# Patient Record
Sex: Female | Born: 1990 | Race: White | Hispanic: No | Marital: Single | Smoking: Never smoker
Health system: Southern US, Community
[De-identification: ages and names within clinical notes are randomized; demographics above are authoritative.]

## PROBLEM LIST (undated history)

## (undated) DIAGNOSIS — B1920 Unspecified viral hepatitis C without hepatic coma: Secondary | ICD-10-CM

## (undated) DIAGNOSIS — B0229 Other postherpetic nervous system involvement: Secondary | ICD-10-CM

## (undated) DIAGNOSIS — J189 Pneumonia, unspecified organism: Secondary | ICD-10-CM

## (undated) DIAGNOSIS — A0472 Enterocolitis due to Clostridium difficile, not specified as recurrent: Secondary | ICD-10-CM

## (undated) DIAGNOSIS — Z21 Asymptomatic human immunodeficiency virus [HIV] infection status: Secondary | ICD-10-CM

## (undated) DIAGNOSIS — F4310 Post-traumatic stress disorder, unspecified: Secondary | ICD-10-CM

## (undated) DIAGNOSIS — A5901 Trichomonal vulvovaginitis: Secondary | ICD-10-CM

## (undated) DIAGNOSIS — F1990 Other psychoactive substance use, unspecified, uncomplicated: Secondary | ICD-10-CM

## (undated) DIAGNOSIS — J101 Influenza due to other identified influenza virus with other respiratory manifestations: Secondary | ICD-10-CM

## (undated) DIAGNOSIS — Z9289 Personal history of other medical treatment: Secondary | ICD-10-CM

## (undated) DIAGNOSIS — B20 Human immunodeficiency virus [HIV] disease: Secondary | ICD-10-CM

## (undated) DIAGNOSIS — B182 Chronic viral hepatitis C: Secondary | ICD-10-CM

## (undated) HISTORY — DX: Influenza due to other identified influenza virus with other respiratory manifestations: J10.1

## (undated) HISTORY — DX: Other psychoactive substance use, unspecified, uncomplicated: F19.90

## (undated) HISTORY — PX: PERIPHERALLY INSERTED CENTRAL CATHETER INSERTION: SHX2221

## (undated) HISTORY — DX: Chronic viral hepatitis C: B18.2

## (undated) HISTORY — DX: Trichomonal vulvovaginitis: A59.01

## (undated) HISTORY — DX: Enterocolitis due to Clostridium difficile, not specified as recurrent: A04.72

## (undated) HISTORY — PX: TONSILLECTOMY AND ADENOIDECTOMY: SUR1326

## (undated) HISTORY — PX: APPENDECTOMY: SHX54

---
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
## 2009-04-09 ENCOUNTER — Encounter: Payer: Self-pay | Admitting: Obstetrics & Gynecology

## 2009-04-09 LAB — CONVERTED CEMR LAB
Basophils Relative: 0 % (ref 0–1)
Eosinophils Absolute: 0.1 10*3/uL (ref 0.0–0.7)
Eosinophils Relative: 2 % (ref 0–5)
HCT: 33.2 % — ABNORMAL LOW (ref 36.0–46.0)
Hepatitis B Surface Ag: NEGATIVE
Lymphs Abs: 0.6 10*3/uL — ABNORMAL LOW (ref 0.7–4.0)
MCHC: 32.8 g/dL (ref 30.0–36.0)
Monocytes Relative: 9 % (ref 3–12)
Neutro Abs: 3 10*3/uL (ref 1.7–7.7)
Neutrophils Relative %: 75 % (ref 43–77)

## 2009-04-10 ENCOUNTER — Ambulatory Visit: Payer: Self-pay | Admitting: Obstetrics & Gynecology

## 2009-05-06 ENCOUNTER — Ambulatory Visit (HOSPITAL_COMMUNITY): Admission: RE | Admit: 2009-05-06 | Discharge: 2009-05-06 | Payer: Self-pay | Admitting: Family Medicine

## 2009-06-06 ENCOUNTER — Ambulatory Visit: Payer: Self-pay | Admitting: Obstetrics and Gynecology

## 2009-06-27 ENCOUNTER — Encounter (INDEPENDENT_AMBULATORY_CARE_PROVIDER_SITE_OTHER): Payer: Self-pay | Admitting: *Deleted

## 2009-06-27 ENCOUNTER — Ambulatory Visit: Payer: Self-pay | Admitting: Family Medicine

## 2009-07-03 ENCOUNTER — Ambulatory Visit (HOSPITAL_COMMUNITY): Admission: RE | Admit: 2009-07-03 | Discharge: 2009-07-03 | Payer: Self-pay | Admitting: Obstetrics & Gynecology

## 2009-07-18 ENCOUNTER — Encounter: Payer: Self-pay | Admitting: Obstetrics & Gynecology

## 2009-07-18 ENCOUNTER — Ambulatory Visit: Payer: Self-pay | Admitting: Family Medicine

## 2009-09-12 ENCOUNTER — Encounter (INDEPENDENT_AMBULATORY_CARE_PROVIDER_SITE_OTHER): Payer: Self-pay | Admitting: *Deleted

## 2009-09-12 ENCOUNTER — Encounter: Payer: Self-pay | Admitting: Infectious Disease

## 2009-09-12 ENCOUNTER — Ambulatory Visit (HOSPITAL_COMMUNITY): Admission: RE | Admit: 2009-09-12 | Discharge: 2009-09-12 | Payer: Self-pay | Admitting: Obstetrics & Gynecology

## 2009-09-12 ENCOUNTER — Ambulatory Visit: Payer: Self-pay | Admitting: Obstetrics & Gynecology

## 2009-09-12 ENCOUNTER — Encounter: Payer: Self-pay | Admitting: Physician Assistant

## 2009-09-13 ENCOUNTER — Encounter: Payer: Self-pay | Admitting: Physician Assistant

## 2009-09-13 LAB — CONVERTED CEMR LAB
HIV 1 RNA Quant: 14100 copies/mL — ABNORMAL HIGH (ref <48)
HIV-1 RNA Quant, Log: 4.15 — ABNORMAL HIGH (ref <1.68)
Platelets: 252 10*3/uL (ref 150–400)
RDW: 18.2 % — ABNORMAL HIGH (ref 11.5–15.5)
WBC: 9.1 10*3/uL (ref 4.0–10.5)

## 2009-09-16 ENCOUNTER — Telehealth: Payer: Self-pay | Admitting: Infectious Disease

## 2009-09-17 ENCOUNTER — Ambulatory Visit: Payer: Self-pay | Admitting: Advanced Practice Midwife

## 2009-09-17 ENCOUNTER — Inpatient Hospital Stay (HOSPITAL_COMMUNITY): Admission: AD | Admit: 2009-09-17 | Discharge: 2009-09-18 | Payer: Self-pay | Admitting: Obstetrics & Gynecology

## 2009-09-19 ENCOUNTER — Ambulatory Visit: Payer: Self-pay | Admitting: Obstetrics & Gynecology

## 2009-09-19 LAB — CONVERTED CEMR LAB: Chlamydia, Swab/Urine, PCR: NEGATIVE

## 2009-09-26 ENCOUNTER — Telehealth: Payer: Self-pay | Admitting: Infectious Disease

## 2009-09-26 ENCOUNTER — Encounter: Payer: Self-pay | Admitting: Infectious Disease

## 2009-09-26 DIAGNOSIS — B20 Human immunodeficiency virus [HIV] disease: Secondary | ICD-10-CM

## 2009-10-03 ENCOUNTER — Inpatient Hospital Stay (HOSPITAL_COMMUNITY): Admission: RE | Admit: 2009-10-03 | Discharge: 2009-10-06 | Payer: Self-pay | Admitting: Obstetrics & Gynecology

## 2009-10-03 ENCOUNTER — Ambulatory Visit: Payer: Self-pay | Admitting: Obstetrics & Gynecology

## 2009-10-31 ENCOUNTER — Encounter (INDEPENDENT_AMBULATORY_CARE_PROVIDER_SITE_OTHER): Payer: Self-pay | Admitting: *Deleted

## 2010-02-23 ENCOUNTER — Encounter: Payer: Self-pay | Admitting: Obstetrics and Gynecology

## 2010-03-04 NOTE — Progress Notes (Signed)
Summary: pregnant  Phone Note Other Incoming   Summary of Call: Pt with HIV that was managed by PCP in [place] with atripla, then put on trizivir and then truvada ALONE since December of 2010. She is no pregnant and in the final trimester with due date in September. Her viral load was 14k when checkec by ob gyn. I aedkasked gyn to start combivir and [place] and have arranged for her to be seen in [place] next THursday. We will likely need to add raltegravir but I will let her adjust to combivir kaletra first (and this oculd be sufficient to control virus depending on exten of R present_ Initial call taken by: [name] MD,  September 17, 2009 5:38 PM

## 2010-03-04 NOTE — Progress Notes (Signed)
Summary: PT NEEDS GENOTYPE ADDED ON  Phone Note Outgoing Call   Call placed by: [name] MD,  September 26, 2009 12:39 PM Details for Reason: PT NEEDS GENOTYPE Summary of Call: PT WAS SUPPOSED TO HAVE GENOTYPE RUN ON BLOOD DRAWN IN OB IN [place] BUT WAS NOT DONE. I AM SEEING HER IN [place]. SHE IS PRGENANT AND IS ON INTIIAL COMBIVIR KALETRA HAVING BEEN INAPPROPRIATELY LEFT ON TRUVADA ALONE FOR HALF A YEAR. CAN WE ADD GENOTYPE TO BLOOD DRAWN IN PAST TWO WEEKS (SHE HAS A FROZEN SAMPLE AFTER HIV QUANT WAS RUN) AND I AM HAVING TROUBLE ORDERING IT OVER THE PHION Initial call taken by: [name] MD,  September 26, 2009 12:40 PM  New Problems: PREGNANCY, HIGH RISK (ICD-V23.9) HIV INFECTION (ICD-042)   New Problems: PREGNANCY, HIGH RISK (ICD-V23.9) HIV INFECTION (ICD-042)

## 2010-03-04 NOTE — Miscellaneous (Signed)
Summary: Flowsheet updated  Clinical Lists Changes  Observations: Added new observation of PLATELETK/UL: 252 K/uL (09/12/2009 11:07) Added new observation of WBC COUNT: 9.1 10*3/microliter (09/12/2009 11:07) Added new observation of HGB: 9.4 g/dL (91/47/8295 62:13)

## 2010-04-17 LAB — CROSSMATCH: Antibody Screen: NEGATIVE

## 2010-04-17 LAB — CBC
MCV: 72 fL — ABNORMAL LOW (ref 78.0–100.0)
Platelets: 223 10*3/uL (ref 150–400)
RDW: 21.3 % — ABNORMAL HIGH (ref 11.5–15.5)
WBC: 9.6 10*3/uL (ref 4.0–10.5)

## 2010-04-17 LAB — ABO/RH: ABO/RH(D): O POS

## 2010-04-17 LAB — COMPREHENSIVE METABOLIC PANEL
Albumin: 2 g/dL — ABNORMAL LOW (ref 3.5–5.2)
Alkaline Phosphatase: 99 U/L (ref 39–117)
BUN: 11 mg/dL (ref 6–23)
Potassium: 3.9 mEq/L (ref 3.5–5.1)
Total Protein: 5.1 g/dL — ABNORMAL LOW (ref 6.0–8.3)

## 2010-04-17 LAB — T-HELPER CELLS (CD4) COUNT (NOT AT ARMC)
CD4 % Helper T Cell: 46 % (ref 33–55)
CD4 T Cell Abs: 260 uL — ABNORMAL LOW (ref 400–2700)

## 2010-04-18 LAB — POCT URINALYSIS DIPSTICK
Bilirubin Urine: NEGATIVE
Bilirubin Urine: NEGATIVE
Glucose, UA: NEGATIVE mg/dL
Hgb urine dipstick: NEGATIVE
Ketones, ur: NEGATIVE mg/dL
Nitrite: NEGATIVE
Nitrite: NEGATIVE
pH: 7.5 (ref 5.0–8.0)

## 2010-04-18 LAB — MRSA CULTURE

## 2010-04-18 LAB — CBC
HCT: 27 % — ABNORMAL LOW (ref 36.0–46.0)
Hemoglobin: 9.3 g/dL — ABNORMAL LOW (ref 12.0–15.0)
MCV: 72.3 fL — ABNORMAL LOW (ref 78.0–100.0)
RBC: 3.73 MIL/uL — ABNORMAL LOW (ref 3.87–5.11)
WBC: 7.4 10*3/uL (ref 4.0–10.5)

## 2010-04-20 LAB — POCT URINALYSIS DIP (DEVICE)
Ketones, ur: NEGATIVE mg/dL
Nitrite: NEGATIVE
Protein, ur: 100 mg/dL — AB

## 2010-04-21 LAB — POCT URINALYSIS DIP (DEVICE)
Glucose, UA: NEGATIVE mg/dL
Ketones, ur: NEGATIVE mg/dL
Specific Gravity, Urine: 1.025 (ref 1.005–1.030)
Urobilinogen, UA: 1 mg/dL (ref 0.0–1.0)

## 2010-04-22 LAB — POCT URINALYSIS DIP (DEVICE)
Bilirubin Urine: NEGATIVE
Glucose, UA: NEGATIVE mg/dL
Hgb urine dipstick: NEGATIVE
Nitrite: NEGATIVE

## 2010-04-28 LAB — POCT URINALYSIS DIP (DEVICE)
Bilirubin Urine: NEGATIVE
Glucose, UA: NEGATIVE mg/dL
Ketones, ur: NEGATIVE mg/dL
Nitrite: NEGATIVE
pH: 6 (ref 5.0–8.0)

## 2010-09-22 IMAGING — US US OB COMP +14 WK
1 series · 14 of 28 positions shown · non-contrast
Comparison: none

OBSTETRICAL ULTRASOUND:
 This ultrasound exam was performed in the [HOSPITAL] Ultrasound Department.  The OB US report was generated in the AS system, and faxed to the ordering physician.  This report is also available in [HOSPITAL]?s AccessANYware and in [REDACTED] PACS.

[Series 1: us ob comp +14 wk · 0.21mm/px · 14 of 53 slices shown]
[im 2/53]
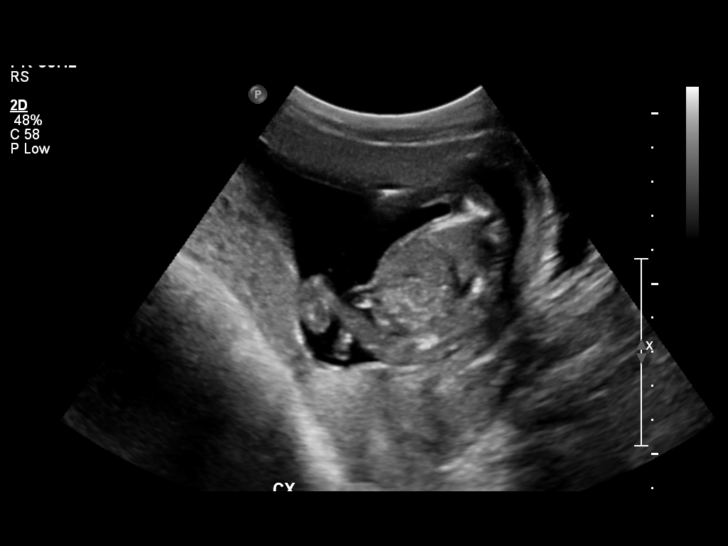
[im 6/53]
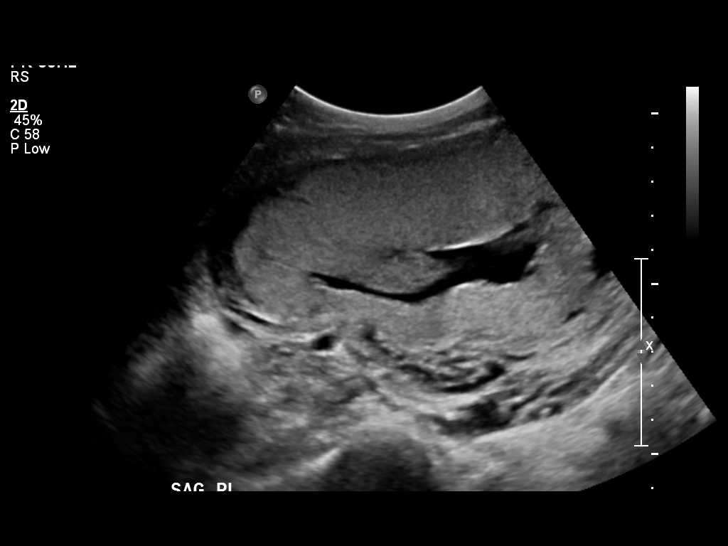
[im 10/53]
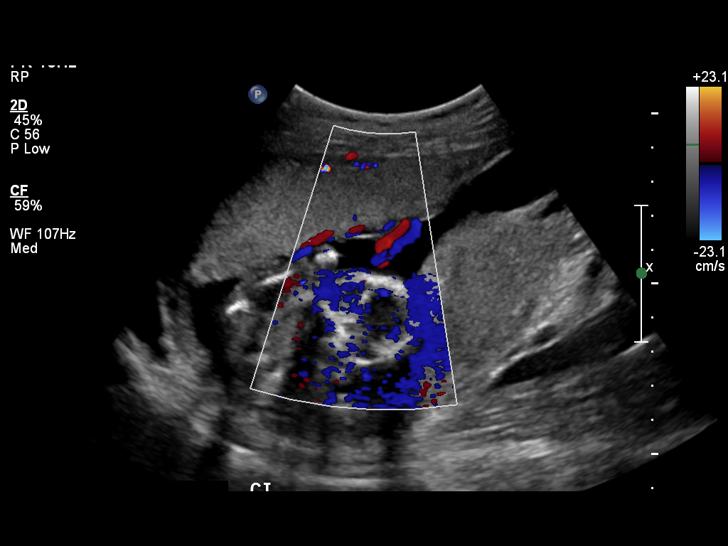
[im 14/53]
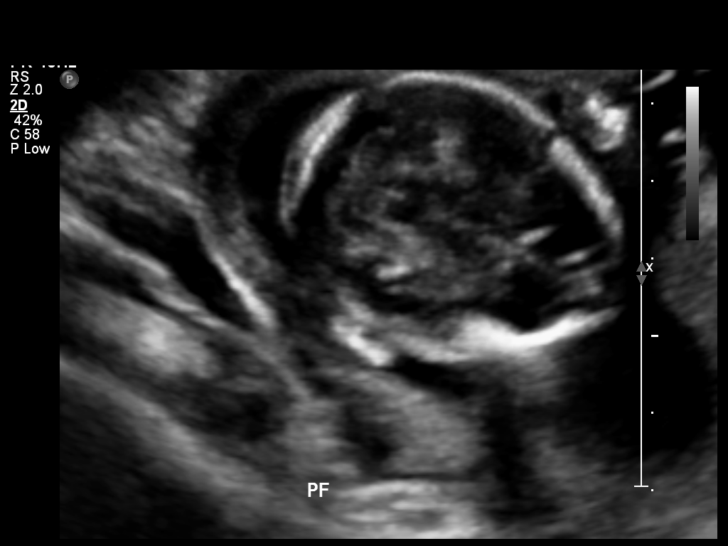
[im 18/53]
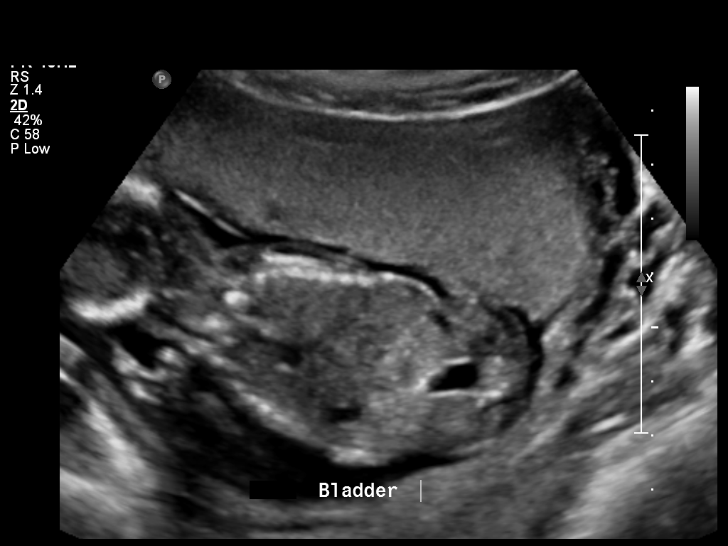
[im 22/53]
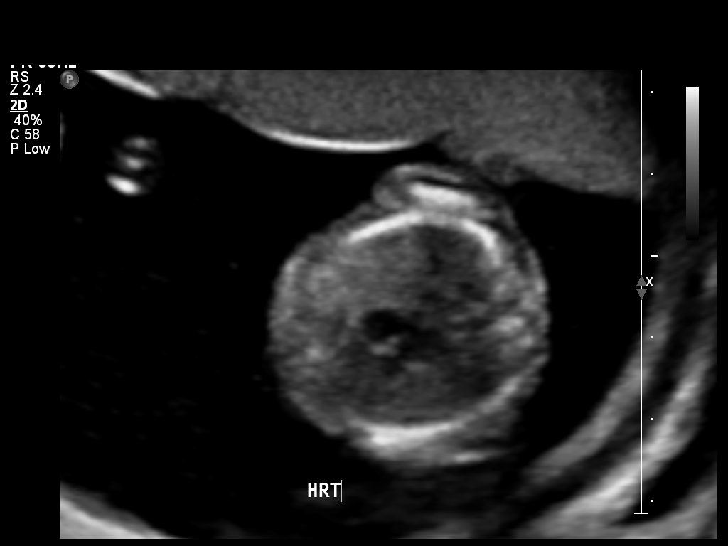
[im 26/53]
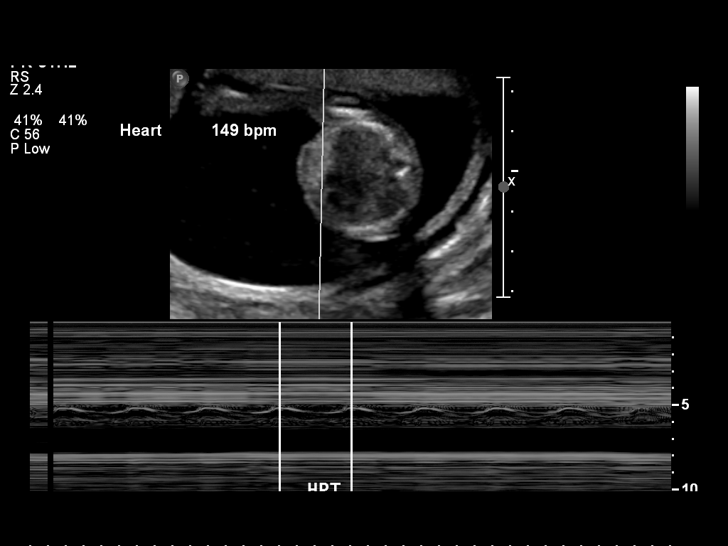
[im 29/53]
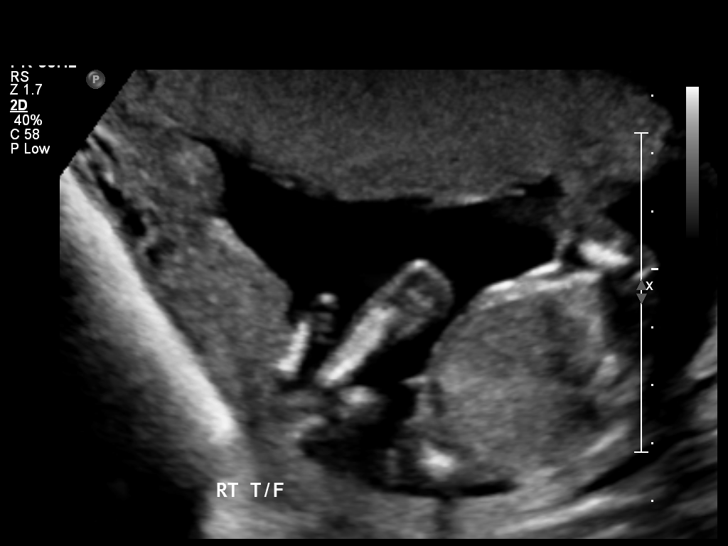
[im 33/53]
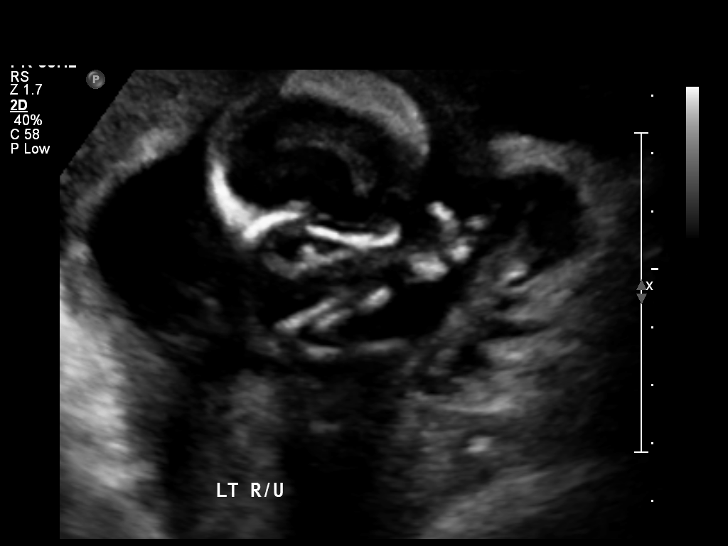
[im 37/53]
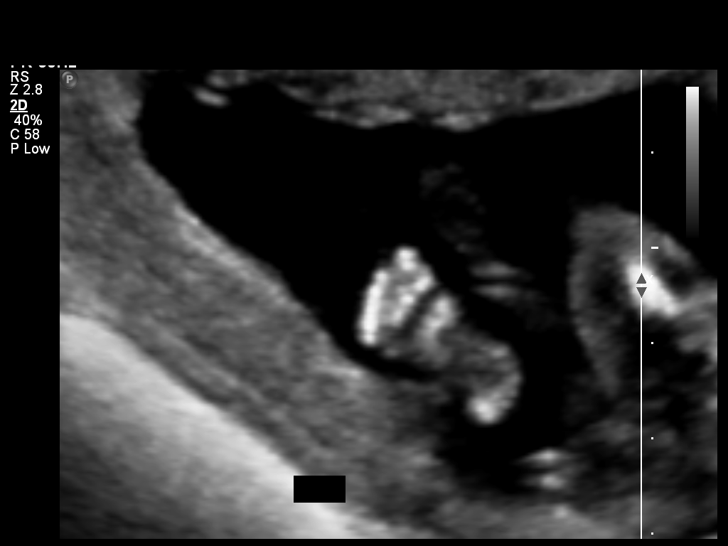
[im 41/53]
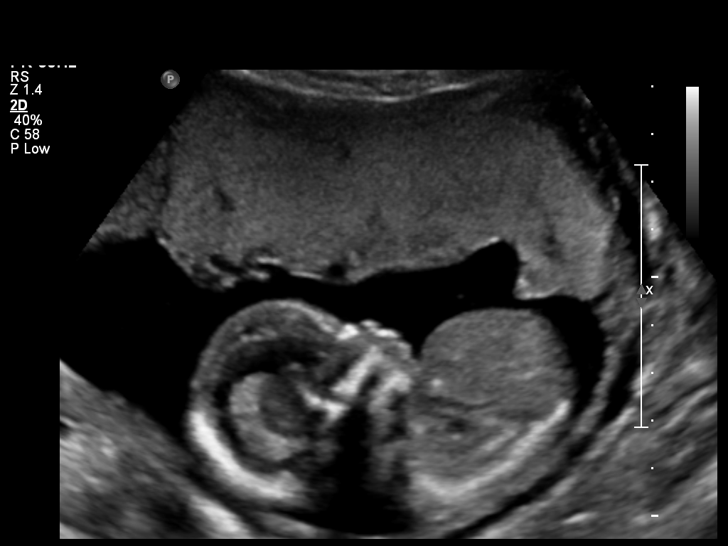
[im 45/53]
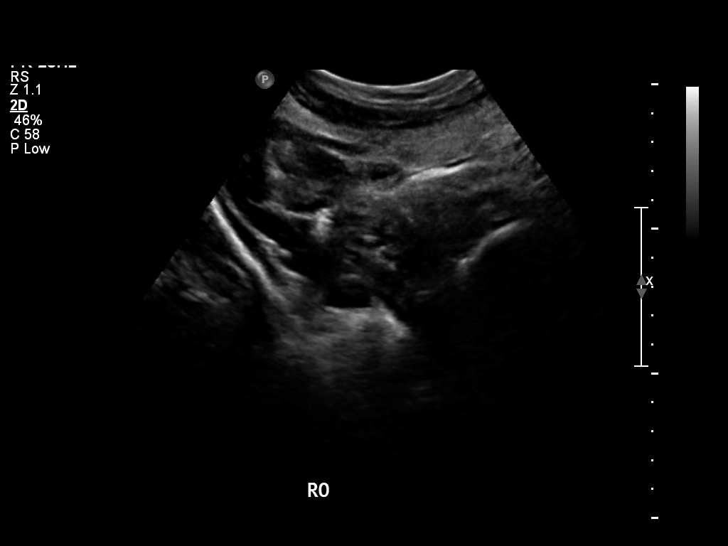
[im 49/53]
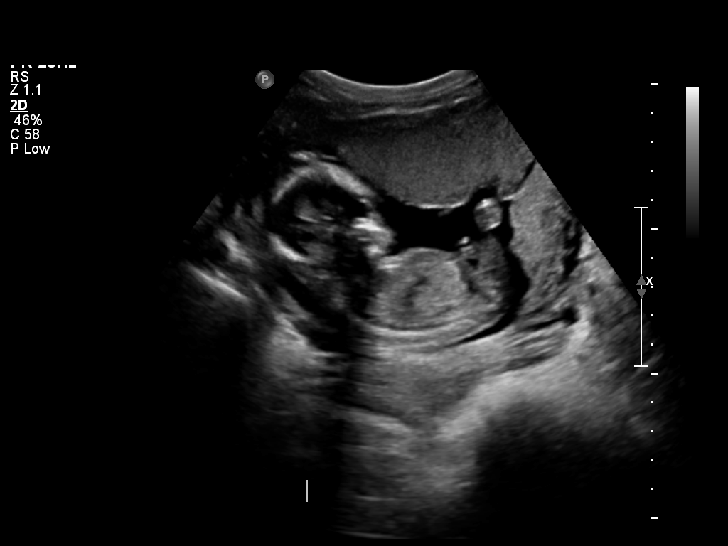
[im 53/53]
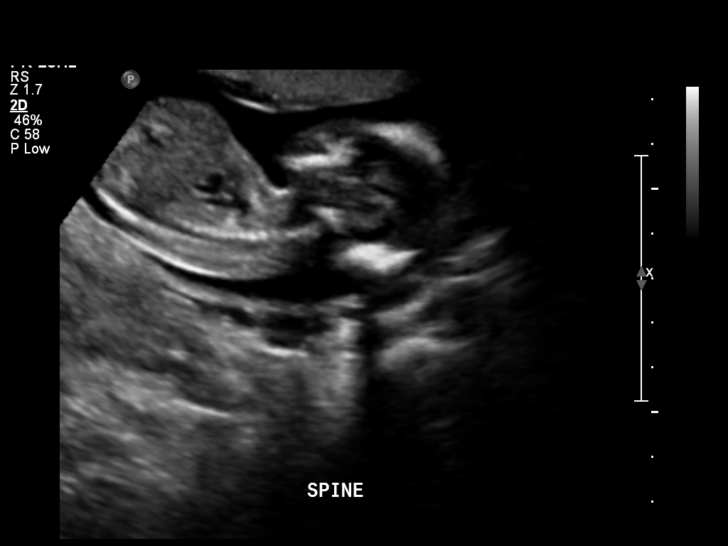

[14 of 28 positions shown; findings below may reference images not displayed]

IMPRESSION: See AS Obstetric US report.

## 2011-03-03 DIAGNOSIS — O3421 Maternal care for scar from previous cesarean delivery: Secondary | ICD-10-CM | POA: Insufficient documentation

## 2011-03-03 DIAGNOSIS — Z98891 History of uterine scar from previous surgery: Secondary | ICD-10-CM | POA: Insufficient documentation

## 2011-03-03 DIAGNOSIS — O0930 Supervision of pregnancy with insufficient antenatal care, unspecified trimester: Secondary | ICD-10-CM | POA: Insufficient documentation

## 2011-03-03 DIAGNOSIS — F1920 Other psychoactive substance dependence, uncomplicated: Secondary | ICD-10-CM | POA: Insufficient documentation

## 2012-08-08 DIAGNOSIS — R05 Cough: Secondary | ICD-10-CM | POA: Insufficient documentation

## 2013-04-19 DIAGNOSIS — B20 Human immunodeficiency virus [HIV] disease: Secondary | ICD-10-CM

## 2013-06-22 ENCOUNTER — Telehealth: Payer: Self-pay | Admitting: Infectious Disease

## 2013-06-22 NOTE — Telephone Encounter (Signed)
[facility] when she was pregnant. [facility].  Trying to link to those records

## 2014-07-06 DIAGNOSIS — B20 Human immunodeficiency virus [HIV] disease: Secondary | ICD-10-CM

## 2014-09-17 DIAGNOSIS — B20 Human immunodeficiency virus [HIV] disease: Secondary | ICD-10-CM

## 2014-11-19 DIAGNOSIS — B20 Human immunodeficiency virus [HIV] disease: Secondary | ICD-10-CM

## 2015-01-29 DIAGNOSIS — B20 Human immunodeficiency virus [HIV] disease: Secondary | ICD-10-CM

## 2015-03-04 DIAGNOSIS — B20 Human immunodeficiency virus [HIV] disease: Secondary | ICD-10-CM

## 2015-03-15 ENCOUNTER — Encounter (HOSPITAL_COMMUNITY): Payer: Self-pay

## 2015-03-15 ENCOUNTER — Emergency Department (HOSPITAL_COMMUNITY): Payer: Medicaid Other

## 2015-03-15 ENCOUNTER — Inpatient Hospital Stay (HOSPITAL_COMMUNITY)
Admission: EM | Admit: 2015-03-15 | Discharge: 2015-03-18 | DRG: 975 | Disposition: A | Discharge status: Home / Self Care | Payer: Medicaid Other | Attending: Internal Medicine | Admitting: Internal Medicine

## 2015-03-15 DIAGNOSIS — R740 Nonspecific elevation of levels of transaminase and lactic acid dehydrogenase [LDH]: Secondary | ICD-10-CM | POA: Diagnosis present

## 2015-03-15 DIAGNOSIS — J189 Pneumonia, unspecified organism: Secondary | ICD-10-CM | POA: Insufficient documentation

## 2015-03-15 DIAGNOSIS — B59 Pneumocystosis: Secondary | ICD-10-CM | POA: Diagnosis present

## 2015-03-15 DIAGNOSIS — R197 Diarrhea, unspecified: Secondary | ICD-10-CM | POA: Diagnosis not present

## 2015-03-15 DIAGNOSIS — B1920 Unspecified viral hepatitis C without hepatic coma: Secondary | ICD-10-CM | POA: Diagnosis present

## 2015-03-15 DIAGNOSIS — Z8249 Family history of ischemic heart disease and other diseases of the circulatory system: Secondary | ICD-10-CM | POA: Diagnosis not present

## 2015-03-15 DIAGNOSIS — N179 Acute kidney failure, unspecified: Secondary | ICD-10-CM | POA: Diagnosis present

## 2015-03-15 DIAGNOSIS — Z6820 Body mass index (BMI) 20.0-20.9, adult: Secondary | ICD-10-CM | POA: Diagnosis not present

## 2015-03-15 DIAGNOSIS — L989 Disorder of the skin and subcutaneous tissue, unspecified: Secondary | ICD-10-CM | POA: Diagnosis present

## 2015-03-15 DIAGNOSIS — B20 Human immunodeficiency virus [HIV] disease: Secondary | ICD-10-CM | POA: Diagnosis present

## 2015-03-15 DIAGNOSIS — E440 Moderate protein-calorie malnutrition: Secondary | ICD-10-CM | POA: Diagnosis present

## 2015-03-15 DIAGNOSIS — Z881 Allergy status to other antibiotic agents status: Secondary | ICD-10-CM

## 2015-03-15 DIAGNOSIS — D6959 Other secondary thrombocytopenia: Secondary | ICD-10-CM | POA: Diagnosis present

## 2015-03-15 DIAGNOSIS — R0902 Hypoxemia: Secondary | ICD-10-CM | POA: Diagnosis present

## 2015-03-15 DIAGNOSIS — Z21 Asymptomatic human immunodeficiency virus [HIV] infection status: Secondary | ICD-10-CM | POA: Diagnosis present

## 2015-03-15 DIAGNOSIS — B1710 Acute hepatitis C without hepatic coma: Secondary | ICD-10-CM | POA: Diagnosis not present

## 2015-03-15 DIAGNOSIS — Y95 Nosocomial condition: Secondary | ICD-10-CM | POA: Diagnosis present

## 2015-03-15 DIAGNOSIS — B0229 Other postherpetic nervous system involvement: Secondary | ICD-10-CM | POA: Diagnosis present

## 2015-03-15 DIAGNOSIS — Z7722 Contact with and (suspected) exposure to environmental tobacco smoke (acute) (chronic): Secondary | ICD-10-CM | POA: Diagnosis present

## 2015-03-15 DIAGNOSIS — Z79899 Other long term (current) drug therapy: Secondary | ICD-10-CM | POA: Diagnosis not present

## 2015-03-15 HISTORY — DX: Other postherpetic nervous system involvement: B02.29

## 2015-03-15 HISTORY — DX: Human immunodeficiency virus (HIV) disease: B20

## 2015-03-15 HISTORY — DX: Asymptomatic human immunodeficiency virus (hiv) infection status: Z21

## 2015-03-15 HISTORY — DX: Pneumonia, unspecified organism: J18.9

## 2015-03-15 HISTORY — DX: Post-traumatic stress disorder, unspecified: F43.10

## 2015-03-15 HISTORY — DX: Unspecified viral hepatitis C without hepatic coma: B19.20

## 2015-03-15 HISTORY — DX: Personal history of other medical treatment: Z92.89

## 2015-03-15 LAB — COMPREHENSIVE METABOLIC PANEL
ALK PHOS: 45 U/L (ref 38–126)
ALT: 17 U/L (ref 14–54)
ANION GAP: 15 (ref 5–15)
AST: 52 U/L — ABNORMAL HIGH (ref 15–41)
Albumin: 3 g/dL — ABNORMAL LOW (ref 3.5–5.0)
BILIRUBIN TOTAL: 1 mg/dL (ref 0.3–1.2)
BUN: 11 mg/dL (ref 6–20)
CALCIUM: 8.7 mg/dL — AB (ref 8.9–10.3)
CO2: 25 mmol/L (ref 22–32)
Chloride: 97 mmol/L — ABNORMAL LOW (ref 101–111)
Creatinine, Ser: 1.02 mg/dL — ABNORMAL HIGH (ref 0.44–1.00)
Glucose, Bld: 80 mg/dL (ref 65–99)
Potassium: 3.9 mmol/L (ref 3.5–5.1)
SODIUM: 137 mmol/L (ref 135–145)
TOTAL PROTEIN: 7.1 g/dL (ref 6.5–8.1)

## 2015-03-15 LAB — MRSA PCR SCREENING: MRSA BY PCR: NEGATIVE

## 2015-03-15 LAB — CBC WITH DIFFERENTIAL/PLATELET
BASOS PCT: 1 %
Basophils Absolute: 0 10*3/uL (ref 0.0–0.1)
EOS ABS: 0 10*3/uL (ref 0.0–0.7)
EOS PCT: 0 %
HCT: 31.4 % — ABNORMAL LOW (ref 36.0–46.0)
HEMOGLOBIN: 10.2 g/dL — AB (ref 12.0–15.0)
LYMPHS PCT: 32 %
Lymphs Abs: 0.7 10*3/uL (ref 0.7–4.0)
MCH: 25.3 pg — AB (ref 26.0–34.0)
MCHC: 32.5 g/dL (ref 30.0–36.0)
MCV: 77.9 fL — AB (ref 78.0–100.0)
Monocytes Absolute: 0.2 10*3/uL (ref 0.1–1.0)
Monocytes Relative: 9 %
NEUTROS ABS: 1.2 10*3/uL — AB (ref 1.7–7.7)
Neutrophils Relative %: 58 %
Platelets: 84 10*3/uL — ABNORMAL LOW (ref 150–400)
RBC: 4.03 MIL/uL (ref 3.87–5.11)
RDW: 22.2 % — ABNORMAL HIGH (ref 11.5–15.5)
WBC: 2.1 10*3/uL — ABNORMAL LOW (ref 4.0–10.5)

## 2015-03-15 LAB — INFLUENZA PANEL BY PCR (TYPE A & B)
H1N1 flu by pcr: NOT DETECTED
INFLAPCR: NEGATIVE
Influenza B By PCR: NEGATIVE

## 2015-03-15 LAB — LACTATE DEHYDROGENASE: LDH: 287 U/L — AB (ref 98–192)

## 2015-03-15 MED ORDER — ALBUTEROL SULFATE (2.5 MG/3ML) 0.083% IN NEBU: 2.5000 mg | INHALATION_SOLUTION | RESPIRATORY_TRACT | Status: DC | PRN

## 2015-03-15 MED ORDER — PREGABALIN 75 MG PO CAPS
75.0000 mg | ORAL_CAPSULE | Freq: Two times a day (BID) | ORAL | Status: DC
  Administered 2015-03-15 – 2015-03-18 (×6): 75 mg via ORAL
  Filled 2015-03-15 (×6): qty 1

## 2015-03-15 MED ORDER — ONDANSETRON HCL 4 MG/2ML IJ SOLN: 4.0000 mg | Freq: Four times a day (QID) | INTRAMUSCULAR | Status: DC | PRN

## 2015-03-15 MED ORDER — ENSURE ENLIVE PO LIQD: 237.0000 mL | Freq: Two times a day (BID) | ORAL | Status: DC

## 2015-03-15 MED ORDER — ONDANSETRON HCL 4 MG PO TABS: 4.0000 mg | ORAL_TABLET | Freq: Four times a day (QID) | ORAL | Status: DC | PRN

## 2015-03-15 MED ORDER — DAPSONE 100 MG PO TABS
100.0000 mg | ORAL_TABLET | Freq: Every day | ORAL | Status: DC
  Administered 2015-03-15: 100 mg via ORAL
  Filled 2015-03-15 (×2): qty 1

## 2015-03-15 MED ORDER — SODIUM CHLORIDE 0.9% FLUSH
10.0000 mL | INTRAVENOUS | Status: DC | PRN
  Administered 2015-03-16: 10 mL
  Filled 2015-03-15: qty 40

## 2015-03-15 MED ORDER — LAMIVUDINE-ZIDOVUDINE 150-300 MG PO TABS
1.0000 | ORAL_TABLET | ORAL | Status: AC
  Administered 2015-03-15: 1 via ORAL
  Filled 2015-03-15: qty 1

## 2015-03-15 MED ORDER — VANCOMYCIN HCL IN DEXTROSE 1-5 GM/200ML-% IV SOLN
1000.0000 mg | Freq: Once | INTRAVENOUS | Status: AC
  Administered 2015-03-15: 1000 mg via INTRAVENOUS
  Filled 2015-03-15: qty 200

## 2015-03-15 MED ORDER — ACETAMINOPHEN 325 MG PO TABS
650.0000 mg | ORAL_TABLET | Freq: Four times a day (QID) | ORAL | Status: DC | PRN
  Administered 2015-03-15: 650 mg via ORAL
  Filled 2015-03-15: qty 2

## 2015-03-15 MED ORDER — LAMIVUDINE-ZIDOVUDINE 150-300 MG PO TABS
1.0000 | ORAL_TABLET | Freq: Two times a day (BID) | ORAL | Status: DC
  Administered 2015-03-16 – 2015-03-18 (×6): 1 via ORAL
  Filled 2015-03-15 (×9): qty 1

## 2015-03-15 MED ORDER — DOLUTEGRAVIR SODIUM 50 MG PO TABS
50.0000 mg | ORAL_TABLET | Freq: Two times a day (BID) | ORAL | Status: DC
  Administered 2015-03-16 – 2015-03-18 (×6): 50 mg via ORAL
  Filled 2015-03-15 (×6): qty 1

## 2015-03-15 MED ORDER — SODIUM CHLORIDE 0.9 % IV SOLN
INTRAVENOUS | Status: DC
  Administered 2015-03-15 – 2015-03-16 (×2): via INTRAVENOUS

## 2015-03-15 MED ORDER — ACETAMINOPHEN 650 MG RE SUPP: 650.0000 mg | Freq: Four times a day (QID) | RECTAL | Status: DC | PRN

## 2015-03-15 MED ORDER — DEXTROSE 5 % IV SOLN
1.0000 g | Freq: Three times a day (TID) | INTRAVENOUS | Status: DC
  Administered 2015-03-15 – 2015-03-18 (×9): 1 g via INTRAVENOUS
  Filled 2015-03-15 (×11): qty 1

## 2015-03-15 MED ORDER — SODIUM CHLORIDE 0.9 % IV BOLUS (SEPSIS)
1000.0000 mL | Freq: Once | INTRAVENOUS | Status: AC
  Administered 2015-03-15: 1000 mL via INTRAVENOUS

## 2015-03-15 MED ORDER — DARUNAVIR-COBICISTAT 800-150 MG PO TABS
1.0000 | ORAL_TABLET | Freq: Every day | ORAL | Status: DC
  Administered 2015-03-15 – 2015-03-18 (×4): 1 via ORAL
  Filled 2015-03-15 (×5): qty 1

## 2015-03-15 MED ORDER — VANCOMYCIN HCL IN DEXTROSE 750-5 MG/150ML-% IV SOLN
750.0000 mg | Freq: Two times a day (BID) | INTRAVENOUS | Status: DC
  Filled 2015-03-15 (×2): qty 150

## 2015-03-15 MED ORDER — DIPHENHYDRAMINE HCL 25 MG PO CAPS
50.0000 mg | ORAL_CAPSULE | Freq: Once | ORAL | Status: AC
  Administered 2015-03-15: 50 mg via ORAL
  Filled 2015-03-15: qty 2

## 2015-03-15 MED ORDER — DOLUTEGRAVIR SODIUM 50 MG PO TABS
50.0000 mg | ORAL_TABLET | ORAL | Status: AC
  Administered 2015-03-15: 50 mg via ORAL
  Filled 2015-03-15: qty 1

## 2015-03-15 MED ORDER — DEXTROSE 5 % IV SOLN
1.0000 g | Freq: Once | INTRAVENOUS | Status: AC
  Administered 2015-03-15: 1 g via INTRAVENOUS
  Filled 2015-03-15 (×2): qty 1

## 2015-03-15 NOTE — ED Notes (Signed)
Attempted report 

## 2015-03-15 NOTE — ED Notes (Signed)
MD at the bedside attempting IV. Unable to access

## 2015-03-15 NOTE — ED Notes (Signed)
Pt returned from X-ray.  

## 2015-03-15 NOTE — Progress Notes (Signed)
Pt arrived to unit via stretcher from ED. Pt was oriented to room and call bell system.  Provider was notified. Pt in no apparent distress at this time. Will continue to monitor pt.

## 2015-03-15 NOTE — ED Notes (Signed)
IV team at bedside to place PICC line.

## 2015-03-15 NOTE — Progress Notes (Addendum)
Pt with complaint of face flushing/burning and vein popping out of forehead. IV abx was stopped and vein went away. Paged provider on call. Awaiting further orders. RN to stop vanc, give pt benadryl, and swab for MRSA. Will continue to monitor pt.

## 2015-03-15 NOTE — ED Notes (Signed)
Per PT, Pt went to [facility] with cough and congestion x2 weeks. Pt reports being diagnosed with pneumonia. Pt was stuck multiple times and left AMA after multiple unsuccessful sticks. Pt called MD yesterday and was advised to come to [facility].

## 2015-03-15 NOTE — Consult Note (Signed)
Pharmacy Antibiotic Note  Frances Ball is a 25 y.o. female admitted on 03/15/2015 with pneumonia.  Pharmacy has been consulted for vancomycin and cefepime dosing. Renal function wnl.  Goal of therapy: Vancomycin trough 15-20  Plan: 1) Vancomycin 1g IV x 1 then  IV q12 2) Cefepime 1g IV q8 3) Follow renal function, cultures, LOT, level if needed  Height:  (157.5 cm) Weight: 118 lb (53.524 kg) IBW/kg (Calculated) : 50.1  Temp (24hrs), Avg:98.6 F (37 C), Min:98.6 F (37 C), Max:98.6 F (37 C)  Labs pending.  Allergies  Allergen Reactions  . Levaquin [Levofloxacin In D5w] Shortness Of Breath and Rash  . Zithromax [Azithromycin] Shortness Of Breath, Swelling and Other (See Comments)    Pt reports tightness in skin with redness   . Bactrim [Sulfamethoxazole-Trimethoprim] Swelling    Antimicrobials this admission: 2/10 Vancomycin >> 2/10 Cefepime >>  Dose adjustments this admission: N/A  Microbiology results: N/A  Thank you for allowing pharmacy to be a part of this patient's care.  Fredrik Rigger 03/15/2015 10:08 AM

## 2015-03-15 NOTE — ED Notes (Signed)
Patient ambulated to the new room. Verbalizes no needs. Pt comfortable

## 2015-03-15 NOTE — ED Notes (Signed)
MD made aware of the plan of care.

## 2015-03-15 NOTE — Progress Notes (Signed)
Peripherally Inserted Central Catheter/Midline Placement  The IV Nurse has discussed with the patient and/or persons authorized to consent for the patient, the purpose of this procedure and the potential benefits and risks involved with this procedure.  The benefits include less needle sticks, lab draws from the catheter and patient may be discharged home with the catheter.  Risks include, but not limited to, infection, bleeding, blood clot (thrombus formation), and puncture of an artery; nerve damage and irregular heat beat.  Alternatives to this procedure were also discussed.  PICC/Midline Placement Documentation  PICC Single Lumen 03/15/15 PICC Right Basilic 38 cm 3 cm (Active)  Indication for Insertion or Continuance of Line Poor Vasculature-patient has had multiple peripheral attempts or PIVs lasting less than 24 hours 03/15/2015  3:00 PM  Exposed Catheter (cm) 3 cm 03/15/2015  3:00 PM  Dressing Change Due 03/22/15 03/15/2015  3:00 PM       Stacie Glaze Horton 03/15/2015, 3:06 PM

## 2015-03-15 NOTE — ED Provider Notes (Signed)
CSN: [id number]     Arrival date & time 03/15/15  0813 History   First MD Initiated Contact with Patient 03/15/15 [phone]     Chief Complaint  Patient presents with  . Cough     (Consider location/radiation/quality/duration/timing/severity/associated sxs/prior Treatment) HPI   Frances Ball is a 25 y.o. female with PMH significant for HIV who presents with gradual onset, constant, productive cough x 2 weeks now requiring 4L oxygen via [place] (84% on RA upon arrival).  Patient reports she was seen at [facility] yesterday and diagnosed with PNA and was to be admitted for IV abx.  However, after many unsuccessful IV attempts, she left AMA.  No aggravating or modifying factors.  Associated symptoms include congestion, fever (max T 102), myalgias, and SOB.  Denies neck stiffness, N/V, CP, or abdominal pain.  She does not smoke.    Past Medical History  Diagnosis Date  . HIV (human immunodeficiency virus infection) [facility])    Past Surgical History  Procedure Laterality Date  . Cesarean section    . Tonsillectomy and adenoidectomy    . Appendectomy     No family history on file. Social History  Substance Use Topics  . Smoking status: Passive Smoke Exposure - Never Smoker  . Smokeless tobacco: Never Used  . Alcohol Use: No   OB History    No data available     Review of Systems All other systems negative unless otherwise stated in HPI    Allergies  Levaquin; Zithromax; and Bactrim  Home Medications   Prior to Admission medications   Medication Sig Start Date End Date Taking? Authorizing Provider  COMBIVIR 150-300 MG tablet Take 1 tablet by mouth 2 (two) times daily. 02/28/15  Yes Historical Provider, MD  dapsone 100 MG tablet Take 100 mg by mouth. 11/16/12  Yes Historical Provider, MD  PREZCOBIX 800-150 MG tablet Take 1 tablet by mouth daily. 02/28/15  Yes Historical Provider, MD  TIVICAY 50 MG tablet Take 1 tablet by mouth 2 (two) times daily. 02/28/15  Yes Historical Provider, MD    BP 107/76 mmHg  Pulse 110  Temp(Src) 98.6 F (37 C) (Oral)  Resp 15  Ht  (1.575 m)  Wt 53.524 kg  BMI 21.58 kg/m2  SpO2 95%  LMP 02/15/2015 Physical Exam  Constitutional: She is oriented to person, place, and time. She appears well-developed and well-nourished.  Non-toxic appearance. She does not have a sickly appearance. She does not appear ill.  HENT:  Head: Normocephalic and atraumatic.  Mouth/Throat: Oropharynx is clear and moist.  Eyes: Conjunctivae are normal. Pupils are equal, round, and reactive to light.  Neck: Normal range of motion. Neck supple.  Cardiovascular: Normal rate, regular rhythm and normal heart sounds.   No murmur heard. Pulmonary/Chest: Effort normal. No accessory muscle usage or stridor. No respiratory distress. She has no wheezes. She has no rhonchi. She has rales.  Patient requiring 4L oxygen via .  Abdominal: Soft. Bowel sounds are normal. She exhibits no distension. There is no tenderness.  Musculoskeletal: Normal range of motion.  Lymphadenopathy:    She has no cervical adenopathy.  Neurological: She is alert and oriented to person, place, and time.  Speech clear without dysarthria.  Skin: Skin is warm and dry.  Psychiatric: She has a normal mood and affect. Her behavior is normal.    ED Course  Procedures (including critical care time) Labs Review Labs Reviewed  CBC WITH DIFFERENTIAL/PLATELET  COMPREHENSIVE METABOLIC PANEL  LACTATE DEHYDROGENASE  Imaging Review Dg Chest 2 View  03/15/2015  CLINICAL DATA:  Cough and pneumonia for 2 weeks. EXAM: CHEST  2 VIEW COMPARISON:  03/14/2015 FINDINGS: Cardiomediastinal silhouette is normal. Mediastinal contours appear intact. There is no evidence of pleural effusion or pneumothorax. There is persistent patchy left lower lobe airspace consolidation. Smaller isolated areas of consolidation are also seen in the right mid lung. Osseous structures are without acute abnormality. Soft tissues are  grossly normal. IMPRESSION: Persist the left lower lobe airspace consolidation. Smaller isolated areas of airspace consolidation are also seen in the right mid lung, suggestive of multifocal pneumonia. Electronically Signed   By: Ted Mcalpine M.D.   On: 03/15/2015 09:29   I have personally reviewed and evaluated these images and lab results as part of my medical decision-making.   EKG Interpretation None      MDM   Final diagnoses:  HCAP (healthcare-associated pneumonia)    Patient with HIV presents with cough x 2 weeks and new oxygen requirement.  VSS, NAD.  Mild crackles throughout all lung fields.  Will obtain CXR, CBC, CMP, and LDH.   CXR remarkable for LLL airspace consolidation and smaller isolated areas of consolidation in RML suggestive of multifocal PNA.  Patient recently hospitalized for PNA in December.  Will start IV vanc, cefepime, and fluids for HCAP.  IV team has been consulted for PICC line placement.  CBC with WBC 2.1 CMP with Cr 1.02 LDH 287  Plan to admit to medicine for HCAP and IV abx.  IV Team aware of PICC line request.  IR consulted, and will place PICC if IV team unable.  Case has been discussed with and seen by Dr. Anitra Lauth who agrees with the above plan for admission.       Cheri Fowler, PA-C 03/15/15 1151  Gwyneth Sprout, MD 03/15/15 [phone]

## 2015-03-15 NOTE — ED Notes (Signed)
Called IV Number to try and PICC line placed. Went to [facility].

## 2015-03-15 NOTE — ED Notes (Signed)
Paged IV Team about PICC Placement
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
## 2015-03-15 NOTE — Progress Notes (Signed)
Pt has temp of 102.1 and was given PO tylenol. Rechecked temp and was 99.0. Delford Field, MD notified. Will continue to monitor pt.

## 2015-03-15 NOTE — ED Notes (Signed)
Paged IV Team and spoke with MD that they stated, "There are eight on the board and I have no way of getting a hold of them. I will leave a message in the office. Radiology does place PICC lines." MD verbalized understanding and stated that she would speak with PA about calling Radiology. MD is aware that patient does not have IV access.

## 2015-03-15 NOTE — ED Notes (Signed)
Admitting team at bedside.

## 2015-03-15 NOTE — ED Notes (Signed)
[place] and IV equipment set up at the bedside for [place], [place]

## 2015-03-15 NOTE — ED Notes (Signed)
IV Team at the bedside. Spoke with PICC team. Made aware of the patient's situation. Stated they had many on the board and IV Team would try to access. If unable, PICC would make pt a higher priority.

## 2015-03-15 NOTE — ED Notes (Signed)
Paged IV Team about PICC placement. No response.

## 2015-03-15 NOTE — Progress Notes (Addendum)
Pt refused blood cultures again. Delford Field; notified. Orders to just write a note at this time.

## 2015-03-15 NOTE — Progress Notes (Signed)
Lab in to get patient's blood cultures. Pt refused to let them draw her labs from her foot but allowed them to stick her LAC. Pt jumped as soon as lab stuck her so her vein was missed and refused to let them try again. Provider was notified

## 2015-03-16 DIAGNOSIS — B1920 Unspecified viral hepatitis C without hepatic coma: Secondary | ICD-10-CM | POA: Diagnosis present

## 2015-03-16 DIAGNOSIS — E46 Unspecified protein-calorie malnutrition: Secondary | ICD-10-CM

## 2015-03-16 LAB — COMPREHENSIVE METABOLIC PANEL WITH GFR
ALT: 15 U/L (ref 14–54)
AST: 37 U/L (ref 15–41)
Albumin: 2.5 g/dL — ABNORMAL LOW (ref 3.5–5.0)
Alkaline Phosphatase: 35 U/L — ABNORMAL LOW (ref 38–126)
Anion gap: 9 (ref 5–15)
BUN: 8 mg/dL (ref 6–20)
CO2: 26 mmol/L (ref 22–32)
Calcium: 7.7 mg/dL — ABNORMAL LOW (ref 8.9–10.3)
Chloride: 103 mmol/L (ref 101–111)
Creatinine, Ser: 0.85 mg/dL (ref 0.44–1.00)
GFR calc Af Amer: >60 mL/min
GFR calc non Af Amer: >60 mL/min
Glucose, Bld: 105 mg/dL — ABNORMAL HIGH (ref 65–99)
Potassium: 3.1 mmol/L — ABNORMAL LOW (ref 3.5–5.1)
Sodium: 138 mmol/L (ref 135–145)
Total Bilirubin: 0.6 mg/dL (ref 0.3–1.2)
Total Protein: 6.1 g/dL — ABNORMAL LOW (ref 6.5–8.1)

## 2015-03-16 LAB — CBC
HEMATOCRIT: 27.1 % — AB (ref 36.0–46.0)
HEMOGLOBIN: 8.5 g/dL — AB (ref 12.0–15.0)
MCH: 24.6 pg — ABNORMAL LOW (ref 26.0–34.0)
MCHC: 31.4 g/dL (ref 30.0–36.0)
MCV: 78.3 fL (ref 78.0–100.0)
Platelets: 85 10*3/uL — ABNORMAL LOW (ref 150–400)
RBC: 3.46 MIL/uL — AB (ref 3.87–5.11)
RDW: 22.3 % — AB (ref 11.5–15.5)
WBC: 1.5 10*3/uL — AB (ref 4.0–10.5)

## 2015-03-16 MED ORDER — POTASSIUM CHLORIDE CRYS ER 20 MEQ PO TBCR
40.0000 meq | EXTENDED_RELEASE_TABLET | Freq: Two times a day (BID) | ORAL | Status: AC
  Administered 2015-03-16 (×2): 40 meq via ORAL
  Filled 2015-03-16 (×2): qty 2

## 2015-03-16 MED ORDER — SODIUM CHLORIDE 0.9 % IV BOLUS (SEPSIS)
1000.0000 mL | Freq: Once | INTRAVENOUS | Status: AC
  Administered 2015-03-16: 1000 mL via INTRAVENOUS

## 2015-03-16 MED ORDER — PRIMAQUINE PHOSPHATE 26.3 MG PO TABS
30.0000 mg | ORAL_TABLET | Freq: Every day | ORAL | Status: DC
  Administered 2015-03-16 – 2015-03-18 (×3): 30 mg via ORAL
  Filled 2015-03-16 (×4): qty 2

## 2015-03-16 MED ORDER — PREDNISONE 20 MG PO TABS
40.0000 mg | ORAL_TABLET | Freq: Two times a day (BID) | ORAL | Status: DC
Start: 2015-03-16 — End: 2015-03-18
  Administered 2015-03-16 – 2015-03-18 (×5): 40 mg via ORAL
  Filled 2015-03-16 (×5): qty 2

## 2015-03-16 MED ORDER — CLINDAMYCIN PHOSPHATE 900 MG/50ML IV SOLN
900.0000 mg | Freq: Three times a day (TID) | INTRAVENOUS | Status: DC
  Administered 2015-03-16 – 2015-03-18 (×8): 900 mg via INTRAVENOUS
  Filled 2015-03-16 (×11): qty 50

## 2015-03-16 NOTE — Progress Notes (Signed)
Initial Nutrition Assessment  DOCUMENTATION CODES:  Non-severe (moderate) malnutrition in context of chronic illness   Pt meets criteria for AT LEAST MODERATE MALNUTRITION in the context of Chronic illness as evidenced by mild or worse fat/muscle wasting.  INTERVENTION:  Ensure Enlive po BID, each supplement provides 350 kcal and 20 grams of protein  MVI with minerals  NUTRITION DIAGNOSIS:  Increased nutrient needs related to chronic illness as evidenced by estimated nutritional requirements for the condition.  GOAL:  Patient will meet greater than or equal to 90% of their needs  MONITOR:  PO intake, Weight trends, Labs  REASON FOR ASSESSMENT:  Consult Wt loss  ASSESSMENT:  25 y/o female PMHx uncontrolled HIV/AIDS hep c,  PTSD, depression, IVDA presents with dyspnea. Also endorses fever, decreased appetite, loss of 10 lbs over 2 weeks, nausea, clear-vomiting, loose stools and abdominal pain. CXR findings consistent with PNA.    On RD arrival, another individual present in room standing at foot of bed. Unsure of his relation to patient. Tried to talk about her nutritional status/ wt loss, but pt was not interested in speaking with patient. She was extremely brief with RD and appeared very anxious/nervous. She declined any and all interventions stating that "everything is fine". Other individual in room just stared down RD during this conversation. One only able to obtain very little information:  Pt stated her usual bw is 125 lbs and that she has lost all of this weight in the last 2 weeks. There is no documentation to confirm this. She states she lost the weight do to poor appetite, but "it is returning".   Even though decline interventions, continue ensure; it sounded as if pt was saying anything just to get RD to exit.   NFPE: Visually, there is very evident muscle/fat wasting of atleast mild degree, but did not attempt due to pt's very evident uneasiness.   Labs reviewed:  Hypokalemia, Hypocalcemia, Anemia, hypoalbuminemia  Diet Order:  Diet regular Room service appropriate?: Yes; Fluid consistency:: Thin  Skin: Dryness  Last BM:  2/11  Height:  Ht Readings from Last 1 Encounters:  03/15/15  (1.575 m)   Weight:  Wt Readings from Last 1 Encounters:  03/15/15 111 lb 4.8 oz (50.485 kg)   Ideal Body Weight:  50 kg  BMI:  Body mass index is 20.35 kg/(m^2).  Estimated Nutritional Needs:  Kcal:  1650-1850 (33-37 kcal/kg bw) Protein:  75-85 kcal/kg bw Fluid:  1.7-1.9 liters fluid  EDUCATION NEEDS:  No education needs identified at this time  Christophe Louis RD, LDN Nutrition Pager: [phone] 03/16/2015 12:05 PM

## 2015-03-16 NOTE — Progress Notes (Signed)
Subjective:  Frances Ball had a fever (objective and subjective) of 102.6 F last night and defervesced to 21 F after paracetamol. She reported that her shortness of breath has improved overall. After discontinuing vancomycin and diphenhydramine, her erythema and flushing improved.  Objective: Vital signs in last 24 hours: Filed Vitals:   03/15/15 2004 03/15/15 2201 03/15/15 2340 03/16/15 0540  BP:  108/69  94/61  Pulse: 129 129  102  Temp:  102.6 F (39.2 C) 99 F (37.2 C) 99 F (37.2 C)  TempSrc:  Oral Oral Oral  Resp:  22  22  Height:      Weight:      SpO2: 95% 93%  95%   Weight change:   Intake/Output Summary (Last 24 hours) at 03/16/15 0853 Last data filed at 03/16/15 0659  Gross per 24 hour  Intake 1786.67 ml  Output    300 ml  Net 1486.67 ml   Physical Exam General: Chronically ill appearing, no acute distress.  HEENT: Temporal wasting,  No scleral icterus Cardiovascular: RRR, no m/r/g Pulmonary: Decreased breath sounds, short duration high pitches wheeze in bases Back: Scratch marks, scarring with post-herpetic neuralgia Abdominal: Soft, NT/ND. Normal bowel sounds Skin: Non-purulent, non-tender rashes on arms, areas of excoriation track marks noted Extremities: No clubbing, cyanosis, or edema Psychiatric: Behavior and affect appropriate  Lab Results: Basic Metabolic Panel:  Recent Labs Lab 03/15/15 0945 03/16/15 0452  NA 137 138  K 3.9 3.1*  CL 97* 103  CO2 25 26  GLUCOSE 80 105*  BUN 11 8  CREATININE 1.02* 0.85  CALCIUM 8.7* 7.7*   Liver Function Tests:  Recent Labs Lab 03/15/15 0945 03/16/15 0452  AST 52* 37  ALT 17 15  ALKPHOS 45 35*  BILITOT 1.0 0.6  PROT 7.1 6.1*  ALBUMIN 3.0* 2.5*   CBC:  Recent Labs Lab 03/15/15 0945 03/16/15 0452  WBC 2.1* 1.5*  NEUTROABS 1.2*  --   HGB 10.2* 8.5*  HCT 31.4* 27.1*  MCV 77.9* 78.3  PLT 84* 85*   Micro Results: Recent Results (from the past 240 hour(s))  MRSA PCR Screening      Status: None   Collection Time: 03/15/15  4:36 PM  Result Value Ref Range Status   MRSA by PCR NEGATIVE NEGATIVE Final    Comment:        The GeneXpert MRSA Assay (FDA approved for NASAL specimens only), is one component of a comprehensive MRSA colonization surveillance program. It is not intended to diagnose MRSA infection nor to guide or monitor treatment for MRSA infections.    Studies/Results: Dg Chest 2 View  03/15/2015  CLINICAL DATA:  Cough and pneumonia for 2 weeks. EXAM: CHEST  2 VIEW COMPARISON:  03/14/2015 FINDINGS: Cardiomediastinal silhouette is normal. Mediastinal contours appear intact. There is no evidence of pleural effusion or pneumothorax. There is persistent patchy left lower lobe airspace consolidation. Smaller isolated areas of consolidation are also seen in the right mid lung. Osseous structures are without acute abnormality. Soft tissues are grossly normal. IMPRESSION: Persist the left lower lobe airspace consolidation. Smaller isolated areas of airspace consolidation are also seen in the right mid lung, suggestive of multifocal pneumonia. Electronically Signed   By: Ted Mcalpine M.D.   On: 03/15/2015 09:29   Medications: I have reviewed the patient's current medications. Scheduled Meds: . ceFEPime (MAXIPIME) IV  1 g Intravenous 3 times per day  . clindamycin (CLEOCIN) IV  900 mg Intravenous 3 times per day  .  darunavir-cobicistat  1 tablet Oral Q breakfast  . dolutegravir  50 mg Oral BID  . feeding supplement (ENSURE ENLIVE)  237 mL Oral BID BM  . lamiVUDine-zidovudine  1 tablet Oral BID  . potassium chloride  40 mEq Oral BID  . predniSONE  40 mg Oral BID WC  . pregabalin  75 mg Oral BID  . primaquine  30 mg Oral Daily   Continuous Infusions: . 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 We will treat empirically, avoiding TMP-SMX due to anaphylaxis and dapsone given treatment failure. If DFA smear is positive, we will discontinue cefepime. Her MRSA swab was negative, and there is need for MRSA coverage. - Clindamycin 900 mg IV q8h + Primaquine 30 mg daily for PJP coverage - Prednisone 40 mg BID (Day 1) - need for 40 BID for 5 days, 40 mg daily for 5 days, then 20 mg daily for 11 days.  - Cefepime IV per pharm for possible bacterial pneumonia coverage - Albuterol nebulizer q2h prn - Ambulate and check pulse oximetry - DFA smear pending   HIV/AIDS: 12/28 CD4 78 VL >100,000. She reports consistent adherence with her current regimen of Prezcobix, Combivir, and Tivicay. Immunocompromise likely explains rashes and ongoing loose stool.  - Repeat CD4, VL (w/ reflex genotype) to monitor response to current regimen, need for additional prophylaxis, and resistance profile - Intregrase HIV pending - Continue Combivir, Prezcobix, Tivicay  AKI: Cr 1.02 in setting of poor po intake on admission, now 0.85.  - NS 100 cc/hr  Hepatitis C: Untreated until HIV viremia under control. No cirrhosis or hepatic coma. AST 52, ALT normal.   Post-herpetic Neuralgia: Exquisitely tender on exam - Pregabalin  Protein calorie malnutrition: Albumin 2.5   Dispo: Disposition is deferred at this time, awaiting improvement of current medical problems.  Anticipated discharge in approximately 2-3 days.  The patient does have a current PCP ([place], PA-C) and does not need an [facility] hospital follow-up appointment after discharge.  The patient does have transportation limitations that hinder  transportation to clinic appointments.  .Services Needed at time of discharge: Y = Yes, Blank = No PT:   OT:   RN:   Equipment:   Other:     LOS: 1 day   Ruben Im, MD 03/16/2015, 8:53 AM

## 2015-03-16 NOTE — Progress Notes (Signed)
Patient refused to allow lab to draw the second blood culture.

## 2015-03-16 NOTE — Progress Notes (Signed)
SATURATION QUALIFICATIONS: (This note is used to comply with regulatory documentation for home oxygen)  Patient Saturations on Room Air at Rest = 94%  Patient Saturations on Room Air while Ambulating =ranged from 88-93%   Please briefly explain why patient needs home oxygen:

## 2015-03-17 DIAGNOSIS — E440 Moderate protein-calorie malnutrition: Secondary | ICD-10-CM | POA: Insufficient documentation

## 2015-03-17 DIAGNOSIS — J189 Pneumonia, unspecified organism: Secondary | ICD-10-CM | POA: Insufficient documentation

## 2015-03-17 DIAGNOSIS — D696 Thrombocytopenia, unspecified: Secondary | ICD-10-CM

## 2015-03-17 LAB — BASIC METABOLIC PANEL
ANION GAP: 7 (ref 5–15)
BUN: 8 mg/dL (ref 6–20)
CALCIUM: 8 mg/dL — AB (ref 8.9–10.3)
CO2: 23 mmol/L (ref 22–32)
Chloride: 110 mmol/L (ref 101–111)
Creatinine, Ser: 0.7 mg/dL (ref 0.44–1.00)
GFR calc Af Amer: >60 mL/min (ref >60)
GLUCOSE: 150 mg/dL — AB (ref 65–99)
POTASSIUM: 3.7 mmol/L (ref 3.5–5.1)
SODIUM: 140 mmol/L (ref 135–145)

## 2015-03-17 LAB — CBC
HCT: 27.2 % — ABNORMAL LOW (ref 36.0–46.0)
Hemoglobin: 8.5 g/dL — ABNORMAL LOW (ref 12.0–15.0)
MCH: 24.5 pg — AB (ref 26.0–34.0)
MCHC: 31.3 g/dL (ref 30.0–36.0)
MCV: 78.4 fL (ref 78.0–100.0)
PLATELETS: 120 10*3/uL — AB (ref 150–400)
RBC: 3.47 MIL/uL — AB (ref 3.87–5.11)
RDW: 22.8 % — AB (ref 11.5–15.5)
WBC: 1.7 10*3/uL — AB (ref 4.0–10.5)

## 2015-03-17 MED ORDER — FAMOTIDINE 20 MG PO TABS
20.0000 mg | ORAL_TABLET | Freq: Every evening | ORAL | Status: DC | PRN
  Administered 2015-03-18: 20 mg via ORAL
  Filled 2015-03-17: qty 1

## 2015-03-17 NOTE — Progress Notes (Signed)
SATURATION QUALIFICATIONS: (This note is used to comply with regulatory documentation for home oxygen)  Patient Saturations on Room Air at Rest = 91%  Patient Saturations on Room Air while Ambulating = 85-96%  Patient Saturations on 96 Liters of oxygen while Ambulating = 2L%  Please briefly explain why patient needs home oxygen:

## 2015-03-17 NOTE — Progress Notes (Signed)
Pt requesting something for heartburn. Provider Delford Field, notified. Order to be placed.

## 2015-03-17 NOTE — Progress Notes (Signed)
Subjective:  Patient was seen and examined this morning. She feels that her shortness of breath and cough are improving. She continues to feel short of breath with ambulation. She denies any fever or chills.   Objective: Vital signs in last 24 hours: Filed Vitals:   03/16/15 1320 03/16/15 1501 03/16/15 2136 03/17/15 0538  BP: 99/63  91/67 94/64  Pulse: 99 103 90 79  Temp: 98.4 F (36.9 C)  97.7 F (36.5 C) 97.8 F (36.6 C)  TempSrc: Oral  Oral Oral  Resp: Height:      Weight:      SpO2: 96% 94% 97% 93%   Weight change:   Intake/Output Summary (Last 24 hours) at 03/17/15 1144 Last data filed at 03/16/15 1322  Gross per 24 hour  Intake      0 ml  Output    300 ml  Net   -300 ml   General: Vital signs reviewed.  Patient is thin, chronically ill appearing, in no acute distress and cooperative with exam.  Cardiovascular: RRR, S1 normal, S2 normal, no murmurs, gallops, or rubs. Pulmonary/Chest: Inspiratory crackles in lower lung fields, no wheezes, or rhonchi. Abdominal: Soft, non-tender, non-distended, BS + Extremities: No lower extremity edema bilaterally, pulses symmetric and intact bilaterally. Skin: Multiple scars and lesions with excoriations. Old track mark scars.   Lab Results: Basic Metabolic Panel:  Recent Labs Lab 03/16/15 0452 03/17/15 0509  NA 138 140  K 3.1* 3.7  CL 103 110  CO2 26 23  GLUCOSE 105* 150*  BUN 8 8  CREATININE 0.85 0.70  CALCIUM 7.7* 8.0*   Liver Function Tests:  Recent Labs Lab 03/15/15 0945 03/16/15 0452  AST 52* 37  ALT 17 15  ALKPHOS 45 35*  BILITOT 1.0 0.6  PROT 7.1 6.1*  ALBUMIN 3.0* 2.5*   CBC:  Recent Labs Lab 03/15/15 0945 03/16/15 0452 03/17/15 0509  WBC 2.1* 1.5* 1.7*  NEUTROABS 1.2*  --   --   HGB 10.2* 8.5* 8.5*  HCT 31.4* 27.1* 27.2*  MCV 77.9* 78.3 78.4  PLT 84* 85* 120*   Micro Results: Recent Results (from the past 240 hour(s))  MRSA PCR Screening     Status: None   Collection Time:  03/15/15  4:36 PM  Result Value Ref Range Status   MRSA by PCR NEGATIVE NEGATIVE Final    Comment:        The GeneXpert MRSA Assay (FDA approved for NASAL specimens only), is one component of a comprehensive MRSA colonization surveillance program. It is not intended to diagnose MRSA infection nor to guide or monitor treatment for MRSA infections.   Culture, blood (Routine X 2) w Reflex to ID Panel     Status: None (Preliminary result)   Collection Time: 03/16/15  8:50 AM  Result Value Ref Range Status   Specimen Description BLOOD PICC LINE  Final   Special Requests IN PEDIATRIC BOTTLE 2CC  Final   Culture NO GROWTH < 24 HOURS  Final   Report Status PENDING  Incomplete   Studies/Results: No results found. Medications:  I have reviewed the patient's current medications. Prior to Admission:  Prescriptions prior to admission  Medication Sig Dispense Refill Last Dose  . COMBIVIR 150-300 MG tablet Take 1 tablet by mouth 2 (two) times daily.  0 03/14/2015 at Unknown time  . dapsone 100 MG tablet Take 100 mg by mouth.   03/14/2015 at Unknown time  . PREZCOBIX 800-150 MG tablet  Take 1 tablet by mouth daily.  2 03/14/2015 at Unknown time  . TIVICAY 50 MG tablet Take 1 tablet by mouth 2 (two) times daily.  2 03/14/2015 at Unknown time   Scheduled Meds: . ceFEPime (MAXIPIME) IV  1 g Intravenous 3 times per day  . clindamycin (CLEOCIN) IV  900 mg Intravenous 3 times per day  . darunavir-cobicistat  1 tablet Oral Q breakfast  . dolutegravir  50 mg Oral BID  . feeding supplement (ENSURE ENLIVE)  237 mL Oral BID BM  . lamiVUDine-zidovudine  1 tablet Oral BID  . predniSONE  40 mg Oral BID WC  . pregabalin  75 mg Oral BID  . primaquine  30 mg Oral Daily   Continuous Infusions:  PRN Meds:.acetaminophen **OR** acetaminophen, albuterol, ondansetron **OR** ondansetron (ZOFRAN) IV, sodium chloride flush Assessment/Plan: Principal Problem:   PCP (pneumocystis jiroveci pneumonia) (HCC) Active  Problems:   AIDS (acquired immune deficiency syndrome) (HCC)   Post herpetic neuralgia   Malnutrition (HCC)   Hepatitis C virus   Malnutrition of moderate degree  PJP Pneumonia: Given patient's AIDS status, elevated LDH, non-productive cough, and persistent symptoms despite adequate treatment for CAP in January, likely diagnosis is PJP Pneumonia. However, HCAP is still a consideration given her crackles on examination, previous prophylaxis for PJP with dapsone, and lack of impressive desaturation with ambulation. Patient has been afebrile for 24 hours. Yesterday, patient was ambulated with pulse ox which revealed O2 sats at 94% at rest and mild desaturations to 88-93% on room air with ambulation.  -Continue Cefepime for HCAP coverage (MRSA negative) -Continue Clindamycin 900 mg IV Q8H for PJP -Continue Prednisone 40 mg BID for 5 days, then de-escalate to 40 mg QD for 5 days, then 20 mg QD for 11 days for PJP -Continue Primaquine 30 mg daily for PJP (avoiding Bactrim d/t allergy) -Repeat ambulatory sats -PJP Smear in process- if negative, can treat solely for HCAP -Blood Cx 2/11>>  AIDS: Diagnosed in 2009, last CD4 count 78 with HIV RNA viral load >100,000 in December 2016. Patient reports compliance with new regimen of Prezcobix, Combivir, Tivicay. We will continue current regimen and recheck CD4, viral load and integrase. -Continue Prezcobix -Continue Combivir -Continue Tivicay -HIV RNA pending -CD4 count pending -GenoSure Integrase HIV pending  Hepatitis C: Active Hepatitis C. Patient will not receive treatment until HIV is under better control.  Thrombocytopenia: Secondary to HIV and Hepatitis C. Platelets 120 this morning without signs of bleeding. -Avoiding anticoagulation  Post-Herpetic Neuralgia: Patient continues to have sharp nerve pain on upper left back d/t sequela from Shingles. -Continue Lyrica 75 mg BID  DVT/PE ppx: SCDs given thrombocytopenia <100  Dispo: Disposition  is deferred at this time, awaiting improvement of current medical problems.  Anticipated discharge in approximately 2-3 day(s).   The patient does have a current PCP ([place], PA-C) and does not need an [facility] hospital follow-up appointment after discharge.  The patient does not have transportation limitations that hinder transportation to clinic appointments.   LOS: 2 days   Jill Alexanders, DO PGY-2 Internal Medicine Resident Pager # [phone] 03/17/2015 11:44 AM

## 2015-03-18 DIAGNOSIS — J189 Pneumonia, unspecified organism: Secondary | ICD-10-CM

## 2015-03-18 DIAGNOSIS — Y95 Nosocomial condition: Secondary | ICD-10-CM

## 2015-03-18 LAB — HIV-1 RNA ULTRAQUANT REFLEX TO GENTYP+
HIV-1 RNA BY PCR: 180 copies/mL
HIV-1 RNA Quant, Log: 2.255 log10copy/mL

## 2015-03-18 LAB — PNEUMOCYSTIS JIROVECI SMEAR BY DFA: Pneumocystis jiroveci Ag: NEGATIVE

## 2015-03-18 MED ORDER — CEFUROXIME AXETIL 500 MG PO TABS: 500.0000 mg | ORAL_TABLET | Freq: Two times a day (BID) | ORAL | Status: DC

## 2015-03-18 MED ORDER — CEFUROXIME AXETIL 500 MG PO TABS
500.0000 mg | ORAL_TABLET | Freq: Two times a day (BID) | ORAL | Status: DC
  Filled 2015-03-18: qty 1

## 2015-03-18 MED ORDER — PREGABALIN 75 MG PO CAPS: 75.0000 mg | ORAL_CAPSULE | Freq: Two times a day (BID) | ORAL | Status: DC

## 2015-03-18 MED ORDER — ALBUTEROL SULFATE (2.5 MG/3ML) 0.083% IN NEBU: 2.5000 mg | INHALATION_SOLUTION | RESPIRATORY_TRACT | Status: DC | PRN

## 2015-03-18 NOTE — Consult Note (Signed)
Pharmacy Antibiotic Note  Frances Ball is a 25 y.o. female admitted on 03/15/2015 with multifocal pneumonia.  Pharmacy has been consulted for cefepime dosing - day #4. CrCl~85. D#4. Also on clinda + primaquine for PJP coverage with Bactrim allergy. WBC 1.7 stable, afeb.  Plan: -Cefepime 1g IV q8h -clindamycin  IV q8h -primaquine  po daily -f/u c/s, renal function, LOT -Repeat CD4, VL ip  Height:  (157.5 cm) Weight: 111 lb 4.8 oz (50.485 kg) IBW/kg (Calculated) : 50.1  Temp (24hrs), Avg:97.9 F (36.6 C), Min:97.6 F (36.4 C), Max:98.2 F (36.8 C)   Allergies  Allergen Reactions  . Levaquin [Levofloxacin In D5w] Shortness Of Breath and Rash  . Zithromax [Azithromycin] Shortness Of Breath, Swelling and Other (See Comments)    Pt reports tightness in skin with redness   . Bactrim [Sulfamethoxazole-Trimethoprim] Swelling    Antimicrobials this admission: Vanc 2/10>>2/11 (had flushing reaction) Cefepime 2/10>> Clinda 2/11>> Primaquine 2/11>>  Dose adjustments this admission: N/A  Microbiology results: 2/11 BCx: ngtd (pt wouldn't allow 2nd cx to be drawn) 2/10 MRSA PCR: neg 2/11 pneumocystis: sent 2/10 flu panel: neg  Babs Bertin, PharmD, BCPS Clinical Pharmacist Pager [phone] 03/18/2015 11:25 AM

## 2015-03-18 NOTE — Progress Notes (Signed)
Pt given discharge instructions, prescriptions, and care notes. Pt verbalized understanding AEB no further questions or concerns at this time. Picc line was discontinued, no redness, pain, or swelling noted at this time. Pt left the floor via wheelchair with staff in stable condition.
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
## 2015-03-18 NOTE — Progress Notes (Signed)
SATURATION QUALIFICATIONS: (This note is used to comply with regulatory documentation for home oxygen)  Patient Saturations on Room Air at Rest = 95%  Patient Saturations on Room Air while Ambulating = 90-94%  Please briefly explain why patient needs home oxygen: Pt does not qualify for home O2

## 2015-03-18 NOTE — Discharge Instructions (Signed)
Frances Ball, it was a pleasure taking care of you in the hospital.  Based on your chest X-ray and lab results, it is far more likely that you have a pneumonia instead of PCP pneumonia. Therefore, we will continue to treat you will antibiotics for pneumonia. You will take Cefuroxime 500 mg twice a day for six more days, your last dose being on February 19th.  Please follow-up with in the Infectious Disease Clinic and [place]. You already have an appointment scheduled, but go ahead an call them to confirm.  For your nerve pain from the shingles on your back, we have given you pregabalin (Lyrica) while in the hospital. If you found this helpful, I have prescribed it to you with a written prescription.
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
## 2015-03-18 NOTE — Progress Notes (Deleted)
SATURATION QUALIFICATIONS: (This note is used to comply with regulatory documentation for home oxygen)  Patient Saturations on Room Air at Rest = 96%  Patient Saturations on Room Air while Ambulating = 94-96%  Patient described mild shortness of breath while ambulating that improved as she continued walking. No need for home oxygen.

## 2015-03-19 LAB — GENOSURE INTEGRASE HIV EDI

## 2015-03-19 LAB — T-HELPER CELLS (CD4) COUNT (NOT AT ARMC)
CD4 % Helper T Cell: 20 % — ABNORMAL LOW (ref 33–55)
CD4 T Cell Abs: 100 /uL — ABNORMAL LOW (ref 400–2700)

## 2015-03-21 LAB — CULTURE, BLOOD (ROUTINE X 2): CULTURE: NO GROWTH

## 2015-04-06 ENCOUNTER — Other Ambulatory Visit: Payer: Self-pay | Admitting: Infectious Disease

## 2015-04-11 ENCOUNTER — Emergency Department (HOSPITAL_COMMUNITY): Payer: Medicaid Other

## 2015-04-11 ENCOUNTER — Inpatient Hospital Stay (HOSPITAL_COMMUNITY)
Admission: EM | Admit: 2015-04-11 | Discharge: 2015-04-13 | DRG: 975 | Disposition: A | Discharge status: Home / Self Care | Payer: Medicaid Other | Attending: Infectious Disease | Admitting: Infectious Disease

## 2015-04-11 ENCOUNTER — Encounter (HOSPITAL_COMMUNITY): Payer: Self-pay | Admitting: Cardiology

## 2015-04-11 DIAGNOSIS — F329 Major depressive disorder, single episode, unspecified: Secondary | ICD-10-CM | POA: Diagnosis present

## 2015-04-11 DIAGNOSIS — J189 Pneumonia, unspecified organism: Secondary | ICD-10-CM | POA: Diagnosis present

## 2015-04-11 DIAGNOSIS — B1920 Unspecified viral hepatitis C without hepatic coma: Secondary | ICD-10-CM | POA: Diagnosis present

## 2015-04-11 DIAGNOSIS — Z881 Allergy status to other antibiotic agents status: Secondary | ICD-10-CM | POA: Diagnosis not present

## 2015-04-11 DIAGNOSIS — A599 Trichomoniasis, unspecified: Secondary | ICD-10-CM | POA: Diagnosis present

## 2015-04-11 DIAGNOSIS — B0229 Other postherpetic nervous system involvement: Secondary | ICD-10-CM | POA: Diagnosis present

## 2015-04-11 DIAGNOSIS — A047 Enterocolitis due to Clostridium difficile: Secondary | ICD-10-CM | POA: Diagnosis present

## 2015-04-11 DIAGNOSIS — N179 Acute kidney failure, unspecified: Secondary | ICD-10-CM | POA: Diagnosis present

## 2015-04-11 DIAGNOSIS — J1008 Influenza due to other identified influenza virus with other specified pneumonia: Secondary | ICD-10-CM | POA: Diagnosis present

## 2015-04-11 DIAGNOSIS — A419 Sepsis, unspecified organism: Secondary | ICD-10-CM | POA: Diagnosis present

## 2015-04-11 DIAGNOSIS — Z21 Asymptomatic human immunodeficiency virus [HIV] infection status: Secondary | ICD-10-CM | POA: Diagnosis present

## 2015-04-11 DIAGNOSIS — R0600 Dyspnea, unspecified: Secondary | ICD-10-CM | POA: Diagnosis not present

## 2015-04-11 DIAGNOSIS — J102 Influenza due to other identified influenza virus with gastrointestinal manifestations: Secondary | ICD-10-CM | POA: Diagnosis present

## 2015-04-11 DIAGNOSIS — F4310 Post-traumatic stress disorder, unspecified: Secondary | ICD-10-CM | POA: Diagnosis present

## 2015-04-11 DIAGNOSIS — Y95 Nosocomial condition: Secondary | ICD-10-CM | POA: Diagnosis present

## 2015-04-11 DIAGNOSIS — R509 Fever, unspecified: Secondary | ICD-10-CM | POA: Diagnosis not present

## 2015-04-11 DIAGNOSIS — E860 Dehydration: Secondary | ICD-10-CM | POA: Diagnosis present

## 2015-04-11 DIAGNOSIS — E876 Hypokalemia: Secondary | ICD-10-CM | POA: Diagnosis present

## 2015-04-11 DIAGNOSIS — B20 Human immunodeficiency virus [HIV] disease: Secondary | ICD-10-CM | POA: Diagnosis present

## 2015-04-11 DIAGNOSIS — Z9119 Patient's noncompliance with other medical treatment and regimen: Secondary | ICD-10-CM | POA: Diagnosis not present

## 2015-04-11 DIAGNOSIS — A5901 Trichomonal vulvovaginitis: Secondary | ICD-10-CM | POA: Diagnosis present

## 2015-04-11 DIAGNOSIS — J101 Influenza due to other identified influenza virus with other respiratory manifestations: Secondary | ICD-10-CM | POA: Diagnosis present

## 2015-04-11 DIAGNOSIS — K529 Noninfective gastroenteritis and colitis, unspecified: Secondary | ICD-10-CM | POA: Diagnosis present

## 2015-04-11 DIAGNOSIS — A0472 Enterocolitis due to Clostridium difficile, not specified as recurrent: Secondary | ICD-10-CM | POA: Diagnosis present

## 2015-04-11 LAB — CBC WITH DIFFERENTIAL/PLATELET
BASOS ABS: 0 10*3/uL (ref 0.0–0.1)
BASOS PCT: 0 %
EOS ABS: 0 10*3/uL (ref 0.0–0.7)
Eosinophils Relative: 0 %
HCT: 43 % (ref 36.0–46.0)
Hemoglobin: 14.2 g/dL (ref 12.0–15.0)
LYMPHS PCT: 7 %
Lymphs Abs: 0.9 10*3/uL (ref 0.7–4.0)
MCH: 26.7 pg (ref 26.0–34.0)
MCHC: 33 g/dL (ref 30.0–36.0)
MCV: 81 fL (ref 78.0–100.0)
MONO ABS: 0.4 10*3/uL (ref 0.1–1.0)
Monocytes Relative: 3 %
NEUTROS PCT: 90 %
Neutro Abs: 11.6 10*3/uL — ABNORMAL HIGH (ref 1.7–7.7)
PLATELETS: 237 10*3/uL (ref 150–400)
RBC: 5.31 MIL/uL — AB (ref 3.87–5.11)
RDW: 24.2 % — ABNORMAL HIGH (ref 11.5–15.5)
WBC: 12.9 10*3/uL — AB (ref 4.0–10.5)

## 2015-04-11 LAB — URINE MICROSCOPIC-ADD ON: Bacteria, UA: NONE SEEN

## 2015-04-11 LAB — COMPREHENSIVE METABOLIC PANEL
ALT: 18 U/L (ref 14–54)
ANION GAP: 21 — AB (ref 5–15)
AST: 30 U/L (ref 15–41)
Albumin: 4.5 g/dL (ref 3.5–5.0)
Alkaline Phosphatase: 69 U/L (ref 38–126)
BILIRUBIN TOTAL: 1 mg/dL (ref 0.3–1.2)
BUN: 30 mg/dL — ABNORMAL HIGH (ref 6–20)
CO2: 21 mmol/L — AB (ref 22–32)
Calcium: 10.2 mg/dL (ref 8.9–10.3)
Chloride: 97 mmol/L — ABNORMAL LOW (ref 101–111)
Creatinine, Ser: 1.79 mg/dL — ABNORMAL HIGH (ref 0.44–1.00)
GFR, EST AFRICAN AMERICAN: 44 mL/min — AB (ref >60)
GFR, EST NON AFRICAN AMERICAN: 38 mL/min — AB (ref >60)
GLUCOSE: 127 mg/dL — AB (ref 65–99)
POTASSIUM: 3.6 mmol/L (ref 3.5–5.1)
Sodium: 139 mmol/L (ref 135–145)
TOTAL PROTEIN: 9.6 g/dL — AB (ref 6.5–8.1)

## 2015-04-11 LAB — I-STAT BETA HCG BLOOD, ED (MC, WL, AP ONLY)

## 2015-04-11 LAB — TROPONIN I: TROPONIN I: 0.04 ng/mL — AB (ref <0.031)

## 2015-04-11 LAB — INFLUENZA PANEL BY PCR (TYPE A & B)
H1N1 flu by pcr: DETECTED — AB
INFLBPCR: NEGATIVE
Influenza A By PCR: POSITIVE — AB

## 2015-04-11 LAB — I-STAT CG4 LACTIC ACID, ED: LACTIC ACID, VENOUS: 2.97 mmol/L — AB (ref 0.5–2.0)

## 2015-04-11 LAB — URINALYSIS, ROUTINE W REFLEX MICROSCOPIC
Glucose, UA: NEGATIVE mg/dL
KETONES UR: 15 mg/dL — AB
NITRITE: NEGATIVE
PROTEIN: 100 mg/dL — AB
Specific Gravity, Urine: 1.03 (ref 1.005–1.030)
pH: 5 (ref 5.0–8.0)

## 2015-04-11 LAB — LACTATE DEHYDROGENASE: LDH: 203 U/L — AB (ref 98–192)

## 2015-04-11 LAB — LIPASE, BLOOD: Lipase: 25 U/L (ref 11–51)

## 2015-04-11 MED ORDER — PRIMAQUINE PHOSPHATE 26.3 MG PO TABS: 30.0000 mg | ORAL_TABLET | Freq: Every day | ORAL | Status: DC

## 2015-04-11 MED ORDER — ALBUTEROL SULFATE (2.5 MG/3ML) 0.083% IN NEBU: 2.5000 mg | INHALATION_SOLUTION | RESPIRATORY_TRACT | Status: DC | PRN

## 2015-04-11 MED ORDER — OSELTAMIVIR PHOSPHATE 75 MG PO CAPS
75.0000 mg | ORAL_CAPSULE | Freq: Two times a day (BID) | ORAL | Status: DC
  Administered 2015-04-12 – 2015-04-13 (×4): 75 mg via ORAL
  Filled 2015-04-11 (×6): qty 1

## 2015-04-11 MED ORDER — SODIUM CHLORIDE 0.9 % IV BOLUS (SEPSIS)
500.0000 mL | INTRAVENOUS | Status: AC
  Administered 2015-04-11: 500 mL via INTRAVENOUS

## 2015-04-11 MED ORDER — PROMETHAZINE HCL 25 MG/ML IJ SOLN
12.5000 mg | Freq: Once | INTRAMUSCULAR | Status: AC
  Administered 2015-04-11: 12.5 mg via INTRAVENOUS

## 2015-04-11 MED ORDER — SODIUM CHLORIDE 0.9 % IV BOLUS (SEPSIS)
1000.0000 mL | Freq: Once | INTRAVENOUS | Status: AC
  Administered 2015-04-11: 1000 mL via INTRAVENOUS

## 2015-04-11 MED ORDER — ACETAMINOPHEN 650 MG RE SUPP: 650.0000 mg | Freq: Four times a day (QID) | RECTAL | Status: DC | PRN

## 2015-04-11 MED ORDER — DAPSONE 100 MG PO TABS: 100.0000 mg | ORAL_TABLET | Freq: Every day | ORAL | Status: DC

## 2015-04-11 MED ORDER — LAMIVUDINE-ZIDOVUDINE 150-300 MG PO TABS: 1.0000 | ORAL_TABLET | Freq: Two times a day (BID) | ORAL | Status: DC

## 2015-04-11 MED ORDER — PIPERACILLIN-TAZOBACTAM 3.375 G IVPB 30 MIN
3.3750 g | Freq: Once | INTRAVENOUS | Status: AC
  Administered 2015-04-11: 3.375 g via INTRAVENOUS
  Filled 2015-04-11: qty 50

## 2015-04-11 MED ORDER — CLINDAMYCIN PHOSPHATE 900 MG/50ML IV SOLN: 900.0000 mg | Freq: Three times a day (TID) | INTRAVENOUS | Status: DC

## 2015-04-11 MED ORDER — SODIUM CHLORIDE 0.9 % IV SOLN
INTRAVENOUS | Status: DC
  Administered 2015-04-11 – 2015-04-12 (×3): via INTRAVENOUS

## 2015-04-11 MED ORDER — VANCOMYCIN HCL IN DEXTROSE 1-5 GM/200ML-% IV SOLN
1000.0000 mg | Freq: Once | INTRAVENOUS | Status: DC
  Filled 2015-04-11: qty 200

## 2015-04-11 MED ORDER — ONDANSETRON HCL 4 MG/2ML IJ SOLN
4.0000 mg | Freq: Three times a day (TID) | INTRAMUSCULAR | Status: DC | PRN
  Administered 2015-04-11: 4 mg via INTRAVENOUS
  Filled 2015-04-11: qty 2

## 2015-04-11 MED ORDER — CEFEPIME HCL 1 G IJ SOLR
1.0000 g | INTRAMUSCULAR | Status: DC
  Administered 2015-04-11: 1 g via INTRAVENOUS
  Filled 2015-04-11: qty 1

## 2015-04-11 MED ORDER — ONDANSETRON HCL 4 MG/2ML IJ SOLN
4.0000 mg | Freq: Three times a day (TID) | INTRAMUSCULAR | Status: DC | PRN
  Filled 2015-04-11: qty 2

## 2015-04-11 MED ORDER — PREGABALIN 75 MG PO CAPS
75.0000 mg | ORAL_CAPSULE | Freq: Two times a day (BID) | ORAL | Status: DC
  Administered 2015-04-12 – 2015-04-13 (×4): 75 mg via ORAL
  Filled 2015-04-11 (×4): qty 1

## 2015-04-11 MED ORDER — ONDANSETRON HCL 4 MG/2ML IJ SOLN
4.0000 mg | Freq: Once | INTRAMUSCULAR | Status: AC
  Administered 2015-04-11: 4 mg via INTRAVENOUS
  Filled 2015-04-11: qty 2

## 2015-04-11 MED ORDER — DARUNAVIR-COBICISTAT 800-150 MG PO TABS: 1.0000 | ORAL_TABLET | Freq: Every day | ORAL | Status: DC

## 2015-04-11 MED ORDER — ACETAMINOPHEN 325 MG PO TABS: 650.0000 mg | ORAL_TABLET | Freq: Four times a day (QID) | ORAL | Status: DC | PRN

## 2015-04-11 MED ORDER — HEPARIN SODIUM (PORCINE) 5000 UNIT/ML IJ SOLN
5000.0000 [IU] | Freq: Three times a day (TID) | INTRAMUSCULAR | Status: DC
  Filled 2015-04-11 (×2): qty 1

## 2015-04-11 MED ORDER — DOLUTEGRAVIR SODIUM 50 MG PO TABS: 50.0000 mg | ORAL_TABLET | Freq: Two times a day (BID) | ORAL | Status: DC
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
## 2015-04-11 NOTE — ED Notes (Signed)
Patient states she is nauseated and would like to have something.
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
## 2015-04-11 NOTE — ED Notes (Signed)
Pt reports cough, congestion and fever for the past couple of days. Sick contacts at home. Pt is HIV positive with elevated CD4 count.

## 2015-04-11 NOTE — Progress Notes (Signed)
Pharmacy Antibiotic Note  Frances Ball is a 25 y.o. female admitted on 04/11/2015 with sepsis.  Pharmacy has been consulted for Cefepime dosing. Initially started on vancomycin and zosyn for suspected sepsis. Antibiotics now de-escalated to cefepime alone.   WBC 12.9, afebrile, CrCl ~3838mL/min    Plan: Cefepime 1g IV Q24h  F/U c/s, renal fxn, LOT  Weight: 111 lb (50.349 kg)  Temp (24hrs), Avg:98.7 F (37.1 C), Min:97.5 F (36.4 C), Max:99.9 F (37.7 C)   Recent Labs Lab 04/11/15 1329 04/11/15 1355  WBC 12.9*  --   CREATININE 1.79*  --   LATICACIDVEN  --  2.97*    Estimated Creatinine Clearance: 38 mL/min (by C-G formula based on Cr of 1.79).    Allergies  Allergen Reactions  . Levaquin [Levofloxacin In D5w] Shortness Of Breath and Rash  . Zithromax [Azithromycin] Shortness Of Breath, Swelling and Other (See Comments)    Pt reports tightness in skin with redness   . Bactrim [Sulfamethoxazole-Trimethoprim] Swelling and Other (See Comments)    Pt reports that she was placed on Bactrim for pneumonia when she was [facility] and developed a red face with swelling and can not tolerate Bactrim.   . Vancomycin Other (See Comments)    intolerance    Antimicrobials this admission: 3/9 vanc x1 in the ED 3/9 Zosyn x1 in the ED 3/9 cefepime>>    Thank you for allowing pharmacy to be a part of this patient's care.  Frances Ball, PharmD Pharmacy Resident  Pager: [phone] 04/11/2015 4:00 PM
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
## 2015-04-11 NOTE — ED Notes (Signed)
Pt vomiting.

## 2015-04-11 NOTE — ED Notes (Signed)
Notified Dr. Ranae Palmsyelverton of elevated Lactic acid and recent HR of 141.

## 2015-04-11 NOTE — Progress Notes (Signed)
Patient refused lab draws. Patient stated that she is a hard stick and advised MD to place PICC line. MD notified. RN will continue to monitor patient.  Veatrice KellsMahmoud,Rachal Dvorsky I, RN

## 2015-04-12 ENCOUNTER — Other Ambulatory Visit: Payer: Self-pay | Admitting: Pharmacist

## 2015-04-12 ENCOUNTER — Encounter (HOSPITAL_COMMUNITY): Payer: Self-pay | Admitting: *Deleted

## 2015-04-12 ENCOUNTER — Inpatient Hospital Stay (HOSPITAL_COMMUNITY): Payer: Medicaid Other

## 2015-04-12 ENCOUNTER — Other Ambulatory Visit (HOSPITAL_COMMUNITY): Payer: Medicaid Other

## 2015-04-12 DIAGNOSIS — J101 Influenza due to other identified influenza virus with other respiratory manifestations: Secondary | ICD-10-CM

## 2015-04-12 DIAGNOSIS — A419 Sepsis, unspecified organism: Secondary | ICD-10-CM

## 2015-04-12 DIAGNOSIS — A599 Trichomoniasis, unspecified: Secondary | ICD-10-CM | POA: Diagnosis present

## 2015-04-12 DIAGNOSIS — A0472 Enterocolitis due to Clostridium difficile, not specified as recurrent: Secondary | ICD-10-CM | POA: Diagnosis present

## 2015-04-12 DIAGNOSIS — R0600 Dyspnea, unspecified: Secondary | ICD-10-CM

## 2015-04-12 DIAGNOSIS — J189 Pneumonia, unspecified organism: Secondary | ICD-10-CM

## 2015-04-12 LAB — BASIC METABOLIC PANEL
Anion gap: 12 (ref 5–15)
BUN: 18 mg/dL (ref 6–20)
CO2: 22 mmol/L (ref 22–32)
CREATININE: 1.09 mg/dL — AB (ref 0.44–1.00)
Calcium: 8.1 mg/dL — ABNORMAL LOW (ref 8.9–10.3)
Chloride: 106 mmol/L (ref 101–111)
GFR calc Af Amer: >60 mL/min (ref >60)
GLUCOSE: 91 mg/dL (ref 65–99)
Potassium: 3.2 mmol/L — ABNORMAL LOW (ref 3.5–5.1)
SODIUM: 140 mmol/L (ref 135–145)

## 2015-04-12 LAB — CBC WITH DIFFERENTIAL/PLATELET
BASOS PCT: 0 %
Basophils Absolute: 0 10*3/uL (ref 0.0–0.1)
EOS ABS: 0 10*3/uL (ref 0.0–0.7)
Eosinophils Relative: 0 %
HCT: 30.2 % — ABNORMAL LOW (ref 36.0–46.0)
Hemoglobin: 9.5 g/dL — ABNORMAL LOW (ref 12.0–15.0)
LYMPHS ABS: 0.7 10*3/uL (ref 0.7–4.0)
Lymphocytes Relative: 16 %
MCH: 26 pg (ref 26.0–34.0)
MCHC: 31.5 g/dL (ref 30.0–36.0)
MCV: 82.7 fL (ref 78.0–100.0)
MONO ABS: 0.3 10*3/uL (ref 0.1–1.0)
Monocytes Relative: 6 %
NEUTROS ABS: 3.4 10*3/uL (ref 1.7–7.7)
NEUTROS PCT: 78 %
PLATELETS: 172 10*3/uL (ref 150–400)
RBC: 3.65 MIL/uL — ABNORMAL LOW (ref 3.87–5.11)
RDW: 24.2 % — AB (ref 11.5–15.5)
WBC: 4.4 10*3/uL (ref 4.0–10.5)

## 2015-04-12 LAB — GASTROINTESTINAL PANEL BY PCR, STOOL (REPLACES STOOL CULTURE)
ADENOVIRUS F40/41: NOT DETECTED
ASTROVIRUS: NOT DETECTED
CAMPYLOBACTER SPECIES: NOT DETECTED
Cryptosporidium: NOT DETECTED
Cyclospora cayetanensis: NOT DETECTED
E. coli O157: NOT DETECTED
ENTEROAGGREGATIVE E COLI (EAEC): NOT DETECTED
ENTEROPATHOGENIC E COLI (EPEC): NOT DETECTED
ENTEROTOXIGENIC E COLI (ETEC): NOT DETECTED
Entamoeba histolytica: NOT DETECTED
GIARDIA LAMBLIA: NOT DETECTED
Norovirus GI/GII: NOT DETECTED
PLESIMONAS SHIGELLOIDES: NOT DETECTED
ROTAVIRUS A: NOT DETECTED
SHIGA LIKE TOXIN PRODUCING E COLI (STEC): NOT DETECTED
Salmonella species: NOT DETECTED
Sapovirus (I, II, IV, and V): NOT DETECTED
Shigella/Enteroinvasive E coli (EIEC): NOT DETECTED
Vibrio cholerae: NOT DETECTED
Vibrio species: NOT DETECTED
Yersinia enterocolitica: NOT DETECTED

## 2015-04-12 LAB — T-HELPER CELLS (CD4) COUNT (NOT AT ARMC)
CD4 T CELL ABS: 130 /uL — AB (ref 400–2700)
CD4 T CELL HELPER: 18 % — AB (ref 33–55)

## 2015-04-12 LAB — ECHOCARDIOGRAM COMPLETE
Height: 62 in
Weight: 1770.73 oz

## 2015-04-12 LAB — C DIFFICILE QUICK SCREEN W PCR REFLEX
C DIFFICILE (CDIFF) INTERP: POSITIVE
C DIFFICILE (CDIFF) TOXIN: POSITIVE — AB
C Diff antigen: POSITIVE — AB

## 2015-04-12 LAB — LACTIC ACID, PLASMA: LACTIC ACID, VENOUS: 0.7 mmol/L (ref 0.5–2.0)

## 2015-04-12 MED ORDER — DARUNAVIR-COBICISTAT 800-150 MG PO TABS
1.0000 | ORAL_TABLET | Freq: Every day | ORAL | Status: DC
  Administered 2015-04-12 – 2015-04-13 (×2): 1 via ORAL
  Filled 2015-04-12 (×3): qty 1

## 2015-04-12 MED ORDER — POTASSIUM CHLORIDE CRYS ER 20 MEQ PO TBCR
40.0000 meq | EXTENDED_RELEASE_TABLET | Freq: Once | ORAL | Status: AC
  Administered 2015-04-12: 40 meq via ORAL
  Filled 2015-04-12: qty 2

## 2015-04-12 MED ORDER — METRONIDAZOLE IN NACL 5-0.79 MG/ML-% IV SOLN
500.0000 mg | Freq: Three times a day (TID) | INTRAVENOUS | Status: DC
  Administered 2015-04-12 – 2015-04-13 (×4): 500 mg via INTRAVENOUS
  Filled 2015-04-12 (×4): qty 100

## 2015-04-12 MED ORDER — SODIUM CHLORIDE 0.9 % IV SOLN
INTRAVENOUS | Status: AC
  Administered 2015-04-12: 14:00:00 via INTRAVENOUS

## 2015-04-12 MED ORDER — ENSURE ENLIVE PO LIQD: 237.0000 mL | Freq: Two times a day (BID) | ORAL | Status: DC

## 2015-04-12 MED ORDER — DOLUTEGRAVIR SODIUM 50 MG PO TABS
50.0000 mg | ORAL_TABLET | Freq: Two times a day (BID) | ORAL | Status: DC
  Administered 2015-04-12 – 2015-04-13 (×3): 50 mg via ORAL
  Filled 2015-04-12 (×3): qty 1

## 2015-04-12 MED ORDER — LAMIVUDINE-ZIDOVUDINE 150-300 MG PO TABS
1.0000 | ORAL_TABLET | Freq: Two times a day (BID) | ORAL | Status: DC
  Administered 2015-04-12 – 2015-04-13 (×3): 1 via ORAL
  Filled 2015-04-12 (×3): qty 1

## 2015-04-12 MED ORDER — OSELTAMIVIR PHOSPHATE 75 MG PO CAPS
75.0000 mg | ORAL_CAPSULE | Freq: Two times a day (BID) | ORAL | Status: DC
Start: 2015-04-12 — End: 2015-04-12

## 2015-04-12 MED ORDER — OSELTAMIVIR PHOSPHATE 6 MG/ML PO SUSR
75.0000 mg | Freq: Two times a day (BID) | ORAL | Status: DC
Start: 2015-04-12 — End: 2015-04-23

## 2015-04-12 MED FILL — TAMIFLU 6 MG/ML SUSPENSION: 6 | 3 days supply | Qty: 75 | Fill #0

## 2015-04-12 NOTE — Addendum Note (Signed)
Addended by: Mliss FritzKIM, Shera Laubach J on: 04/12/2015 04:11 PM   Modules accepted: Orders, Medications

## 2015-04-12 NOTE — Progress Notes (Signed)
Lab called patient is positive for C.diff.

## 2015-04-12 NOTE — Progress Notes (Signed)
  Echocardiogram 2D Echocardiogram has been performed.  Janalyn HarderWest, Sharyl Panchal R 04/12/2015, 3:41 PM

## 2015-04-12 NOTE — Progress Notes (Signed)
Patient arrived on unit via stretcher. Patient placed on enteric and droplet precautions. Patient alert and oriented x4. Patient oriented to room, unit and staff. Patient's mother at the bedside. Patient placed on telemetry monitor, CCMD notified. Skin assessment completed with Burley SaverKami Moore, RN, check flowsheets. Patient's IV clean, dry and infusing. Pain denies pain. Safety Fall Prevention Plan was given, discussed and signed by patient. Orders have been reviewed and implemented. Call light has been placed within reach and bed alarm has been activated. RN will continue to monitor the patient.   Rivka BarbaraZenab Malik Paar BSN, RN  Phone Number: [phone]

## 2015-04-12 NOTE — Progress Notes (Signed)
Initial Nutrition Assessment  DOCUMENTATION CODES:   Not applicable  INTERVENTION:  Provide Ensure Enlive po BID, each supplement provides 350 kcal and 20 grams of protein.  Encourage adequate PO intake.   NUTRITION DIAGNOSIS:   Increased nutrient needs related to chronic illness as evidenced by estimated needs.  GOAL:   Patient will meet greater than or equal to 90% of their needs  MONITOR:   PO intake, Supplement acceptance, Weight trends, Labs, I & O's  REASON FOR ASSESSMENT:   Malnutrition Screening Tool    ASSESSMENT:   72F w/ PMHx of AIDS (CD4 count 174), untreated Hepatitis C, and post herpetic neuralgia who presents with 2-3 days of shortness of breath, cough, and chest heaviness. Yesterday she had around 10 watery BMs. She endorses fevers, weakness, decreased appetite, and n/v. Her emesis consists of green fluid. She was seen by Dr. Drue SecondSnider today and sent to the ED for dehydration. In the ED a CXR was positive for a slightly improved residual mild patchy airspace disease in the RLL that was suspicious for residual or recurrent pneumonia.   Pt has been just advanced to a regular diet. Meal completion from clear liquid diet this AM was 40%. Pt reports hunger during time of visit. She reports she has been eating well PTA with consumption of at least 3 meals a day. Usual body weight of ~115-120 lbs. Pt with a 4.3% weight loss. RD to order Ensure to aid in caloric and protein needs. Pt encouraged to eat her food at meals.   Nutrition-Focused physical exam completed. Findings are no fat depletion, moderate to severe muscle depletion, and no edema.   Labs and medications reviewed.   Diet Order:  Diet regular Room service appropriate?: Yes; Fluid consistency:: Thin  Skin:  Reviewed, no issues  Last BM:  3/10  Height:   Ht Readings from Last 1 Encounters:  04/11/15 5\' 2"  (1.575 m)    Weight:   Wt Readings from Last 1 Encounters:  04/11/15 110 lb 10.7 oz (50.2 kg)     Ideal Body Weight:  50 kg  BMI:  Body mass index is 20.24 kg/(m^2).  Estimated Nutritional Needs:   Kcal:  1650-1900  Protein:  75-85 grams  Fluid:  1.6 - 1.9 L/day  EDUCATION NEEDS:   No education needs identified at this time  Roslyn SmilingStephanie Malika Demario, MS, RD, LDN Pager # [phone] After hours/ weekend pager # [phone]

## 2015-04-12 NOTE — Progress Notes (Signed)
Tamiflu provided and reviewed with the patient, including name, instructions, indication, goals of therapy, potential side effects, importance of adherence, and safe use.  Patient verbalized understanding by repeating back information and was advised to contact me if further medication-related questions arise. Patient was also provided an information handout.

## 2015-04-12 NOTE — Progress Notes (Signed)
Subjective: The patient reports she feels 'much better'. Has not vomited since yesterday. Had diarrhea today. Asking for regular diet.  Objective: Vital signs in last 24 hours: Filed Vitals:   04/11/15 2000 04/11/15 2027 04/12/15 0539 04/12/15 0757  BP: 120/80 111/66 118/72 108/69  Pulse: 121 121 115 85  Temp:  99.4 F (37.4 C) 99.1 F (37.3 C) 98 F (36.7 C)  TempSrc:  Oral Oral Oral  Resp: Height:   (1.575 m)    Weight:  50.2 kg (110 lb 10.7 oz)    SpO2: 93% 95% 96% 94%   Weight change:   Intake/Output Summary (Last 24 hours) at 04/12/15 1058 Last data filed at 04/12/15 0600  Gross per 24 hour  Intake   1480 ml  Output      0 ml  Net   1480 ml    Physical Exam General Appearance:    Very improved. Alert, cooperative, smiling. Visibly more hydrated.  Head:    Normocephalic, without obvious abnormality, atraumatic  Eyes:    conjunctiva/corneas clear, EOM's intact.  Nose:   Nares normal,no rhinorrhe  Throat:   Eroding teeth. Oral mucosa continues to be a bit dry  Back:     Symmetric, no curvature, ROM normal, no CVA tenderness  Lungs:     Clear to auscultation bilaterally, respirations unlabored  Chest Wall:    No tenderness or deformity   Heart:    Regular rate and rhythm, S1 and S2 normal, no murmur, rub   or gallop  Abdomen:     Soft, non-tender, no masses, no organomegaly  Extremities:   Extremities normal, atraumatic, no cyanosis or edema  Skin:   Skin color, texture, turgor normal, no rashes or lesions  Neurologic:   Alert and oriented.    Lab Results: Basic Metabolic Panel:  Recent Labs  16/10/96 1329 04/12/15 0835  NA 139 140  K 3.6 3.2*  CL 97* 106  CO2 21* 22  GLUCOSE 127* 91  BUN 30* 18  CREATININE 1.79* 1.09*  CALCIUM 10.2 8.1*   Liver Function Tests:  Recent Labs  04/11/15 1329  AST 30  ALT 18  ALKPHOS 69  BILITOT 1.0  PROT 9.6*  ALBUMIN 4.5    Recent Labs  04/11/15 1503  LIPASE 25   No results for input(s):  AMMONIA in the last 72 hours. CBC:  Recent Labs  04/11/15 1329 04/12/15 0835  WBC 12.9* 4.4  NEUTROABS 11.6* PENDING  HGB 14.2 9.5*  HCT 43.0 30.2*  MCV 81.0 82.7  PLT 237 PENDING   Cardiac Enzymes:  Recent Labs  04/11/15 1718  TROPONINI 0.04*   BNP: No results for input(s): PROBNP in the last 72 hours. D-Dimer: No results for input(s): DDIMER in the last 72 hours. CBG: No results for input(s): GLUCAP in the last 72 hours. Hemoglobin A1C: No results for input(s): HGBA1C in the last 72 hours. Fasting Lipid Panel: No results for input(s): CHOL, HDL, LDLCALC, TRIG, CHOLHDL, LDLDIRECT in the last 72 hours. Thyroid Function Tests: No results for input(s): TSH, T4TOTAL, FREET4, T3FREE, THYROIDAB in the last 72 hours. Anemia Panel: No results for input(s): VITAMINB12, FOLATE, FERRITIN, TIBC, IRON, RETICCTPCT in the last 72 hours. Coagulation: No results for input(s): LABPROT, INR in the last 72 hours. Urine Drug Screen: Drugs of Abuse  No results found for: LABOPIA, COCAINSCRNUR, LABBENZ, AMPHETMU, THCU, LABBARB  Alcohol Level: No results for input(s): ETH in the last 72 hours. Urinalysis:  Recent  Labs  04/11/15 1756  COLORURINE YELLOW  LABSPEC 1.030  PHURINE 5.0  GLUCOSEU NEGATIVE  HGBUR TRACE*  BILIRUBINUR MODERATE*  KETONESUR 15*  PROTEINUR 100*  NITRITE NEGATIVE  LEUKOCYTESUR LARGE*     Micro Results: Recent Results (from the past 240 hour(s))  C difficile quick scan w PCR reflex     Status: Abnormal   Collection Time: 04/12/15  6:12 AM  Result Value Ref Range Status   C Diff antigen POSITIVE (A) NEGATIVE Final   C Diff toxin POSITIVE (A) NEGATIVE Final   C Diff interpretation Positive for toxigenic C. difficile  Final    Comment: CRITICAL RESULT CALLED TO, READ BACK BY AND VERIFIED WITH: R. MARAMBAS RN 04/12/15 AT 0936 BY A. DAVIS    Studies/Results: Dg Chest 2 View  04/11/2015  CLINICAL DATA:  Cough, congestion, fever, HIV-positive, elevated CD4  counts EXAM: CHEST  2 VIEW COMPARISON:  03/15/2015 FINDINGS: Cardiomediastinal silhouette is stable. No pulmonary edema. Persistent streaky airspace disease in left base. Residual mild patchy airspace is in right lower lobe with slight improvement from prior exam. IMPRESSION: No pulmonary edema. Persistent streaky airspace disease in left base. Residual mild patchy airspace is in right lower lobe with slight improvement from prior exam. Findings suspicious for residual or recurrent pneumonia. Electronically Signed   By: Natasha MeadLiviu  Pop M.D.   On: 04/11/2015 13:49   Medications:  . heparin  5,000 Units Subcutaneous 3 times per day  . metronidazole  500 mg Intravenous Q8H  . oseltamivir  75 mg Oral BID  . pregabalin  75 mg Oral BID   acetaminophen **OR** acetaminophen, albuterol, ondansetron  Assessment/Plan: Principal Problem:   Influenza A H1N1 infection Active Problems:   AIDS (acquired immune deficiency syndrome) (HCC)   HCAP (healthcare-associated pneumonia)   Gastroenteritis   AKI (acute kidney injury) (HCC)   Infection due to trichomonas   ##C. Diff Enterocolitis with associated dehydration and AKI, improved The patient presented with nausea, vomiting, and profuse watery diarrhea x 1 day. Her boyfriend experienced similar symptoms a few days ago which have since resolved. At presentation the patient was clearly dehydrated with sunken eyes, dry mucus membranes, and tachycardia, and lactic acid 2.97. She had recently finished a course of cefuroxime for treatment of HCAP. She was found to be H1N1+ and C Diff antigen and toxin +. Her creatine at presentation was 1.8, she received IVF and today it has trended down to 1.1. Her lactic acid has normalized (it is now 0.7). Treatments - Continue IVF: 125 ml/hr x12 hours - Continue Zofran 4mg  q8h prn - Continue Tylenol 650 mg q6h prn for pain - Per patient's request, will advance to regular diet - Begin metronidazole 500 mg TID for treatment of C  Diff and Trichomonas vaginalis Diagnostics - Follow up GI pathogen panel  ##Trichomonas Vaginalis infection The patient received a UA in the ED and was incidentally found to be trichomonas +. She denies symptoms of pruritus, dysuria, or vaginal discharge.  - Patient counseled with regards to sexual transmission of trichomonas and was suggested she inform her boyfriend/sexual partner - metronidazole 500 mg TID as above  ##HIV positive status The patient was diagnosed with HIV in ~2010 during routine screening for her first pregnancy. Her last HIV results in [facility] are in February (CD4 count 100, viral load 180), however her labs in clinic 04/11/2015 were reportedly CD4 count 174 and viral load 110. She reports that she takes her HIV medications every day and does not skip  doses or have trouble filling her medications - Continue home HIV medications (prezcobix, dolutegravir, combivir)  ##Shortness of breath, resolved The patient presents with SOB x3 days. She was recently treated at [facility] ~1 month ago (discharged 03/18/15) with cefepime for HCAP. During that admission, sputum test for PCP was negative. After discharge, she felt her shortness of breath temporarily resolved but recently returned. CXR at presentation showed small RLL patchy infiltrate. She was initially started on cefepime, but her oxygen saturation has been >95% on room air since admission and she has normal work of breathing. Her shortness of breath has resolved. Will DC cefepime today. Treatments - Continue home albuterol 1 puff q6h prn - DC cefepime   Chest Discomfort, resolved At presentation the patient endorsed constant chest discomfort 'like someone is sitting on my chest' and also had shortness of breath. She denied chest pain. EKG was normal, troponin negative. - Continue to monitor  Shingles The patient has a history of shingles on her back. She also has a rash on her arm which has been present for the past 1 month and  is not pruritic or painful. - Continue home lyrica 75 mg   This is a Psychologist, occupational Note.  The care of the patient was discussed with Dr. Danella Penton and the assessment and plan formulated with their assistance.  Please see their attached note for official documentation of the daily encounter.   LOS: 1 day   Rosebud Poles, Med Student 04/12/2015, 10:58 AM

## 2015-04-12 NOTE — Progress Notes (Addendum)
Assistance with transitions of care per Dr. Kyung RuddKennedy. Formulary change to suspension per availability and prescription coverage plan.

## 2015-04-12 NOTE — Progress Notes (Signed)
Utilization review completed. Neville Walston, RN, BSN. 

## 2015-04-13 LAB — URINE CULTURE

## 2015-04-13 MED ORDER — METRONIDAZOLE 500 MG PO TABS: 500.0000 mg | ORAL_TABLET | Freq: Three times a day (TID) | ORAL | Status: DC

## 2015-04-13 MED ORDER — METRONIDAZOLE 250 MG PO TABS: 250.0000 mg | ORAL_TABLET | Freq: Three times a day (TID) | ORAL | Status: DC

## 2015-04-13 NOTE — Discharge Instructions (Signed)
Take flagyl 500 mg three times a day for 12 more days.   Finish your course of tamiflu that Dr. Selena BattenKim gave to you for the flu.  Make sure you call Dr. Zenaida NieceVan Dam's office and schedule an appointment with him as soon as possible for hospital follow up.    Clostridium Difficile Infection Clostridium difficile (C. difficile or C. diff) is a germ found in the intestines. C. difficile infection can happen after taking antibiotic medicines. Antiobiotics may cause the C. difficile to grow out of control and make a toxin that causes the infection. C. difficile infection can cause watery poop (diarrhea) or severe disease. HOME CARE  Drink enough fluids to keep your pee (urine) clear or pale yellow.   Take your antibiotics as told. Finish them even if you start to feel better.  Do not  take medicines that help watery poop (like Imodium). These medicines may stop you from healing as fast or cause problems.  ALWAYS wash your hands after using the bathroom and before touching food. Make sure people who live with you wash their hands often.  Clean all surfaces in your home. Use a product that has chlorine bleach in it. GET HELP IF:  Your watery poop lasts longer than expected.  Your watery poop comes back after you finish your antibiotic medicine.  You feel very dry or thirsty (dehydrated).  You have a fever. GET HELP RIGHT AWAY IF:   You have more stomach pain or tenderness.  You have blood in your poop (stool). Your poop may look black and tar-like.  You cannot eat or drink without throwing up (vomiting).   This information is not intended to replace advice given to you by your health care provider. Make sure you discuss any questions you have with your health care provider.       Trichomoniasis Trichomoniasis is an infection caused by an organism called Trichomonas. It is a sexually transmitted infection (STI) and is most often passed to another person through sexual contact.  The  infection can affect both women and men. Both men and women can experience no symptoms before they start having the symptoms listed below. RISK FACTORS  Having unprotected sexual intercourse (without a condom)  Having sexual intercourse with an infected partner. SIGNS AND SYMPTOMS  Symptoms of trichomoniasis in women include:  Abnormal gray-green frothy vaginal discharge.  Itching and irritation of the vagina.  Itching and irritation of the area outside the vagina. Symptoms of trichomoniasis in men include:   Penile discharge with or without pain.  Pain during urination. This results from inflammation of the urethra. DIAGNOSIS  Trichomoniasis may be found during a Pap test, urine test, or physical exam. Your health care provider may use one of the following methods to help diagnose this infection:  Testing the pH of the vagina with a test tape.  Using a vaginal swab test that checks for the Trichomonas organism.   Examining a urine sample.  Testing vaginal secretions. TREATMENT   Your sexual partner will need to be treated if infected.  Metronidazole is a medication that can treat the infection. HOME CARE INSTRUCTIONS   Women should not douche or wear tampons while they have the infection.  Discuss your infection with your partner. Your partner may have gotten the infection from you, or you may have gotten it from your partner.  Have your sex partner get examined and treated if necessary.  Practice safe, informed, and protected sex.  See your health care  provider for other STI testing. SEEK MEDICAL CARE IF:   You still have symptoms after you finish your medicine.  You develop abdominal pain.  You have pain when you urinate.  You have bleeding after sexual intercourse.  You develop a rash.  Your medicine makes you sick or makes you throw up (vomit). MAKE SURE YOU:  Understand these instructions.  Will watch your condition.  Will get help right away if  you are not doing well or get worse.   This information is not intended to replace advice given to you by your health care provider. Make sure you discuss any questions you have with your health care provider.

## 2015-04-13 NOTE — Progress Notes (Signed)
Subjective: Pt had 2 soft formed BMs today. Tolerating diet. States she feels like she has regained her strength and would like to be d/c'd home today if able.  Objective: Vital signs in last 24 hours: Filed Vitals:   04/12/15 1547 04/12/15 2128 04/13/15 0454 04/13/15 0924  BP: 105/72 107/74 102/65 105/69  Pulse: 96 109 83 82  Temp: 98.3 F (36.8 C) 98.1 F (36.7 C) 97.9 F (36.6 C) 98.2 F (36.8 C)  TempSrc: Oral   Oral  Resp: Height:      Weight:      SpO2: 95% 91% 97% 95%   Weight change:   Intake/Output Summary (Last 24 hours) at 04/13/15 1610 Last data filed at 04/13/15 0600  Gross per 24 hour  Intake   1080 ml  Output    700 ml  Net    380 ml   General: NAD, laying in bed comfortably, ill appearing Lungs: CTAB, no wheezing Cardiac: RRR, no murmurs GI: soft, active bowel sounds Neuro: CN II-XII grossly intact  Lab Results: Basic Metabolic Panel:  Recent Labs Lab 04/11/15 1329 04/12/15 0835  NA 139 140  K 3.6 3.2*  CL 97* 106  CO2 21* 22  GLUCOSE 127* 91  BUN 30* 18  CREATININE 1.79* 1.09*  CALCIUM 10.2 8.1*   Liver Function Tests:  Recent Labs Lab 04/11/15 1329  AST 30  ALT 18  ALKPHOS 69  BILITOT 1.0  PROT 9.6*  ALBUMIN 4.5    Recent Labs Lab 04/11/15 1503  LIPASE 25   CBC:  Recent Labs Lab 04/11/15 1329 04/12/15 0835  WBC 12.9* 4.4  NEUTROABS 11.6* 3.4  HGB 14.2 9.5*  HCT 43.0 30.2*  MCV 81.0 82.7  PLT 237 172   Cardiac Enzymes:  Recent Labs Lab 04/11/15 1718  TROPONINI 0.04*   Urinalysis:  Recent Labs Lab 04/11/15 1756  COLORURINE YELLOW  LABSPEC 1.030  PHURINE 5.0  GLUCOSEU NEGATIVE  HGBUR TRACE*  BILIRUBINUR MODERATE*  KETONESUR 15*  PROTEINUR 100*  NITRITE NEGATIVE  LEUKOCYTESUR LARGE*     Micro Results: Recent Results (from the past 240 hour(s))  Culture, blood (routine x 2)     Status: None (Preliminary result)   Collection Time: 04/11/15  1:30 PM  Result Value Ref Range Status     Specimen Description BLOOD RIGHT ANTECUBITAL  Final   Special Requests BOTTLES DRAWN AEROBIC AND ANAEROBIC 5CC  Final   Culture NO GROWTH < 24 HOURS  Final   Report Status PENDING  Incomplete  Culture, blood (routine x 2)     Status: None (Preliminary result)   Collection Time: 04/11/15  2:44 PM  Result Value Ref Range Status   Specimen Description BLOOD CENTRAL LINE  Final   Special Requests IN PEDIATRIC BOTTLE 4CC  Final   Culture NO GROWTH < 24 HOURS  Final   Report Status PENDING  Incomplete  Urine culture     Status: None (Preliminary result)   Collection Time: 04/11/15  5:57 PM  Result Value Ref Range Status   Specimen Description URINE, RANDOM  Final   Special Requests NONE  Final   Culture TOO YOUNG TO READ  Final   Report Status PENDING  Incomplete  C difficile quick scan w PCR reflex     Status: Abnormal   Collection Time: 04/12/15  6:12 AM  Result Value Ref Range Status   C Diff antigen POSITIVE (A) NEGATIVE Final   C Diff toxin POSITIVE (  A) NEGATIVE Final   C Diff interpretation Positive for toxigenic C. difficile  Final    Comment: CRITICAL RESULT CALLED TO, READ BACK BY AND VERIFIED WITH: R. MARAMBAS RN 04/12/15 AT 0936 BY A. DAVIS   Gastrointestinal Panel by PCR , Stool     Status: None   Collection Time: 04/12/15  6:12 AM  Result Value Ref Range Status   Campylobacter species NOT DETECTED NOT DETECTED Final   Plesimonas shigelloides NOT DETECTED NOT DETECTED Final   Salmonella species NOT DETECTED NOT DETECTED Final   Yersinia enterocolitica NOT DETECTED NOT DETECTED Final   Vibrio species NOT DETECTED NOT DETECTED Final   Vibrio cholerae NOT DETECTED NOT DETECTED Final   Enteroaggregative E coli (EAEC) NOT DETECTED NOT DETECTED Final   Enteropathogenic E coli (EPEC) NOT DETECTED NOT DETECTED Final   Enterotoxigenic E coli (ETEC) NOT DETECTED NOT DETECTED Final   Shiga like toxin producing E coli (STEC) NOT DETECTED NOT DETECTED Final   E. coli O157 NOT  DETECTED NOT DETECTED Final   Shigella/Enteroinvasive E coli (EIEC) NOT DETECTED NOT DETECTED Final   Cryptosporidium NOT DETECTED NOT DETECTED Final   Cyclospora cayetanensis NOT DETECTED NOT DETECTED Final   Entamoeba histolytica NOT DETECTED NOT DETECTED Final   Giardia lamblia NOT DETECTED NOT DETECTED Final   Adenovirus F40/41 NOT DETECTED NOT DETECTED Final   Astrovirus NOT DETECTED NOT DETECTED Final   Norovirus GI/GII NOT DETECTED NOT DETECTED Final   Rotavirus A NOT DETECTED NOT DETECTED Final   Sapovirus (I, II, IV, and V) NOT DETECTED NOT DETECTED Final   Studies/Results: Dg Chest 2 View  04/11/2015  CLINICAL DATA:  Cough, congestion, fever, HIV-positive, elevated CD4 counts EXAM: CHEST  2 VIEW COMPARISON:  03/15/2015 FINDINGS: Cardiomediastinal silhouette is stable. No pulmonary edema. Persistent streaky airspace disease in left base. Residual mild patchy airspace is in right lower lobe with slight improvement from prior exam. IMPRESSION: No pulmonary edema. Persistent streaky airspace disease in left base. Residual mild patchy airspace is in right lower lobe with slight improvement from prior exam. Findings suspicious for residual or recurrent pneumonia. Electronically Signed   By: Natasha Mead M.D.   On: 04/11/2015 13:49   Medications: I have reviewed the patient's current medications. Scheduled Meds: . darunavir-cobicistat  1 tablet Oral Daily  . dolutegravir  50 mg Oral BID  . feeding supplement (ENSURE ENLIVE)  237 mL Oral BID BM  . heparin  5,000 Units Subcutaneous 3 times per day  . lamiVUDine-zidovudine  1 tablet Oral BID  . metronidazole  500 mg Intravenous Q8H  . oseltamivir  75 mg Oral BID  . pregabalin  75 mg Oral BID   Continuous Infusions:  PRN Meds:.acetaminophen **OR** acetaminophen, albuterol, ondansetron Assessment/Plan: Principal Problem:   C. difficile colitis Active Problems:   AIDS (acquired immune deficiency syndrome) (HCC)   HCAP  (healthcare-associated pneumonia)   AKI (acute kidney injury) (HCC)   Infection due to trichomonas   Influenza A H1N1 infection   Sepsis (HCC)  1. C. Diff colitis- improving, tolerating regular diet, afebrile, and stools more formed.  - continue IV flagyl  q8h  2. Influenza A- - continue tamiflu, pharmacy has provided her with this medication to take home  3. AKI--Creatinine trending down to 1.09 yesterday. Pt refusing lab drawl this morning.  - will continue to monitor, anticipate continual improvement of creatinine now that pt is tolerating po liquids and having less diarrhea.   4. AIDS-- - continue home meds, will  need closer ID follow up at ID clinic in [place] rather than in [place].     Dispo: d/c home today or tomorrow.   The patient does have a current PCP ([place]ittany Hout, PA-C) and does not need an [facility] hospital follow-up appointment after discharge.  The patient does not have transportation limitations that hinder transportation to clinic appointments.  .Services Needed at time of discharge: Y = Yes, Blank = No PT:   OT:   RN:   Equipment:   Other:     LOS: 2 days   Denton Brickiana M Moksh Loomer, MD 04/13/2015, 9:27 AM

## 2015-04-13 NOTE — Discharge Summary (Signed)
Name: Frances Ball MRN: [id number] DOB: 01-09-1991 25 y.o. PCP: Tanna Furry, PA-C  Date of Admission: 04/11/2015  2:15 PM Date of Discharge: 04/13/2015 Attending Physician: Randall Hiss, MD  Discharge Diagnosis: Principal Problem:   C. difficile colitis Active Problems:   AIDS (acquired immune deficiency syndrome) (HCC)   Infection due to trichomonas   Influenza A H1N1 infection  Discharge Medications:   Medication List    STOP taking these medications        cefUROXime 500 MG tablet  Commonly known as:  CEFTIN      TAKE these medications        albuterol 108 (90 Base) MCG/ACT inhaler  Commonly known as:  PROVENTIL HFA;VENTOLIN HFA  Inhale 1 puff into the lungs every 6 (six) hours as needed for wheezing or shortness of breath.     COMBIVIR 150-300 MG tablet  Generic drug:  lamiVUDine-zidovudine  Take 1 tablet by mouth 2 (two) times daily.     dapsone 25 MG tablet  Take 50 mg by mouth daily.     metroNIDAZOLE 500 MG tablet  Commonly known as:  FLAGYL  Take 1 tablet (500 mg total) by mouth 3 (three) times daily.     oseltamivir 6 MG/ML Susr suspension  Commonly known as:  TAMIFLU  Take 12.5 mLs (75 mg total) by mouth 2 (two) times daily. MEDICAID [id number]     pregabalin 75 MG capsule  Commonly known as:  LYRICA  Take 1 capsule (75 mg total) by mouth 2 (two) times daily.     PREZCOBIX 800-150 MG tablet  Generic drug:  darunavir-cobicistat  Take 1 tablet by mouth daily.     TIVICAY 50 MG tablet  Generic drug:  dolutegravir  Take 1 tablet by mouth 2 (two) times daily.        Disposition and follow-up:   Ms.Frances Ball was discharged from [facility] in Good condition.  At the hospital follow up visit please address:  1.  Dr. Selena Batten with pharmacy patient with tamiflu. Please ensure that pt complete her course. She was also discharged on flagyl 500 mg po x 12 days for a total of 14 days (end date 3/23).   2.  Labs / imaging  needed at time of follow-up: BMET, CBC  3.  Pending labs/ test needing follow-up: none  Follow-up Appointments: Follow-up Information    Follow up with Ball, BRITTANY, PA-C.   Specialty:  Physician Assistant   Contact information:   [facility] [address] [place] [address] [phone]       Schedule an appointment as soon as possible for a visit with Frances Lav, MD.   Specialty:  Infectious Diseases   Contact information:   [address] [place] [address] [phone]       Procedures Performed:  Dg Chest 2 View  04/11/2015  CLINICAL DATA:  Cough, congestion, fever, HIV-positive, elevated CD4 counts EXAM: CHEST  2 VIEW COMPARISON:  03/15/2015 FINDINGS: Cardiomediastinal silhouette is stable. No pulmonary edema. Persistent streaky airspace disease in left base. Residual mild patchy airspace is in right lower lobe with slight improvement from prior exam. IMPRESSION: No pulmonary edema. Persistent streaky airspace disease in left base. Residual mild patchy airspace is in right lower lobe with slight improvement from prior exam. Findings suspicious for residual or recurrent pneumonia. Electronically Signed   By: Natasha Mead M.D.   On: 04/11/2015 13:49  Dg Chest 2 View  03/15/2015  CLINICAL DATA:  Cough and pneumonia for 2 weeks. EXAM: CHEST  2 VIEW COMPARISON:  03/14/2015 FINDINGS: Cardiomediastinal silhouette is normal. Mediastinal contours appear intact. There is no evidence of pleural effusion or pneumothorax. There is persistent patchy left lower lobe airspace consolidation. Smaller isolated areas of consolidation are also seen in the right mid lung. Osseous structures are without acute abnormality. Soft tissues are grossly normal. IMPRESSION: Persist the left lower lobe airspace consolidation. Smaller isolated areas of airspace consolidation are also seen in the right mid lung, suggestive of multifocal pneumonia. Electronically  Signed   By: Ted Mcalpine M.D.   On: 03/15/2015 09:29    2D Echo: 04/12/2015 Study Conclusions  - Left ventricle: The cavity size was normal. Systolic function was  normal. The estimated ejection fraction was in the range of 55%  to 60%. Wall motion was normal; there were no regional wall  motion abnormalities. Left ventricular diastolic function  parameters were normal. - Aortic valve: Trileaflet; normal thickness leaflets. There was no  regurgitation. - Aortic root: The aortic root was normal in size. - Left atrium: The atrium was normal in size. - Right ventricle: The cavity size was normal. Wall thickness was  normal. Systolic function was normal. - Right atrium: The atrium was normal in size. - Tricuspid valve: There was trivial regurgitation. - Pulmonary arteries: Systolic pressure was within the normal  range. - Inferior vena cava: The vessel was normal in size. - Pericardium, extracardiac: There was no pericardial effusion.    Admission HPI: 77F w/ PMHx of AIDS (CD4 count 174), untreated Hepatitis C, and post herpetic neuralgia who presents with 2-3 days of shortness of breath, cough, and chest heaviness. Yesterday she had around 10 watery BMs. She endorses fevers, weakness, decreased appetite, and n/v. Her emesis consists of green fluid. Her boyfriend who she lives with has had recent nausea and vomiting. She was seen by Dr. Drue Second today and sent to the ED for dehydration. In the ED a CXR was positive for a slightly improved residual mild patchy airspace disease in the RLL that was suspicious for residual or recurrent pneumonia. Her lactic acid was elevated at 2.97, WBC elevated at 12.9 ( where as a month prior she was leukopenic at 1.7), RR 21, and tachycardiac.  Pt was last admitted on February 10-13th for healthcare associated pneumonia that was treated with cefuroxime  BID with end date of 2/19 which she states she completed. She has been complaint with  all of her HIV meds except for dapsone which she states she ran out of. She has mail order pharmacy and states she does not have difficulty obtaining meds.   Hospital Course by problem list: Principal Problem:   C. difficile colitis Active Problems:   AIDS (acquired immune deficiency syndrome) (HCC)   Infection due to trichomonas   Influenza A H1N1 infection   C Diff vs. H1N1 Gastroenteritis with associated dehydration and AKI-- The patient presented with nausea, vomiting, and diarrhea for the past 1 day. She reported vomiting clear, green-tinged liquid >10 times in the past 24 hours. She endorsed profuse watery diarrhea. Her boyfriend, whom she spent time with recently, had vomiting and diarrhea a few days ago which had since resolved. On physical exam the patient had sunken eyes and dry mucus membranes. EKG showed tachycardia. She had recently finished a course of cefuroxime for treatment of HCAP. She had modest leukocytosis with WBC 12.9 although at baseline she is leukopenic.  Her creatinine was elevated to 1.8 from baseline of ~0.8, indicating AKI. She received a 1L bolus of NS and was started on IV fluids, Zofran, and a clear liquid diet. On hospital day 1, her creatinine trended down to 1.1 and she endorsed feeling much better. She was found to be positive for H1N1 as well as C. Diff toxin and antigen. She received treatment with Tamiflu and metronidazole. On hospital day 2, she denied vomiting but continued to endorse diarrhea. On hospital day 3, her symptoms resolved. A GI pathogen panel was negative for giardia and cryptosporidium, among other pathogens. She was discharged home with a free sample of Tamiflu and prescription for metronidazole. On day of discharge patient refused blood draw.  Trichomonas Vaginalis The patient was incidentally found to be positive for trichomonas on her urinalysis. She denied symptoms of pruritus, dysuria, dyspareunia, or vaginal discharged. She received  treatment with metronidazole, which also served to treat her C. Diff infection. She was counseled with regards to sexual transmission of trichomonas and was advised to inform her boyfriend.  HIV Status The patient was diagnosed with HIV in ~2010 during routine screening for her first pregnancy. On the day of admission, she reported that she had recently gotten her labs checked in clinic, and her CD4 count was found to be 174 with a viral load of 110. [facility], her last CD4 count was 100 on 03/16/2015. The patient reported that she takes her HIV medications every day and does not skip doses or have trouble filling her medications. She was restarted on her home HIV medications (prezcobix, dolutegravir, combivir) which she took throughout her admission. She was discharged with instructions to continue her home regimen. CD4 count checked this admission was 130 on 04/11/15.   Shortness of breath in the setting of HIV status and recent HCAP At presentation, the patient endorsed SOB x3 days, although her symptoms of nausea, vomiting, and diarrhea were her chief complaint. She had been recently [facility] ~1 month prior (discharged 03/18/15) with cefepime for HCAP. During that admission, sputum test for PCP was negative. After discharge, she felt her shortness of breath completely resolved, but in the prior 3 days had returned. A chest x-ray in the ED showed a small RLL patchy infiltrate. She was empirically treated with cefepime and received 1 dose. On physical exam the patient had normal work of breathing, clear lungs, and oxygen saturation >95% on room air. Pneumonia was felt to be an unlikely diagnosis, and cefepime was discontinued. The patient's shortness of breath resolved.   Chest Discomfort At presentation, the patient endorsed chest discomfort 'like someone is sitting on my chest' and also complained of shortness of breath. She denied overt chest pain. Her troponin was found to be negative.  EKG showed tachycardia with inverted T waves. Repeat EKG on hospital day 1 showed normal sinus rhythm. The patient's chest discomfort resolved.   Shingles The patient has a history of shingles on her back. At presentation she also had a rash on her arm which has been present for the past 1 month and was not pruritic or painful. She was restarted on her home lyrica 75 mg daily. The rash on her arm gradually improved. The patient had no complaints with regards to this issue.  Discharge Vitals:   BP 105/69 mmHg  Pulse 82  Temp(Src) 98.2 F (36.8 C) (Oral)  Resp 16  Ht 5\' 2"  (1.575 m)  Wt 110 lb 10.7 oz (50.2 kg)  BMI 20.24 kg/m2  SpO2 95%  LMP 03/15/2015  Discharge Labs:  Basic Metabolic Panel:  Recent Labs  16/10/96 1329 04/12/15 0835  NA 139 140  K 3.6 3.2*  CL 97* 106  CO2 21* 22  GLUCOSE 127* 91  BUN 30* 18  CREATININE 1.79* 1.09*  CALCIUM 10.2 8.1*   Liver Function Tests:  Recent Labs  04/11/15 1329  AST 30  ALT 18  ALKPHOS 69  BILITOT 1.0  PROT 9.6*  ALBUMIN 4.5    Recent Labs  04/11/15 1503  LIPASE 25   CBC:  Recent Labs  04/11/15 1329 04/12/15 0835  WBC 12.9* 4.4  NEUTROABS 11.6* 3.4  HGB 14.2 9.5*  HCT 43.0 30.2*  MCV 81.0 82.7  PLT 237 172   Cardiac Enzymes:  Recent Labs  04/11/15 1718  TROPONINI 0.04*   Urinalysis:  Recent Labs  04/11/15 1756  COLORURINE YELLOW  LABSPEC 1.030  PHURINE 5.0  GLUCOSEU NEGATIVE  HGBUR TRACE*  BILIRUBINUR MODERATE*  KETONESUR 15*  PROTEINUR 100*  NITRITE NEGATIVE  LEUKOCYTESUR LARGE*    Signed: Denton Brick, MD 04/13/2015, 1:05 PM    Services Ordered on Discharge: none Equipment Ordered on Discharge: none

## 2015-04-13 NOTE — Progress Notes (Signed)
Subjective: Lying in bed sleeping this AM. No complaints. No vomiting for past 2 days. Says she is ready to go home.   Objective: Vital signs in last 24 hours: Filed Vitals:   04/12/15 0757 04/12/15 1547 04/12/15 2128 04/13/15 0454  BP: 108/69 105/72 107/74 102/65  Pulse: 85 96 109 83  Temp: 98 F (36.7 C) 98.3 F (36.8 C) 98.1 F (36.7 C) 97.9 F (36.6 C)  TempSrc: Oral Oral    Resp: Height:      Weight:      SpO2: 94% 95% 91% 97%   Weight change:   Intake/Output Summary (Last 24 hours) at 04/13/15 9604 Last data filed at 04/13/15 0600  Gross per 24 hour  Intake   1080 ml  Output    700 ml  Net    380 ml    Physical Exam General Appearance:    Sleepy due to early morning time, arousable, cooperative, no distress  Head:    Normocephalic, without obvious abnormality, atraumatic  Eyes:    conjunctiva/corneas clear, EOM's intact  Nose:   Nares normal,no rhinorrhe  Throat:   Lips a bit dry. Mucosa moist. Patient does not like to show teeth/gums. Severe tooth disease.  Lungs:     Clear to auscultation bilaterally, respirations unlabored  Chest Wall:    No tenderness or deformity   Heart:    Regular rate and rhythm, S1 and S2 normal, no murmur, rub   or gallop  Abdomen:     Soft, non-tender, no masses, no organomegaly  Extremities:   Extremities normal, atraumatic, no cyanosis or edema. Rash on arm has improved and is nearly non visible.  Skin:   Skin color, texture, turgor normal, no rashes or lesions  Neurologic:   Alert and oriented, normal sensation throughout   Lab Results: Basic Metabolic Panel:  Recent Labs  54/09/81 1329 04/12/15 0835  NA 139 140  K 3.6 3.2*  CL 97* 106  CO2 21* 22  GLUCOSE 127* 91  BUN 30* 18  CREATININE 1.79* 1.09*  CALCIUM 10.2 8.1*   Liver Function Tests:  Recent Labs  04/11/15 1329  AST 30  ALT 18  ALKPHOS 69  BILITOT 1.0  PROT 9.6*  ALBUMIN 4.5    Recent Labs  04/11/15 1503  LIPASE 25   No results  for input(s): AMMONIA in the last 72 hours. CBC:  Recent Labs  04/11/15 1329 04/12/15 0835  WBC 12.9* 4.4  NEUTROABS 11.6* 3.4  HGB 14.2 9.5*  HCT 43.0 30.2*  MCV 81.0 82.7  PLT 237 172   Cardiac Enzymes:  Recent Labs  04/11/15 1718  TROPONINI 0.04*   BNP: No results for input(s): PROBNP in the last 72 hours. D-Dimer: No results for input(s): DDIMER in the last 72 hours. CBG: No results for input(s): GLUCAP in the last 72 hours. Hemoglobin A1C: No results for input(s): HGBA1C in the last 72 hours. Fasting Lipid Panel: No results for input(s): CHOL, HDL, LDLCALC, TRIG, CHOLHDL, LDLDIRECT in the last 72 hours. Thyroid Function Tests: No results for input(s): TSH, T4TOTAL, FREET4, T3FREE, THYROIDAB in the last 72 hours. Anemia Panel: No results for input(s): VITAMINB12, FOLATE, FERRITIN, TIBC, IRON, RETICCTPCT in the last 72 hours. Coagulation: No results for input(s): LABPROT, INR in the last 72 hours. Urine Drug Screen: Drugs of Abuse  No results found for: LABOPIA, COCAINSCRNUR, LABBENZ, AMPHETMU, THCU, LABBARB  Alcohol Level: No results for input(s): ETH in the last 72 hours.  Urinalysis:  Recent Labs  04/11/15 1756  COLORURINE YELLOW  LABSPEC 1.030  PHURINE 5.0  GLUCOSEU NEGATIVE  HGBUR TRACE*  BILIRUBINUR MODERATE*  KETONESUR 15*  PROTEINUR 100*  NITRITE NEGATIVE  LEUKOCYTESUR LARGE*     Micro Results: Recent Results (from the past 240 hour(s))  Culture, blood (routine x 2)     Status: None (Preliminary result)   Collection Time: 04/11/15  1:30 PM  Result Value Ref Range Status   Specimen Description BLOOD RIGHT ANTECUBITAL  Final   Special Requests BOTTLES DRAWN AEROBIC AND ANAEROBIC 5CC  Final   Culture NO GROWTH < 24 HOURS  Final   Report Status PENDING  Incomplete  Culture, blood (routine x 2)     Status: None (Preliminary result)   Collection Time: 04/11/15  2:44 PM  Result Value Ref Range Status   Specimen Description BLOOD CENTRAL LINE   Final   Special Requests IN PEDIATRIC BOTTLE 4CC  Final   Culture NO GROWTH < 24 HOURS  Final   Report Status PENDING  Incomplete  Urine culture     Status: None (Preliminary result)   Collection Time: 04/11/15  5:57 PM  Result Value Ref Range Status   Specimen Description URINE, RANDOM  Final   Special Requests NONE  Final   Culture TOO YOUNG TO READ  Final   Report Status PENDING  Incomplete  C difficile quick scan w PCR reflex     Status: Abnormal   Collection Time: 04/12/15  6:12 AM  Result Value Ref Range Status   C Diff antigen POSITIVE (A) NEGATIVE Final   C Diff toxin POSITIVE (A) NEGATIVE Final   C Diff interpretation Positive for toxigenic C. difficile  Final    Comment: CRITICAL RESULT CALLED TO, READ BACK BY AND VERIFIED WITH: R. MARAMBAS RN 04/12/15 AT 0936 BY A. DAVIS   Gastrointestinal Panel by PCR , Stool     Status: None   Collection Time: 04/12/15  6:12 AM  Result Value Ref Range Status   Campylobacter species NOT DETECTED NOT DETECTED Final   Plesimonas shigelloides NOT DETECTED NOT DETECTED Final   Salmonella species NOT DETECTED NOT DETECTED Final   Yersinia enterocolitica NOT DETECTED NOT DETECTED Final   Vibrio species NOT DETECTED NOT DETECTED Final   Vibrio cholerae NOT DETECTED NOT DETECTED Final   Enteroaggregative E coli (EAEC) NOT DETECTED NOT DETECTED Final   Enteropathogenic E coli (EPEC) NOT DETECTED NOT DETECTED Final   Enterotoxigenic E coli (ETEC) NOT DETECTED NOT DETECTED Final   Shiga like toxin producing E coli (STEC) NOT DETECTED NOT DETECTED Final   E. coli O157 NOT DETECTED NOT DETECTED Final   Shigella/Enteroinvasive E coli (EIEC) NOT DETECTED NOT DETECTED Final   Cryptosporidium NOT DETECTED NOT DETECTED Final   Cyclospora cayetanensis NOT DETECTED NOT DETECTED Final   Entamoeba histolytica NOT DETECTED NOT DETECTED Final   Giardia lamblia NOT DETECTED NOT DETECTED Final   Adenovirus F40/41 NOT DETECTED NOT DETECTED Final   Astrovirus  NOT DETECTED NOT DETECTED Final   Norovirus GI/GII NOT DETECTED NOT DETECTED Final   Rotavirus A NOT DETECTED NOT DETECTED Final   Sapovirus (I, II, IV, and V) NOT DETECTED NOT DETECTED Final   Studies/Results: Dg Chest 2 View  04/11/2015  CLINICAL DATA:  Cough, congestion, fever, HIV-positive, elevated CD4 counts EXAM: CHEST  2 VIEW COMPARISON:  03/15/2015 FINDINGS: Cardiomediastinal silhouette is stable. No pulmonary edema. Persistent streaky airspace disease in left base. Residual mild patchy airspace is  in right lower lobe with slight improvement from prior exam. IMPRESSION: No pulmonary edema. Persistent streaky airspace disease in left base. Residual mild patchy airspace is in right lower lobe with slight improvement from prior exam. Findings suspicious for residual or recurrent pneumonia. Electronically Signed   By: Natasha Mead M.D.   On: 04/11/2015 13:49   Medications:  . darunavir-cobicistat  1 tablet Oral Daily  . dolutegravir  50 mg Oral BID  . feeding supplement (ENSURE ENLIVE)  237 mL Oral BID BM  . heparin  5,000 Units Subcutaneous 3 times per day  . lamiVUDine-zidovudine  1 tablet Oral BID  . metronidazole  500 mg Intravenous Q8H  . oseltamivir  75 mg Oral BID  . pregabalin  75 mg Oral BID   acetaminophen **OR** acetaminophen, albuterol, ondansetron  Assessment/Plan: Principal Problem:   C. difficile colitis Active Problems:   AIDS (acquired immune deficiency syndrome) (HCC)   HCAP (healthcare-associated pneumonia)   AKI (acute kidney injury) (HCC)   Infection due to trichomonas   Influenza A H1N1 infection   Sepsis (HCC)  ##C. Diff vs H1N1 Enterocolitis with associated dehydration and AKI, resolved The patient presented with nausea, vomiting, and profuse watery diarrhea x 1 day. Her boyfriend experienced similar symptoms a few days ago which have since resolved. At presentation the patient was clearly dehydrated with sunken eyes, dry mucus membranes, and tachycardia,  and lactic acid 2.97. She had recently finished a course of cefuroxime for treatment of HCAP. She was found to be H1N1+ and C Diff antigen and toxin +. Her creatine at presentation was 1.8, she received IVF and today it has trended down to 1.1. Her lactic acid has normalized (it is now 0.7). GI pathogen panel is negative for all species tested. Treatments - DC IVF (completed course of ~36 hours) - Continue Zofran  q8h prn (has not needed for past 1-2 days) - Continue Tylenol 650 mg q6h prn for pain (has not needed for past 1-2 days) - Continue metronidazole 500 mg TID for treatment of C Diff and Trichomonas vaginalis - Continue Tamiflu BID for treatment of H1N1 flu  ##Hypokalemia Yesterday 04/12/15 the patient's K+ was 3.2. She received 80 mEq of potassium. Today 04/13/2015 the patient is refusing labs stating that she is a hard stick. We will defer rechecking potassium at this time, as the amount of K+ she received likely places here in the ~4 range. - Consider giving 2g Magnesium  ##Trichomonas Vaginalis infection The patient received a UA in the ED and was incidentally found to be trichomonas +. She denies symptoms of pruritus, dysuria, or vaginal discharge.  - Patient counseled with regards to sexual transmission - Patient advised to inform her boyfriend - metronidazole 500 mg TID as above  ##HIV positive status The patient was diagnosed with HIV in ~2010 during routine screening for her first pregnancy. Labs in clinic 04/11/2015 were reportedly CD4 count 174 and viral load 110. She reports that she takes her HIV medications every day and does not skip doses or have trouble filling her medications - Today 04/13/2015 her CD4 count is 130 in our [facility] system - Echo 04/12/2015 was normal, EF 55-60%, no signs of vegetations - Continue home HIV medications (prezcobix, dolutegravir, combivir)  ##Shortness of breath, resolved The patient presents with SOB x3 days. She was recently treated at  [facility] ~1 month ago (discharged 03/18/15) with cefepime for HCAP. During that admission, sputum test for PCP was negative. After discharge, she felt her  shortness of breath temporarily resolved but recently returned. CXR at presentation showed small RLL patchy infiltrate. She was initially started on cefepime, but her oxygen saturation has been >95% on room air since admission and she has normal work of breathing. Her shortness of breath has resolved. DC cefepime today. Treatments - Continue home albuterol 1 puff q6h prn  Chest Discomfort, resolved At presentation the patient endorsed constant chest discomfort 'like someone is sitting on my chest' and also had shortness of breath. She denied chest pain. EKG was normal, troponin negative. - Continue to monitor  Shingles The patient has a history of shingles on her back. She also has a rash on her arm which has been present for the past 1 month and is not pruritic or painful. - Continue home lyrica 75 mg  This is a Psychologist, occupationalMedical Student Note.  The care of the patient was discussed with Dr. Danella Pentonruong and the assessment and plan formulated with their assistance.  Please see their attached note for official documentation of the daily encounter.   LOS: 2 days   Rosebud PolesPaola Xoie Kreuser, Med Student 04/13/2015, 9:21 AM

## 2015-04-16 ENCOUNTER — Other Ambulatory Visit: Payer: Self-pay | Admitting: *Deleted

## 2015-04-16 ENCOUNTER — Telehealth: Payer: Self-pay | Admitting: Infectious Disease

## 2015-04-16 LAB — CULTURE, BLOOD (ROUTINE X 2)
CULTURE: NO GROWTH
Culture: NO GROWTH

## 2015-04-16 MED ORDER — COMBIVIR 150-300 MG PO TABS: 1.0000 | ORAL_TABLET | Freq: Two times a day (BID) | ORAL | Status: DC

## 2015-04-16 MED ORDER — PREZCOBIX 800-150 MG PO TABS: 1.0000 | ORAL_TABLET | Freq: Every day | ORAL | Status: DC

## 2015-04-16 MED ORDER — TIVICAY 50 MG PO TABS: 50.0000 mg | ORAL_TABLET | Freq: Two times a day (BID) | ORAL | Status: DC

## 2015-04-16 NOTE — Telephone Encounter (Signed)
I received a passage from that she stating that this patient needs a refill on her Combivir. She is transitioning from [place] to be seen here at [facility]. I [place] to the pharmacy in [place] but can someone check if this is the correct one for her?

## 2015-04-16 NOTE — Telephone Encounter (Signed)
Excellent

## 2015-04-16 NOTE — Telephone Encounter (Signed)
Patient using [facility] ([facility]) for delivery of HIV meds.  RN sent new scripts there. They have been in contact with the patient and her mother, will send it out for delivery. RN confirmed upcoming appointment for 3/21.  Mother states that Frances Ball will be transporting them to [facility]. Andree CossHowell, Janeann Paisley M, RN

## 2015-04-22 ENCOUNTER — Ambulatory Visit: Payer: Medicaid Other | Admitting: Internal Medicine

## 2015-04-23 ENCOUNTER — Encounter: Payer: Self-pay | Admitting: Infectious Disease

## 2015-04-23 ENCOUNTER — Encounter: Payer: Self-pay | Admitting: *Deleted

## 2015-04-23 ENCOUNTER — Ambulatory Visit (INDEPENDENT_AMBULATORY_CARE_PROVIDER_SITE_OTHER): Payer: Medicaid Other | Admitting: Infectious Disease

## 2015-04-23 VITALS — BP 103/69 | HR 91 | Temp 98.1°F | Ht 62.0 in | Wt 119.0 lb

## 2015-04-23 DIAGNOSIS — Z8619 Personal history of other infectious and parasitic diseases: Secondary | ICD-10-CM | POA: Insufficient documentation

## 2015-04-23 DIAGNOSIS — B20 Human immunodeficiency virus [HIV] disease: Secondary | ICD-10-CM | POA: Diagnosis not present

## 2015-04-23 DIAGNOSIS — A5901 Trichomonal vulvovaginitis: Secondary | ICD-10-CM | POA: Diagnosis not present

## 2015-04-23 DIAGNOSIS — F1990 Other psychoactive substance use, unspecified, uncomplicated: Secondary | ICD-10-CM

## 2015-04-23 DIAGNOSIS — F1111 Opioid abuse, in remission: Secondary | ICD-10-CM

## 2015-04-23 DIAGNOSIS — A599 Trichomoniasis, unspecified: Secondary | ICD-10-CM

## 2015-04-23 DIAGNOSIS — B182 Chronic viral hepatitis C: Secondary | ICD-10-CM | POA: Diagnosis not present

## 2015-04-23 DIAGNOSIS — A047 Enterocolitis due to Clostridium difficile: Secondary | ICD-10-CM | POA: Diagnosis not present

## 2015-04-23 DIAGNOSIS — A0472 Enterocolitis due to Clostridium difficile, not specified as recurrent: Secondary | ICD-10-CM

## 2015-04-23 DIAGNOSIS — J101 Influenza due to other identified influenza virus with other respiratory manifestations: Secondary | ICD-10-CM | POA: Diagnosis not present

## 2015-04-23 DIAGNOSIS — F1121 Opioid dependence, in remission: Secondary | ICD-10-CM | POA: Insufficient documentation

## 2015-04-23 HISTORY — DX: Chronic viral hepatitis C: B18.2
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
## 2015-04-24 LAB — CBC WITH DIFFERENTIAL/PLATELET
BASOS PCT: 0 % (ref 0–1)
Basophils Absolute: 0 10*3/uL (ref 0.0–0.1)
Eosinophils Absolute: 0 10*3/uL (ref 0.0–0.7)
Eosinophils Relative: 0 % (ref 0–5)
HCT: 29.5 % — ABNORMAL LOW (ref 36.0–46.0)
HEMOGLOBIN: 9.3 g/dL — AB (ref 12.0–15.0)
Lymphocytes Relative: 28 % (ref 12–46)
Lymphs Abs: 0.7 10*3/uL (ref 0.7–4.0)
MCH: 26.9 pg (ref 26.0–34.0)
MCHC: 31.5 g/dL (ref 30.0–36.0)
MCV: 85.3 fL (ref 78.0–100.0)
MPV: 9.4 fL (ref 8.6–12.4)
Monocytes Absolute: 0.2 10*3/uL (ref 0.1–1.0)
Monocytes Relative: 8 % (ref 3–12)
NEUTROS ABS: 1.6 10*3/uL — AB (ref 1.7–7.7)
NEUTROS PCT: 64 % (ref 43–77)
Platelets: 288 10*3/uL (ref 150–400)
RBC: 3.46 MIL/uL — AB (ref 3.87–5.11)
RDW: 25.6 % — ABNORMAL HIGH (ref 11.5–15.5)
WBC: 2.5 10*3/uL — AB (ref 4.0–10.5)

## 2015-04-24 LAB — COMPLETE METABOLIC PANEL WITH GFR
ALBUMIN: 3.8 g/dL (ref 3.6–5.1)
ALK PHOS: 45 U/L (ref 33–115)
ALT: 18 U/L (ref 6–29)
AST: 25 U/L (ref 10–30)
BUN: 10 mg/dL (ref 7–25)
CALCIUM: 8.6 mg/dL (ref 8.6–10.2)
CO2: 24 mmol/L (ref 20–31)
Chloride: 102 mmol/L (ref 98–110)
Creat: 0.81 mg/dL (ref 0.50–1.10)
GFR, Est African American: >89 mL/min (ref >=60)
Glucose, Bld: 84 mg/dL (ref 65–99)
POTASSIUM: 4.1 mmol/L (ref 3.5–5.3)
Sodium: 139 mmol/L (ref 135–146)
Total Bilirubin: 0.4 mg/dL (ref 0.2–1.2)
Total Protein: 6.5 g/dL (ref 6.1–8.1)

## 2015-04-24 LAB — RPR

## 2015-04-25 LAB — T-HELPER CELL (CD4) - (RCID CLINIC ONLY)
CD4 % Helper T Cell: 26 % — ABNORMAL LOW (ref 33–55)
CD4 T Cell Abs: 180 /uL — ABNORMAL LOW (ref 400–2700)

## 2015-04-26 LAB — HIV RNA, RTPCR W/R GT (RTI, PI,INT)
HIV 1 RNA QUANT: 103 {copies}/mL — AB
HIV-1 RNA Quant, Log: 2.01 Log copies/mL — ABNORMAL HIGH

## 2015-05-02 LAB — HLA B*5701: HLA-B*5701 w/rflx HLA-B High: NEGATIVE

## 2015-05-20 ENCOUNTER — Telehealth: Payer: Self-pay | Admitting: Infectious Disease

## 2015-05-20 NOTE — Telephone Encounter (Signed)
Interesting patient. Her GENOSURE ARCHIVE shows NO resistance at all.  She told me that at [facility] she was told that she had R to "part of Truvada presumably a 184V mutation? 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         Seems we should be able to believe most of it though and at least drop down to once daily Tivicay with Desovy and Prezcobix and she would still be AMPLY covered!

## 2015-05-28 ENCOUNTER — Encounter: Payer: Self-pay | Admitting: Infectious Disease

## 2015-05-28 ENCOUNTER — Ambulatory Visit (INDEPENDENT_AMBULATORY_CARE_PROVIDER_SITE_OTHER): Payer: Medicaid Other | Admitting: Infectious Disease

## 2015-05-28 VITALS — BP 93/64 | HR 96 | Temp 97.9°F | Wt 118.0 lb

## 2015-05-28 DIAGNOSIS — F1990 Other psychoactive substance use, unspecified, uncomplicated: Secondary | ICD-10-CM

## 2015-05-28 DIAGNOSIS — B182 Chronic viral hepatitis C: Secondary | ICD-10-CM | POA: Diagnosis not present

## 2015-05-28 DIAGNOSIS — G909 Disorder of the autonomic nervous system, unspecified: Secondary | ICD-10-CM

## 2015-05-28 DIAGNOSIS — B20 Human immunodeficiency virus [HIV] disease: Secondary | ICD-10-CM

## 2015-05-28 MED ORDER — PREGABALIN 75 MG PO CAPS: 75.0000 mg | ORAL_CAPSULE | Freq: Two times a day (BID) | ORAL | Status: DC

## 2015-05-28 MED ORDER — DAPSONE 100 MG PO TABS: 100.0000 mg | ORAL_TABLET | Freq: Every day | ORAL | Status: DC

## 2015-05-28 MED ORDER — PREZCOBIX 800-150 MG PO TABS: 1.0000 | ORAL_TABLET | Freq: Every day | ORAL | Status: DC

## 2015-05-28 MED ORDER — EMTRICITABINE-TENOFOVIR AF 200-25 MG PO TABS: 1.0000 | ORAL_TABLET | Freq: Every day | ORAL | Status: DC

## 2015-05-28 MED ORDER — TIVICAY 50 MG PO TABS: 50.0000 mg | ORAL_TABLET | Freq: Every day | ORAL | Status: DC

## 2015-05-28 MED FILL — DAPSONE 100 MG TABLET: 100 | 30 days supply | Qty: 30 | Fill #0

## 2015-05-28 MED FILL — DESCOVY 200-25 MG TABS: 200-25 | 30 days supply | Qty: 30 | Fill #0
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
## 2015-06-03 ENCOUNTER — Encounter: Payer: Self-pay | Admitting: Infectious Disease

## 2015-06-07 ENCOUNTER — Encounter: Payer: Self-pay | Admitting: Infectious Disease

## 2015-06-10 ENCOUNTER — Other Ambulatory Visit: Payer: Medicaid Other

## 2015-06-12 ENCOUNTER — Telehealth: Payer: Self-pay | Admitting: *Deleted

## 2015-06-12 NOTE — Telephone Encounter (Signed)
Frances SchillerMitch is also trying to get in touch with the patient she is having trouble tolerating her new reigmen  We were proposing either  TRIUMEQ + Viread (can crush them since they are big pills) with or without food  Or  ODEFSEY and Tivicay WITH FOOD and no PPI, H2 blockers.   She [facility]  Does she havea  Preference?

## 2015-06-12 NOTE — Telephone Encounter (Signed)
PA needed fro Lyrica rx - [facility]

## 2015-06-26 MED FILL — DESCOVY 200-25 MG TABS: 200-25 | 30 days supply | Qty: 30 | Fill #1

## 2015-07-03 ENCOUNTER — Telehealth: Payer: Self-pay | Admitting: *Deleted

## 2015-07-03 ENCOUNTER — Other Ambulatory Visit: Payer: Self-pay | Admitting: *Deleted

## 2015-07-03 DIAGNOSIS — B370 Candidal stomatitis: Secondary | ICD-10-CM

## 2015-07-03 DIAGNOSIS — B3781 Candidal esophagitis: Principal | ICD-10-CM

## 2015-07-03 MED ORDER — FLUCONAZOLE 100 MG PO TABS: 100.0000 mg | ORAL_TABLET | Freq: Every day | ORAL | Status: DC

## 2015-07-03 NOTE — Telephone Encounter (Signed)
Rx sent.  Patient will follow up as scheduled 6/5.  She asked if she should still continue the augmentin that was prescribed by the urgent care. Per mother, although they did not test her for strep, she had a fever of 103 and was given the antibiotic at the urgent care.  RN advised patient to continue as prescribed by that physician.

## 2015-07-03 NOTE — Telephone Encounter (Signed)
I would give her a 2 week course of fluconazole 100mg  a day

## 2015-07-03 NOTE — Telephone Encounter (Signed)
Perfect

## 2015-07-03 NOTE — Telephone Encounter (Signed)
Mitch called Triage for patient. She is having difficulty swallowing and talking due to mouth pain.  Patient recently back on OLD REGIMEN - taking tivicay (twice daily), combivir (twice daily), prezcobix (daily)  Patient states she had a subjective fever over the weekend (went to urgent care locally, was told it was strep due to blisters/patches in her mouth). Per Mitch, no swab done. Was given magic mouthwash, no diflucan or fluconazole. Per mother, was given augmentin for presumed strep.  Please advise. Patient without transportation, unable to come to clinic.  If needs to pick up medication, prefers [facility] or [facility] in [place]. Andree CossHowell, Bertram Haddix M, RN    Mom Leonides Grillshonda Allcock [phone]

## 2015-07-04 MED FILL — PREZCOBIX 800 MG-150 MG TAB: 800-150 | 30 days supply | Qty: 30 | Fill #0

## 2015-07-04 MED FILL — TIVICAY 50 MG TABLET: 50 | 30 days supply | Qty: 30 | Fill #0

## 2015-07-08 ENCOUNTER — Other Ambulatory Visit: Payer: Self-pay | Admitting: Pharmacist Clinician (PhC)/ Clinical Pharmacy Specialist

## 2015-07-08 ENCOUNTER — Ambulatory Visit: Payer: Medicaid Other | Admitting: Infectious Disease

## 2015-07-08 MED ORDER — LAMIVUDINE-ZIDOVUDINE 150-300 MG PO TABS: 1.0000 | ORAL_TABLET | Freq: Two times a day (BID) | ORAL | Status: DC

## 2015-07-08 MED FILL — LAMIVUDINE-ZIDOVUDINE TAB: 150-300 | 30 days supply | Qty: 60 | Fill #0

## 2015-07-15 ENCOUNTER — Ambulatory Visit: Payer: Medicaid Other | Admitting: Infectious Disease

## 2015-07-19 NOTE — Telephone Encounter (Signed)
[facility] to [facility], not approved yet,  Will refax to Medicaid today..Marland Kitchen

## 2015-07-29 ENCOUNTER — Other Ambulatory Visit: Payer: Medicaid Other

## 2015-08-01 ENCOUNTER — Other Ambulatory Visit: Payer: Medicaid Other

## 2015-08-01 DIAGNOSIS — B20 Human immunodeficiency virus [HIV] disease: Secondary | ICD-10-CM

## 2015-08-01 LAB — CBC WITH DIFFERENTIAL/PLATELET
BASOS PCT: 0 %
Basophils Absolute: 0 cells/uL (ref 0–200)
EOS ABS: 0 {cells}/uL — AB (ref 15–500)
Eosinophils Relative: 0 %
HEMATOCRIT: 33.8 % — AB (ref 35.0–45.0)
HEMOGLOBIN: 10.6 g/dL — AB (ref 11.7–15.5)
LYMPHS ABS: 912 {cells}/uL (ref 850–3900)
LYMPHS PCT: 38 %
MCH: 25.4 pg — ABNORMAL LOW (ref 27.0–33.0)
MCHC: 31.4 g/dL — ABNORMAL LOW (ref 32.0–36.0)
MCV: 81.1 fL (ref 80.0–100.0)
MONO ABS: 120 {cells}/uL — AB (ref 200–950)
Monocytes Relative: 5 %
NEUTROS ABS: 1368 {cells}/uL — AB (ref 1500–7800)
Neutrophils Relative %: 57 %
Platelets: 144 10*3/uL (ref 140–400)
RBC: 4.17 MIL/uL (ref 3.80–5.10)
RDW: 20.7 % — ABNORMAL HIGH (ref 11.0–15.0)
WBC: 2.4 10*3/uL — ABNORMAL LOW (ref 3.8–10.8)

## 2015-08-01 LAB — COMPLETE METABOLIC PANEL WITH GFR
ALBUMIN: 4 g/dL (ref 3.6–5.1)
ALT: 19 U/L (ref 6–29)
AST: 33 U/L — ABNORMAL HIGH (ref 10–30)
Alkaline Phosphatase: 61 U/L (ref 33–115)
BILIRUBIN TOTAL: 0.5 mg/dL (ref 0.2–1.2)
BUN: 11 mg/dL (ref 7–25)
CALCIUM: 8.5 mg/dL — AB (ref 8.6–10.2)
CO2: 24 mmol/L (ref 20–31)
CREATININE: 0.84 mg/dL (ref 0.50–1.10)
Chloride: 104 mmol/L (ref 98–110)
GFR, Est Non African American: >89 mL/min (ref >=60)
Glucose, Bld: 86 mg/dL (ref 65–99)
Potassium: 3.9 mmol/L (ref 3.5–5.3)
Sodium: 141 mmol/L (ref 135–146)
TOTAL PROTEIN: 7 g/dL (ref 6.1–8.1)

## 2015-08-02 LAB — HIV-1 RNA ULTRAQUANT REFLEX TO GENTYP+
HIV 1 RNA Quant: 78710 copies/mL — ABNORMAL HIGH (ref <20)
HIV-1 RNA QUANT, LOG: 4.9 {Log_copies}/mL — AB (ref <1.30)

## 2015-08-02 LAB — T-HELPER CELL (CD4) - (RCID CLINIC ONLY)
CD4 % Helper T Cell: 15 % — ABNORMAL LOW (ref 33–55)
CD4 T CELL ABS: 150 /uL — AB (ref 400–2700)

## 2015-08-12 ENCOUNTER — Ambulatory Visit: Payer: Medicaid Other | Admitting: Infectious Disease

## 2015-08-14 LAB — HIV-1 GENOTYPR PLUS

## 2015-08-23 MED FILL — DESCOVY 200-25 MG TABS: 200-25 | 30 days supply | Qty: 30 | Fill #2

## 2015-08-30 MED FILL — TIVICAY 50 MG TABLET: 50 | 30 days supply | Qty: 30 | Fill #1

## 2015-09-23 MED FILL — DESCOVY 200-25 MG TABS: 200-25 | 30 days supply | Qty: 30 | Fill #3

## 2015-09-30 MED FILL — TIVICAY 50 MG TABLET: 50 | 30 days supply | Qty: 30 | Fill #2

## 2015-10-17 ENCOUNTER — Other Ambulatory Visit: Payer: Self-pay | Admitting: Infectious Disease

## 2015-10-29 ENCOUNTER — Other Ambulatory Visit: Payer: Self-pay | Admitting: Infectious Disease

## 2016-01-15 ENCOUNTER — Telehealth: Payer: Self-pay | Admitting: *Deleted

## 2016-01-15 NOTE — Telephone Encounter (Signed)
Patient has Transferred Care to [facility].  Requested records be faxed [place] satellite [place].  Records faxed per request.  [facility] notified.

## 2016-01-16 ENCOUNTER — Other Ambulatory Visit: Payer: Self-pay | Admitting: Infectious Disease

## 2016-01-20 NOTE — Telephone Encounter (Signed)
[facility] given [facility] Fax number to send refill request.  Patient has transferred care to that location.  Records have been faxed.

## 2016-04-14 ENCOUNTER — Other Ambulatory Visit: Payer: Self-pay | Admitting: Infectious Disease

## 2016-07-28 DIAGNOSIS — B20 Human immunodeficiency virus [HIV] disease: Secondary | ICD-10-CM

## 2016-07-28 DIAGNOSIS — N942 Vaginismus: Secondary | ICD-10-CM | POA: Insufficient documentation

## 2016-10-27 ENCOUNTER — Encounter (HOSPITAL_COMMUNITY): Payer: Self-pay | Admitting: Emergency Medicine

## 2016-10-27 ENCOUNTER — Inpatient Hospital Stay (HOSPITAL_COMMUNITY)
Admission: EM | Admit: 2016-10-27 | Discharge: 2016-10-27 | DRG: 975 | Discharge status: Against Medical Advice | Payer: Medicaid Other | Attending: Emergency Medicine | Admitting: Emergency Medicine

## 2016-10-27 ENCOUNTER — Emergency Department (HOSPITAL_COMMUNITY): Payer: Medicaid Other

## 2016-10-27 DIAGNOSIS — B1920 Unspecified viral hepatitis C without hepatic coma: Secondary | ICD-10-CM | POA: Diagnosis present

## 2016-10-27 DIAGNOSIS — R0602 Shortness of breath: Secondary | ICD-10-CM | POA: Diagnosis present

## 2016-10-27 DIAGNOSIS — J189 Pneumonia, unspecified organism: Secondary | ICD-10-CM | POA: Diagnosis present

## 2016-10-27 DIAGNOSIS — F4310 Post-traumatic stress disorder, unspecified: Secondary | ICD-10-CM | POA: Diagnosis present

## 2016-10-27 DIAGNOSIS — E876 Hypokalemia: Secondary | ICD-10-CM | POA: Diagnosis present

## 2016-10-27 DIAGNOSIS — A419 Sepsis, unspecified organism: Secondary | ICD-10-CM | POA: Diagnosis present

## 2016-10-27 DIAGNOSIS — Z79899 Other long term (current) drug therapy: Secondary | ICD-10-CM

## 2016-10-27 DIAGNOSIS — Z21 Asymptomatic human immunodeficiency virus [HIV] infection status: Secondary | ICD-10-CM | POA: Diagnosis present

## 2016-10-27 DIAGNOSIS — B20 Human immunodeficiency virus [HIV] disease: Secondary | ICD-10-CM | POA: Diagnosis present

## 2016-10-27 DIAGNOSIS — E871 Hypo-osmolality and hyponatremia: Secondary | ICD-10-CM | POA: Diagnosis present

## 2016-10-27 DIAGNOSIS — J181 Lobar pneumonia, unspecified organism: Secondary | ICD-10-CM

## 2016-10-27 LAB — CBC WITH DIFFERENTIAL/PLATELET
BASOS ABS: 0 10*3/uL (ref 0.0–0.1)
BASOS PCT: 0 %
EOS ABS: 0 10*3/uL (ref 0.0–0.7)
EOS PCT: 0 %
HEMATOCRIT: 38.5 % (ref 36.0–46.0)
Hemoglobin: 12.5 g/dL (ref 12.0–15.0)
Lymphocytes Relative: 11 %
Lymphs Abs: 1.1 10*3/uL (ref 0.7–4.0)
MCH: 23.9 pg — ABNORMAL LOW (ref 26.0–34.0)
MCHC: 32.5 g/dL (ref 30.0–36.0)
MCV: 73.6 fL — ABNORMAL LOW (ref 78.0–100.0)
MONOS PCT: 7 %
Monocytes Absolute: 0.7 10*3/uL (ref 0.1–1.0)
NEUTROS PCT: 83 %
Neutro Abs: 8.4 10*3/uL — ABNORMAL HIGH (ref 1.7–7.7)
PLATELETS: 246 10*3/uL (ref 150–400)
RBC: 5.23 MIL/uL — ABNORMAL HIGH (ref 3.87–5.11)
RDW: 19.6 % — AB (ref 11.5–15.5)
WBC: 10.2 10*3/uL (ref 4.0–10.5)

## 2016-10-27 LAB — COMPREHENSIVE METABOLIC PANEL
ALK PHOS: 88 U/L (ref 38–126)
ALT: 12 U/L — ABNORMAL LOW (ref 14–54)
ANION GAP: 16 — AB (ref 5–15)
AST: 24 U/L (ref 15–41)
Albumin: 3.6 g/dL (ref 3.5–5.0)
BILIRUBIN TOTAL: 1 mg/dL (ref 0.3–1.2)
BUN: 9 mg/dL (ref 6–20)
CALCIUM: 8.7 mg/dL — AB (ref 8.9–10.3)
CO2: 25 mmol/L (ref 22–32)
Chloride: 87 mmol/L — ABNORMAL LOW (ref 101–111)
Creatinine, Ser: 1.04 mg/dL — ABNORMAL HIGH (ref 0.44–1.00)
GFR calc non Af Amer: >60 mL/min (ref >60)
Glucose, Bld: 91 mg/dL (ref 65–99)
Potassium: 2.9 mmol/L — ABNORMAL LOW (ref 3.5–5.1)
Sodium: 128 mmol/L — ABNORMAL LOW (ref 135–145)
TOTAL PROTEIN: 8.2 g/dL — AB (ref 6.5–8.1)

## 2016-10-27 LAB — PROTIME-INR
INR: 1.27
PROTHROMBIN TIME: 15.7 s — AB (ref 11.4–15.2)

## 2016-10-27 LAB — I-STAT CG4 LACTIC ACID, ED: Lactic Acid, Venous: 1.43 mmol/L (ref 0.5–1.9)

## 2016-10-27 MED ORDER — PREGABALIN 25 MG PO CAPS: 75.0000 mg | ORAL_CAPSULE | Freq: Two times a day (BID) | ORAL | Status: DC

## 2016-10-27 MED ORDER — DEXTROSE 5 % IV SOLN: 1.0000 g | INTRAVENOUS | Status: DC

## 2016-10-27 MED ORDER — DOLUTEGRAVIR SODIUM 50 MG PO TABS: 50.0000 mg | ORAL_TABLET | Freq: Two times a day (BID) | ORAL | Status: DC

## 2016-10-27 MED ORDER — DOXYCYCLINE HYCLATE 100 MG PO TABS: 100.0000 mg | ORAL_TABLET | Freq: Two times a day (BID) | ORAL | Status: DC

## 2016-10-27 MED ORDER — DEXTROSE 5 % IV SOLN
1.0000 g | Freq: Once | INTRAVENOUS | Status: DC
  Filled 2016-10-27: qty 10

## 2016-10-27 MED ORDER — DAPSONE 100 MG PO TABS: 100.0000 mg | ORAL_TABLET | Freq: Every day | ORAL | Status: DC

## 2016-10-27 MED ORDER — DARUNAVIR-COBICISTAT 800-150 MG PO TABS: 1.0000 | ORAL_TABLET | Freq: Every day | ORAL | Status: DC

## 2016-10-27 MED ORDER — LAMIVUDINE-ZIDOVUDINE 150-300 MG PO TABS: 1.0000 | ORAL_TABLET | Freq: Two times a day (BID) | ORAL | Status: DC

## 2016-10-27 MED ORDER — ENOXAPARIN SODIUM 40 MG/0.4ML ~~LOC~~ SOLN: 40.0000 mg | SUBCUTANEOUS | Status: DC

## 2016-10-27 MED ORDER — MAGNESIUM SULFATE IN D5W 1-5 GM/100ML-% IV SOLN
1.0000 g | Freq: Once | INTRAVENOUS | Status: DC
  Filled 2016-10-27: qty 100

## 2016-10-27 MED ORDER — DOXYCYCLINE HYCLATE 100 MG PO CAPS: 100.0000 mg | ORAL_CAPSULE | Freq: Two times a day (BID) | ORAL | 0 refills | Status: AC

## 2016-10-27 MED ORDER — POTASSIUM CHLORIDE CRYS ER 20 MEQ PO TBCR
40.0000 meq | EXTENDED_RELEASE_TABLET | Freq: Once | ORAL | Status: AC
  Administered 2016-10-27: 40 meq via ORAL
  Filled 2016-10-27: qty 2

## 2016-10-27 MED ORDER — ALBUTEROL SULFATE (2.5 MG/3ML) 0.083% IN NEBU
2.5000 mg | INHALATION_SOLUTION | RESPIRATORY_TRACT | Status: DC | PRN
Start: 2016-10-27 — End: 2016-10-28

## 2016-10-27 MED ORDER — FLUCONAZOLE 100 MG PO TABS: 100.0000 mg | ORAL_TABLET | Freq: Every day | ORAL | Status: DC

## 2016-10-27 MED ORDER — POTASSIUM CHLORIDE IN NACL 40-0.9 MEQ/L-% IV SOLN: INTRAVENOUS | Status: DC

## 2016-10-27 MED ORDER — DOXYCYCLINE HYCLATE 100 MG PO TABS
100.0000 mg | ORAL_TABLET | Freq: Once | ORAL | Status: AC
  Administered 2016-10-27: 100 mg via ORAL
  Filled 2016-10-27: qty 1

## 2016-10-27 MED ORDER — ACETAMINOPHEN 325 MG PO TABS
650.0000 mg | ORAL_TABLET | Freq: Once | ORAL | Status: AC
  Administered 2016-10-27: 650 mg via ORAL
  Filled 2016-10-27: qty 2

## 2016-10-27 MED ORDER — LACTATED RINGERS IV BOLUS (SEPSIS): 1500.0000 mL | Freq: Once | INTRAVENOUS | Status: DC

## 2016-10-27 NOTE — ED Notes (Signed)
Patient refusing IV access. States she is going home. Spoke with MD Anitra Lauth regarding patient's intention. MD already aware and working on alternate plan of care.

## 2016-10-27 NOTE — ED Triage Notes (Signed)
Pt reports nasal congestion, cough, sinus pressure X2 days.
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
## 2016-11-01 LAB — CULTURE, BLOOD (ROUTINE X 2)
CULTURE: NO GROWTH
Culture: NO GROWTH
SPECIAL REQUESTS: ADEQUATE
Special Requests: ADEQUATE
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
## 2017-03-12 ENCOUNTER — Ambulatory Visit (INDEPENDENT_AMBULATORY_CARE_PROVIDER_SITE_OTHER): Payer: Medicaid Other | Admitting: Infectious Diseases

## 2017-03-12 ENCOUNTER — Other Ambulatory Visit: Payer: Self-pay

## 2017-03-12 ENCOUNTER — Other Ambulatory Visit: Payer: Self-pay | Admitting: Pharmacist

## 2017-03-12 ENCOUNTER — Encounter: Payer: Self-pay | Admitting: Infectious Diseases

## 2017-03-12 VITALS — BP 102/70 | HR 116 | Temp 98.0°F | Ht 63.0 in | Wt 107.0 lb

## 2017-03-12 DIAGNOSIS — Z87898 Personal history of other specified conditions: Secondary | ICD-10-CM

## 2017-03-12 DIAGNOSIS — B20 Human immunodeficiency virus [HIV] disease: Secondary | ICD-10-CM

## 2017-03-12 DIAGNOSIS — R197 Diarrhea, unspecified: Secondary | ICD-10-CM

## 2017-03-12 DIAGNOSIS — R05 Cough: Secondary | ICD-10-CM

## 2017-03-12 DIAGNOSIS — R509 Fever, unspecified: Secondary | ICD-10-CM

## 2017-03-12 DIAGNOSIS — B182 Chronic viral hepatitis C: Secondary | ICD-10-CM

## 2017-03-12 DIAGNOSIS — F1111 Opioid abuse, in remission: Secondary | ICD-10-CM

## 2017-03-12 DIAGNOSIS — R059 Cough, unspecified: Secondary | ICD-10-CM

## 2017-03-12 MED ORDER — DARUN-COBIC-EMTRICIT-TENOFAF 800-150-200-10 MG PO TABS: 1.0000 | ORAL_TABLET | Freq: Every day | ORAL | 5 refills | Status: DC

## 2017-03-12 MED ORDER — DAPSONE 100 MG PO TABS: 100.0000 mg | ORAL_TABLET | Freq: Every day | ORAL | 5 refills | Status: DC

## 2017-03-12 MED ORDER — DOLUTEGRAVIR SODIUM 50 MG PO TABS: 50.0000 mg | ORAL_TABLET | Freq: Every day | ORAL | 5 refills | Status: DC

## 2017-03-12 MED ORDER — AMOXICILLIN-POT CLAVULANATE 875-125 MG PO TABS: 1.0000 | ORAL_TABLET | Freq: Two times a day (BID) | ORAL | 0 refills | Status: AC

## 2017-03-12 NOTE — Patient Instructions (Signed)
4 medications to pick up with Mitch:   Tivicay and Symtuza once a day with food for your HIV.   Dapsone once a day for preventative antibiotic  Augmentin 1 tab twice a day for 2 weeks.   Please return in 2 - 3 weeks with either me or Tammy SoursGreg to see how you are doing and make sure you are feeling better.

## 2017-03-14 LAB — SALMONELLA AND SHIGELLA,CULTURE
MICRO NUMBER:: 90172860
SPECIMEN QUALITY:: ADEQUATE

## 2017-03-14 NOTE — Assessment & Plan Note (Signed)
No definitive labs on our records. I will check Genotype and RNA PCR today as well as LiverFibrosis panel, PT/INR and CMET. She will need documentation of substance abuse treatment for Medicaid to meet criteria to begin treatment. I discussed with her that although this needs to be treated to lower her r/f [facility] and cirrhosis we need to address her AIDS/HIV first as this takes significant priority.

## 2017-03-14 NOTE — Assessment & Plan Note (Addendum)
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 Will place her on Symtuza + Tivicay today. I will check HIV VL with full genotype and current CD4. I will also check beta-hCG serum, CMET, CBC w/D, RPR and urinalysis with urine GC/C Dapsone for PJP prophylaxis after she completes her Augmentin (I worry about adding too much with h/o CDI). Safe sex counseling provided.

## 2017-03-14 NOTE — Assessment & Plan Note (Signed)
This has not been a chronic problem for Himani and has been intermittent over the last few weeks and has had some improvement without intervention. She likely has viral enteritis that was passed to her from her children. I advised bland diet, avoidance of dairy products and increase fluid intake (Pedialyte, Gatorade, Water, Soups/broths, popsicles). Good hand hygeine so she and her young kids do not keep passing this amongst themselves. If she does not have improvement in the next 4-5 days or worsening of symptoms I will send her home with a stool collection kit to submit a same-day sample and test for CDiff PCR and culture for OI enteric pathogen.  ADDENDUM: she submitted a sample today prior to leaving due to transportation concern.

## 2017-03-14 NOTE — Assessment & Plan Note (Addendum)
I am fairly certain she has pneumonia based on her exam today. It is also possible she may have had flu that could have set this off a few weeks ago. I will call [facility] to obtain chest x-ray results from yesterday's study. I do worry about PJP considering her poor CD4 counts however being she has had a more acute onset and no hypoxia when walking in the hallway I am more reassured this is just CAP. She has extremely poor dentition and at risk for some anaerobic involvement. I will prescribe her 14 days of Augmentin pending results of her CXR report. I will check pneumocystis DFA smear and respiratory culture on sputum sample she submitted today in clinic. Strep pneumo urinary antigen today as well.   ADDENDUM: CXR results reveal likely multifocal pneumonia with streaky consolidations in the right upper, right lingula and left lower lobe. I would like to cover her with Azithromycin or Doxycyline with the Augmentin however she has not tolerated these in the past. Will have her return in 2 weeks to ensure she has had improvement. I instructed her to report to UC/ED should she have no improvement or worsening of symptoms.

## 2017-03-14 NOTE — Assessment & Plan Note (Signed)
I will check UDS today. She will continue her suboxone clinic.

## 2017-03-15 ENCOUNTER — Telehealth: Payer: Self-pay | Admitting: *Deleted

## 2017-03-15 DIAGNOSIS — R110 Nausea: Secondary | ICD-10-CM

## 2017-03-15 LAB — COMPLETE METABOLIC PANEL WITH GFR
AG RATIO: 1.2 (calc) (ref 1.0–2.5)
ALT: 9 U/L (ref 6–29)
AST: 16 U/L (ref 10–30)
Albumin: 3.9 g/dL (ref 3.6–5.1)
Alkaline phosphatase (APISO): 66 U/L (ref 33–115)
BUN: 8 mg/dL (ref 7–25)
CALCIUM: 8.9 mg/dL (ref 8.6–10.2)
CO2: 24 mmol/L (ref 20–32)
Chloride: 99 mmol/L (ref 98–110)
Creat: 0.78 mg/dL (ref 0.50–1.10)
GFR, EST AFRICAN AMERICAN: 121 mL/min/{1.73_m2} (ref >=60)
GFR, EST NON AFRICAN AMERICAN: 104 mL/min/{1.73_m2} (ref >=60)
GLOBULIN: 3.2 g/dL (ref 1.9–3.7)
Glucose, Bld: 100 mg/dL — ABNORMAL HIGH (ref 65–99)
Potassium: 3.2 mmol/L — ABNORMAL LOW (ref 3.5–5.3)
SODIUM: 137 mmol/L (ref 135–146)
TOTAL PROTEIN: 7.1 g/dL (ref 6.1–8.1)
Total Bilirubin: 0.4 mg/dL (ref 0.2–1.2)

## 2017-03-15 LAB — LIPID PANEL
Cholesterol: 119 mg/dL (ref <200)
HDL: 24 mg/dL — ABNORMAL LOW (ref >50)
LDL CHOLESTEROL (CALC): 78 mg/dL
Non-HDL Cholesterol (Calc): 95 mg/dL (calc) (ref <130)
Total CHOL/HDL Ratio: 5 (calc) — ABNORMAL HIGH (ref <5.0)
Triglycerides: 90 mg/dL (ref <150)

## 2017-03-15 LAB — CBC WITH DIFFERENTIAL/PLATELET
BASOS PCT: 0.1 %
Basophils Absolute: 7 cells/uL (ref 0–200)
Eosinophils Absolute: 7 cells/uL — ABNORMAL LOW (ref 15–500)
Eosinophils Relative: 0.1 %
HCT: 35.7 % (ref 35.0–45.0)
Hemoglobin: 11.5 g/dL — ABNORMAL LOW (ref 11.7–15.5)
Lymphs Abs: 884 cells/uL (ref 850–3900)
MCH: 26 pg — ABNORMAL LOW (ref 27.0–33.0)
MCHC: 32.2 g/dL (ref 32.0–36.0)
MCV: 80.6 fL (ref 80.0–100.0)
Monocytes Relative: 5.1 %
Neutro Abs: 5461 cells/uL (ref 1500–7800)
Neutrophils Relative %: 81.5 %
PLATELETS: 224 10*3/uL (ref 140–400)
RBC: 4.43 10*6/uL (ref 3.80–5.10)
RDW: 17 % — ABNORMAL HIGH (ref 11.0–15.0)
TOTAL LYMPHOCYTE: 13.2 %
WBC: 6.7 10*3/uL (ref 3.8–10.8)
WBCMIX: 342 {cells}/uL (ref 200–950)

## 2017-03-15 LAB — HCG, QUANTITATIVE, PREGNANCY: HCG, Total, QN: <2 m[IU]/mL

## 2017-03-15 LAB — PROTIME-INR
INR: 1.1
Prothrombin Time: 11.2 s (ref 9.0–11.5)

## 2017-03-15 LAB — P. CARINII DFA

## 2017-03-15 LAB — RPR: RPR: NONREACTIVE

## 2017-03-15 MED ORDER — ONDANSETRON HCL 4 MG PO TABS: 4.0000 mg | ORAL_TABLET | Freq: Three times a day (TID) | ORAL | 0 refills | Status: DC | PRN

## 2017-03-15 NOTE — Telephone Encounter (Signed)
I hate to hear that. I am happy to send her in some nausea medicine.It is OK to separate the other medications from her Tivicay/Symtuza. Taking them with food will help some of her symptoms as best as she can tolerate this.   Also please let her know that I got her chest x ray results and talked with her primary care NP Tobi BastosAnna and she she does seem to have pneumonia in both her left and right lungs. I think we should repeat this chest x ray when she returns for her follow up appointment.   Will you please send in ondansetron (zofran) 4 mg SL tablets #45, 0 refill, Take 1 tablet every 8 hours as needed for nausea to whatever pharmacy she requests? The last fill I sent in here in [place] so Mitch could pick it up for her on the way back to her home.   Thank you Feliz Beamravis!

## 2017-03-15 NOTE — Progress Notes (Signed)
Strep pneumoniae Ag detected - she is currently on Augmentin. Will continue this for her x 2 weeks as I presume her CD4 is very low and she would need a longer course of treatment for this. Salmonella/shigella stool negative - likely this is a common enteritis.

## 2017-03-15 NOTE — Telephone Encounter (Signed)
Per message from OzanStephanie called the patient to advise will send in Zofran for her and find out where she wants the Rx to go. She advised [facility] in [place] on [address] Rx sent

## 2017-03-15 NOTE — Telephone Encounter (Signed)
Patient called to report that she has been having sever nausea since yesterday. She advise no vomiting or diarrhea and is able to drink and eat. She also advised she is not taking all her medications at the same time. She dost take the two Hiv meds together. She advised she needs something for the nausea. Advised will let Judeth CornfieldStephanie know and call her back once she responds.

## 2017-03-15 NOTE — Addendum Note (Signed)
Addended by: Blanchard KelchIXON,  N on: 03/15/2017 12:27 PM   Modules accepted: Orders

## 2017-03-15 NOTE — Addendum Note (Signed)
Addended by: Lurlean LeydenPOOLE, Destini Cambre F on: 03/15/2017 05:22 PM   Modules accepted: Orders

## 2017-03-16 LAB — LEGIONELLA ANTIGEN, URINE: LEGIONELLA ANTIGEN, URINE: NOT DETECTED

## 2017-03-16 LAB — STREP PNEUMONIAE URINARY ANTIGEN: Strep Pneumo Urinary Antigen: DETECTED — CR

## 2017-03-17 LAB — HEPATITIS C GENOTYPE

## 2017-03-17 LAB — CLOSTRIDIUM DIFFICILE CULTURE-FECAL

## 2017-03-17 LAB — HEPATITIS C RNA QUANTITATIVE
HCV Quantitative Log: 6.21 Log IU/mL — ABNORMAL HIGH
HCV RNA, PCR, QN: 1620000 IU/mL — ABNORMAL HIGH

## 2017-03-18 LAB — LIVER FIBROSIS, FIBROTEST-ACTITEST
ALPHA-2-MACROGLOBULIN: 225 mg/dL (ref 106–279)
ALT: 9 U/L (ref 6–29)
Apolipoprotein A1: 70 mg/dL — ABNORMAL LOW (ref 101–198)
BILIRUBIN: 0.2 mg/dL (ref 0.2–1.2)
FIBROSIS SCORE: 0.06
GGT: 12 U/L (ref 3–40)
HAPTOGLOBIN: 377 mg/dL — AB (ref 43–212)
Necroinflammat ACT Score: 0.02
Reference ID: 2329760

## 2017-03-18 LAB — URINE DRUGS OF ABUSE SCREEN W ALC, ROUTINE (REF LAB)
ALCOHOL, ETHYL (U): NEGATIVE
AMPHETAMINE: POSITIVE — AB
AMPHETAMINES (1000 ng/mL SCRN): POSITIVE — AB
BARBITURATES: NEGATIVE
BENZODIAZEPINES: NEGATIVE
COCAINE METABOLITES: NEGATIVE
MARIJUANA MET (50 NG/ML SCRN): NEGATIVE
METHADONE: NEGATIVE
METHAQUALONE: NEGATIVE
OPIATES: NEGATIVE
PHENCYCLIDINE: NEGATIVE
PROPOXYPHENE: NEGATIVE

## 2017-03-23 LAB — HIV-1 INTEGRASE GENOTYPE

## 2017-03-23 LAB — OVA AND PARASITE EXAMINATION
CONCENTRATE RESULT: NONE SEEN
TRICHROME RESULT: NONE SEEN

## 2017-03-23 LAB — HIV RNA, RTPCR W/R GT (RTI, PI,INT)
HIV 1 RNA QUANT: 31900 {copies}/mL — AB
HIV-1 RNA QUANT, LOG: 4.5 {Log_copies}/mL — AB

## 2017-03-23 LAB — HIV-1 GENOTYPE: HIV-1 GENOTYPE: DETECTED — AB

## 2017-03-23 LAB — TIQ-NTM

## 2017-03-26 ENCOUNTER — Ambulatory Visit: Payer: Medicaid Other | Admitting: Family

## 2017-03-29 ENCOUNTER — Ambulatory Visit (INDEPENDENT_AMBULATORY_CARE_PROVIDER_SITE_OTHER): Payer: Medicaid Other | Admitting: Family

## 2017-03-29 ENCOUNTER — Encounter: Payer: Self-pay | Admitting: Family

## 2017-03-29 VITALS — BP 103/69 | HR 88 | Temp 98.4°F | Ht 62.0 in | Wt 109.8 lb

## 2017-03-29 DIAGNOSIS — R05 Cough: Secondary | ICD-10-CM | POA: Diagnosis present

## 2017-03-29 DIAGNOSIS — B20 Human immunodeficiency virus [HIV] disease: Secondary | ICD-10-CM | POA: Diagnosis not present

## 2017-03-29 DIAGNOSIS — R059 Cough, unspecified: Secondary | ICD-10-CM

## 2017-03-29 DIAGNOSIS — R509 Fever, unspecified: Secondary | ICD-10-CM
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
## 2017-03-29 NOTE — Assessment & Plan Note (Signed)
Ms. Frances Ball completed her Augmentin without complication and is improved with no further symptoms. No additional treatment is needed at this time. Follow up if symptoms return.

## 2017-03-29 NOTE — Assessment & Plan Note (Signed)
Stable with Tivicay and Symtuza with initial nausea that has since resolved. She has missed 1 dose in the last two weeks. Continue current dosage of Tivicay and Symtuza. Continue Dapsone for OI prophylaxis. Follow up in 2 weeks for recheck of viral load and CD4 count. She will need to be undetectable for dental work to be completed. She is due for a PAP which I will have her schedule.

## 2017-03-29 NOTE — Patient Instructions (Signed)
Please continue to take you medications as prescribed.   No additional treatment is needed for pneumonia.   Follow up with Judeth CornfieldStephanie for your HIV and a PAP.

## 2017-04-10 DIAGNOSIS — R0602 Shortness of breath: Secondary | ICD-10-CM | POA: Diagnosis not present

## 2017-04-12 DIAGNOSIS — R21 Rash and other nonspecific skin eruption: Secondary | ICD-10-CM | POA: Insufficient documentation

## 2017-04-12 MED ORDER — INSULIN LISPRO 100 UNIT/ML ~~LOC~~ SOLN
1.00 | SUBCUTANEOUS | Status: DC
Start: 2017-04-12 — End: 2017-04-12

## 2017-04-12 MED ORDER — OSELTAMIVIR PHOSPHATE 75 MG PO CAPS
75.00 | ORAL_CAPSULE | ORAL | Status: DC
Start: 2017-04-14 — End: 2017-04-12

## 2017-04-12 MED ORDER — MUPIROCIN 2 % EX OINT
TOPICAL_OINTMENT | CUTANEOUS | Status: DC
Start: 2017-04-14 — End: 2017-04-12

## 2017-04-12 MED ORDER — DARUNAVIR-COBICISTAT 800-150 MG PO TABS
1.00 | ORAL_TABLET | ORAL | Status: DC
Start: 2017-04-12 — End: 2017-04-12

## 2017-04-12 MED ORDER — DAPSONE 100 MG PO TABS
100.00 | ORAL_TABLET | ORAL | Status: DC
Start: 2017-04-15 — End: 2017-04-12

## 2017-04-12 MED ORDER — TENOFOVIR ALAFENAMIDE FUMARATE 25 MG PO TABS
12.50 | ORAL_TABLET | ORAL | Status: DC
Start: 2017-04-12 — End: 2017-04-12

## 2017-04-12 MED ORDER — ENOXAPARIN SODIUM 40 MG/0.4ML ~~LOC~~ SOLN
40.00 | SUBCUTANEOUS | Status: DC
Start: 2017-04-15 — End: 2017-04-12

## 2017-04-12 MED ORDER — DOLUTEGRAVIR SODIUM 50 MG PO TABS
50.00 | ORAL_TABLET | ORAL | Status: DC
Start: 2017-04-12 — End: 2017-04-12

## 2017-04-12 MED ORDER — DEXTROSE 10 % IV SOLN
125.00 | INTRAVENOUS | Status: DC
Start: ? — End: 2017-04-12

## 2017-04-12 MED ORDER — EMTRICITABINE 200 MG PO CAPS
200.00 | ORAL_CAPSULE | ORAL | Status: DC
Start: 2017-04-12 — End: 2017-04-12

## 2017-04-15 ENCOUNTER — Ambulatory Visit (INDEPENDENT_AMBULATORY_CARE_PROVIDER_SITE_OTHER): Payer: Medicaid Other | Admitting: Infectious Diseases

## 2017-04-15 ENCOUNTER — Other Ambulatory Visit: Payer: Medicaid Other

## 2017-04-15 ENCOUNTER — Other Ambulatory Visit: Payer: Self-pay | Admitting: *Deleted

## 2017-04-15 DIAGNOSIS — B20 Human immunodeficiency virus [HIV] disease: Secondary | ICD-10-CM | POA: Diagnosis not present

## 2017-04-15 DIAGNOSIS — B182 Chronic viral hepatitis C: Secondary | ICD-10-CM | POA: Diagnosis not present

## 2017-04-15 DIAGNOSIS — A599 Trichomoniasis, unspecified: Secondary | ICD-10-CM | POA: Diagnosis not present

## 2017-04-15 DIAGNOSIS — N942 Vaginismus: Secondary | ICD-10-CM | POA: Diagnosis not present

## 2017-04-15 MED ORDER — BUPRENORPHINE HCL-NALOXONE HCL 8-2 MG SL SUBL
1.00 | SUBLINGUAL_TABLET | SUBLINGUAL | Status: DC
Start: 2017-04-14 — End: 2017-04-15

## 2017-04-15 MED ORDER — TRIAMCINOLONE ACETONIDE 0.1 % EX CREA
TOPICAL_CREAM | CUTANEOUS | Status: DC
Start: 2017-04-14 — End: 2017-04-15

## 2017-04-15 MED ORDER — HYDROCORTISONE 2.5 % EX CREA
TOPICAL_CREAM | CUTANEOUS | Status: DC
Start: 2017-04-14 — End: 2017-04-15

## 2017-04-15 MED ORDER — BICTEGRAVIR-EMTRICITAB-TENOFOV 50-200-25 MG PO TABS: 1.0000 | ORAL_TABLET | Freq: Every day | ORAL | 5 refills | Status: DC

## 2017-04-15 MED ORDER — ATAZANAVIR-COBICISTAT 300-150 MG PO TABS: 1.0000 | ORAL_TABLET | Freq: Every day | ORAL | 3 refills | Status: DC

## 2017-04-15 MED ORDER — METRONIDAZOLE 500 MG PO TABS: 500.0000 mg | ORAL_TABLET | Freq: Two times a day (BID) | ORAL | 0 refills | Status: AC

## 2017-04-15 NOTE — Assessment & Plan Note (Signed)
She is going to start therapy sessions for this soon and we decided to postpone pap smear today until she is more resilient.
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
## 2017-04-15 NOTE — Assessment & Plan Note (Addendum)
She was (+) amphetamines on UDS from February - will need to discuss at future appointments after we address her AIDS and compliance prior to considering treatment for hep c infection. Medicaid will require repeat UDS and substance abuse counseling.   TBili is OK prior to initiating Evotaz - will expectedly increase d/t medication without affecting synthetic function of liver.

## 2017-04-15 NOTE — Patient Instructions (Signed)
Stop your Symtuza and Tivicay please.   Continue your Dapsone every day.   Start taking Biktarvy AND Evotaz once a day for your HIV.

## 2017-04-15 NOTE — Assessment & Plan Note (Addendum)
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 Will stop Tivicay and Symtuza as the sulfa component of darunavir is likely offending agent for her. She previously tolerated Prezcobix well, although not certain she was actually taking it consistently. Will change regimen to Evotaz + Biktarvy. 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   S/E and proper administration discussed. She will return in 4 weeks - will check labs at that time.   I asked her to please reconsider the flu and pneumonia vaccines. Explained that poor dentition makes her at higher risk than most for pneumonia let alone her HIV. She would likely have great benefit from reducing the need for further hospitalizations.

## 2017-04-16 ENCOUNTER — Encounter: Payer: Self-pay | Admitting: Infectious Diseases

## 2017-04-16 NOTE — Progress Notes (Signed)
Called by PT, having facial swelling after taking 2 doses of flatly. I asked her to stop flagyl, take Benadryl, call us if worse, otherwise call clinic on Monday

## 2017-04-16 NOTE — Assessment & Plan Note (Signed)
Diagnosed on wet prep in 07-2016 but never treated - will send in Flagyl 500 mg BID x 7d. Precautions for avoiding sex, partner treatment and avoiding alcohol were provided.

## 2017-05-14 ENCOUNTER — Ambulatory Visit: Payer: Medicaid Other | Admitting: Infectious Diseases

## 2017-06-02 ENCOUNTER — Encounter: Payer: Self-pay | Admitting: Infectious Diseases

## 2017-06-02 ENCOUNTER — Ambulatory Visit (INDEPENDENT_AMBULATORY_CARE_PROVIDER_SITE_OTHER): Payer: Medicaid Other | Admitting: Pharmacist

## 2017-06-02 ENCOUNTER — Ambulatory Visit (INDEPENDENT_AMBULATORY_CARE_PROVIDER_SITE_OTHER): Payer: Medicaid Other | Admitting: Infectious Diseases

## 2017-06-02 VITALS — BP 107/73 | HR 97 | Temp 98.0°F | Wt 108.0 lb

## 2017-06-02 DIAGNOSIS — N942 Vaginismus: Secondary | ICD-10-CM

## 2017-06-02 DIAGNOSIS — B182 Chronic viral hepatitis C: Secondary | ICD-10-CM | POA: Diagnosis not present

## 2017-06-02 DIAGNOSIS — Z0000 Encounter for general adult medical examination without abnormal findings: Secondary | ICD-10-CM | POA: Diagnosis not present

## 2017-06-02 DIAGNOSIS — B20 Human immunodeficiency virus [HIV] disease: Secondary | ICD-10-CM

## 2017-06-02 DIAGNOSIS — Z23 Encounter for immunization: Secondary | ICD-10-CM

## 2017-06-02 DIAGNOSIS — H5713 Ocular pain, bilateral: Secondary | ICD-10-CM | POA: Insufficient documentation

## 2017-06-02 DIAGNOSIS — H5711 Ocular pain, right eye: Secondary | ICD-10-CM

## 2017-06-02 MED ORDER — PNEUMOCOCCAL 13-VAL CONJ VACC IM SUSP
0.5000 mL | INTRAMUSCULAR | Status: AC
  Administered 2017-06-02: 0.5 mL via INTRAMUSCULAR

## 2017-06-02 NOTE — Assessment & Plan Note (Signed)
Will refer out to ophthalmology.

## 2017-06-02 NOTE — Assessment & Plan Note (Signed)
Will refer to GYN. We also discussed counseling and pelvic floor physical therapist for assistance in managing this for her.

## 2017-06-02 NOTE — Assessment & Plan Note (Signed)
Doing well reportedly with adherence on Biktarvy + Evotaz and dapsone. She has shown much more engagement and interest in her self care recently which is great to see. Will recheck her viral load and CD4 counts today. She will return in one month.

## 2017-06-02 NOTE — Assessment & Plan Note (Signed)
Treatment naive, 1a genotype with F-score 0 per fibrosure serum panel. Will set up baseline ultrasound today. Will have her meet with pharmacy team as well to begin discussion about treatment.
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
## 2017-06-02 NOTE — Patient Instructions (Addendum)
Try some over the counter zyrtec - D or regular zyrtec (generic is perfectly fine).  Mucinex over the counter to break up things.  Lots of fluids (water if you can).   Will check your labs today and get an ultrasound of your liver so we can start treatment.   Prevnar vaccine today - there is a second pneumonia shot that we can talk about in 2 months.

## 2017-06-02 NOTE — Assessment & Plan Note (Signed)
She is willing to get pneumonia vaccine today. Will start with Prevnar first and reassess in 8 weeks readiness for pneumovax. She is VERY reluctant for vaccines.

## 2017-06-03 NOTE — Progress Notes (Signed)
  Met with Frances Ball to sign paperwork for Medicaid to get Mavyret for her Hep C infection.

## 2017-06-22 ENCOUNTER — Other Ambulatory Visit: Payer: Self-pay

## 2017-06-22 ENCOUNTER — Other Ambulatory Visit: Payer: Medicaid Other

## 2017-06-22 ENCOUNTER — Ambulatory Visit (HOSPITAL_COMMUNITY): Payer: Medicaid Other

## 2017-06-22 DIAGNOSIS — B182 Chronic viral hepatitis C: Secondary | ICD-10-CM

## 2017-06-22 DIAGNOSIS — B20 Human immunodeficiency virus [HIV] disease: Secondary | ICD-10-CM

## 2017-06-23 LAB — CBC WITH DIFFERENTIAL/PLATELET
BASOS ABS: 9 {cells}/uL (ref 0–200)
Basophils Relative: 0.2 %
Eosinophils Absolute: 32 cells/uL (ref 15–500)
Eosinophils Relative: 0.7 %
HCT: 39.8 % (ref 35.0–45.0)
HEMOGLOBIN: 12.7 g/dL (ref 11.7–15.5)
Lymphs Abs: 1373 cells/uL (ref 850–3900)
MCH: 24.1 pg — ABNORMAL LOW (ref 27.0–33.0)
MCHC: 31.9 g/dL — AB (ref 32.0–36.0)
MCV: 75.4 fL — ABNORMAL LOW (ref 80.0–100.0)
Monocytes Relative: 7.3 %
NEUTROS ABS: 2759 {cells}/uL (ref 1500–7800)
Neutrophils Relative %: 61.3 %
PLATELETS: 243 10*3/uL (ref 140–400)
RBC: 5.28 10*6/uL — ABNORMAL HIGH (ref 3.80–5.10)
RDW: 17.9 % — ABNORMAL HIGH (ref 11.0–15.0)
Total Lymphocyte: 30.5 %
WBC mixed population: 329 cells/uL (ref 200–950)
WBC: 4.5 10*3/uL (ref 3.8–10.8)

## 2017-06-23 LAB — COMPLETE METABOLIC PANEL WITH GFR
AG RATIO: 1.4 (calc) (ref 1.0–2.5)
ALKALINE PHOSPHATASE (APISO): 72 U/L (ref 33–115)
ALT: 10 U/L (ref 6–29)
AST: 22 U/L (ref 10–30)
Albumin: 4.5 g/dL (ref 3.6–5.1)
BUN/Creatinine Ratio: 13 (calc) (ref 6–22)
BUN: 14 mg/dL (ref 7–25)
CALCIUM: 9.4 mg/dL (ref 8.6–10.2)
CO2: 28 mmol/L (ref 20–32)
CREATININE: 1.12 mg/dL — AB (ref 0.50–1.10)
Chloride: 99 mmol/L (ref 98–110)
GFR, EST NON AFRICAN AMERICAN: 67 mL/min/{1.73_m2} (ref >=60)
GFR, Est African American: 78 mL/min/{1.73_m2} (ref >=60)
GLOBULIN: 3.3 g/dL (ref 1.9–3.7)
Glucose, Bld: 87 mg/dL (ref 65–99)
POTASSIUM: 3.7 mmol/L (ref 3.5–5.3)
SODIUM: 138 mmol/L (ref 135–146)
Total Bilirubin: 3.6 mg/dL — ABNORMAL HIGH (ref 0.2–1.2)
Total Protein: 7.8 g/dL (ref 6.1–8.1)

## 2017-06-23 LAB — PROTIME-INR
INR: 1
Prothrombin Time: 10.4 s (ref 9.0–11.5)

## 2017-06-24 ENCOUNTER — Telehealth: Payer: Self-pay | Admitting: Pharmacist

## 2017-06-24 LAB — HIV-1 RNA QUANT-NO REFLEX-BLD
HIV 1 RNA Quant: 108000 copies/mL — ABNORMAL HIGH
HIV-1 RNA Quant, Log: 5.03 Log copies/mL — ABNORMAL HIGH

## 2017-06-24 NOTE — Telephone Encounter (Signed)
Judeth Cornfield asked me to check up on Frances Ball since her HIV viral load is still elevated.  I called [facility] to check her fill history and she filled both Evotaz and Biktarvy on March 14th for a 30 day supply and has not refilled since then.  Her HIV viral load is up to 108,000 when checked 2 days ago.  There is a HIV viral load that was collected on 5/2 but not resulted.  I called [facility] and they have no record of the lab test being done ? Seems like it was lost or not done at all.  I called [facility] to see about her compliance and she states she is doing fine on the medications and has not missed a day since restarting. She did say that she started a few weeks after picking it up because she wanted her rash to fully recover before starting a new regimen.  She also said she was throwing up the first couple of days but it resolved ~2-3 weeks ago.  She states she has 4-5 days left of medications and needs a refill. Her bilirubin is a little elevated which would suggest that she is at least taking Evotaz (which would be inappropriate by itself) but her viral load suggests otherwise.  Information relayed to [place].  Patient has a f/u scheduled the first week of June.

## 2017-06-24 NOTE — Telephone Encounter (Signed)
Thank you for your help following up with Frances Ball. She is also working with [employer]. I am hopeful she had a lapse in meds prior to between drug rash to darunavir and hospitalization for pneumonia but now on track. Will recheck in 4-6 weeks again and discuss further at upcoming visit.

## 2017-06-29 ENCOUNTER — Encounter: Payer: Self-pay | Admitting: Infectious Diseases

## 2017-06-30 ENCOUNTER — Ambulatory Visit (HOSPITAL_COMMUNITY): Payer: Medicaid Other

## 2017-07-06 ENCOUNTER — Encounter: Payer: Self-pay | Admitting: Infectious Diseases

## 2017-07-06 ENCOUNTER — Ambulatory Visit (INDEPENDENT_AMBULATORY_CARE_PROVIDER_SITE_OTHER): Payer: Medicaid Other | Admitting: Infectious Diseases

## 2017-07-06 VITALS — BP 109/71 | HR 93 | Temp 98.0°F | Wt 107.0 lb

## 2017-07-06 DIAGNOSIS — H5713 Ocular pain, bilateral: Secondary | ICD-10-CM | POA: Diagnosis not present

## 2017-07-06 DIAGNOSIS — B182 Chronic viral hepatitis C: Secondary | ICD-10-CM | POA: Diagnosis not present

## 2017-07-06 DIAGNOSIS — R51 Headache: Secondary | ICD-10-CM

## 2017-07-06 DIAGNOSIS — B20 Human immunodeficiency virus [HIV] disease: Secondary | ICD-10-CM

## 2017-07-06 DIAGNOSIS — H539 Unspecified visual disturbance: Secondary | ICD-10-CM | POA: Diagnosis not present

## 2017-07-06 DIAGNOSIS — R519 Headache, unspecified: Secondary | ICD-10-CM

## 2017-07-06 NOTE — Assessment & Plan Note (Signed)
Continue Ware[facility] managervotaz and Biktarvy. Counseled today on adherence. Goal will be to see some good reduction in her labs and then once suppressed and confident her adherence is good will maintain on Biktarvy alone. Will have her return in 2 months with repeat labs. Advised to inform me if she has trouble taking medications daily. Continue Dapsone for OI prophylaxis.
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
## 2017-07-06 NOTE — Assessment & Plan Note (Signed)
May be related do Biktarvy however she has other symptoms concerning to evaluate for intracranial process related to underlying OI / IRIS. Will have her return for MRI of brain w/ and w/o contrast as well as blood work for cryptococcal antigen and toxo IgG. May need to consider LP pending results. Will also place referral for neurology.

## 2017-07-06 NOTE — Assessment & Plan Note (Signed)
May be related to headaches - plan as above. Will also refer to Ophthalmology.

## 2017-07-06 NOTE — Patient Instructions (Addendum)
Continue the evotaz and biktarvy once a day.   Please return in 2 months to recheck your labs. Will arrange the ultrasound appointment in the mean time so when you come back we can start hep c treatment.

## 2017-07-06 NOTE — Assessment & Plan Note (Signed)
Will get liver ultrasound for pre-treatment staging. No stigmata of cirrhosis on exam. Counseled on importance of excellent daily adherence for chance of cure.  Plan for Harvoni x 12 weeks with HIV coinfection.

## 2017-07-07 ENCOUNTER — Encounter (INDEPENDENT_AMBULATORY_CARE_PROVIDER_SITE_OTHER): Payer: Self-pay

## 2017-07-07 NOTE — Addendum Note (Signed)
Addended by: Blanchard KelchIXON, Karianne Nogueira N on: 07/07/2017 11:49 AM   Modules accepted: Orders

## 2017-07-15 ENCOUNTER — Encounter: Payer: Self-pay | Admitting: Infectious Diseases

## 2017-07-28 ENCOUNTER — Other Ambulatory Visit: Payer: Self-pay | Admitting: Infectious Diseases

## 2017-07-28 ENCOUNTER — Ambulatory Visit (HOSPITAL_COMMUNITY)
Admission: RE | Admit: 2017-07-28 | Discharge: 2017-07-28 | Disposition: A | Discharge status: Home / Self Care | Payer: Medicaid Other | Source: Ambulatory Visit | Attending: Infectious Diseases | Admitting: Infectious Diseases

## 2017-07-28 DIAGNOSIS — R519 Headache, unspecified: Secondary | ICD-10-CM

## 2017-07-28 DIAGNOSIS — R51 Headache: Principal | ICD-10-CM

## 2017-07-29 ENCOUNTER — Encounter: Payer: Self-pay | Admitting: Infectious Diseases

## 2017-08-03 ENCOUNTER — Encounter: Payer: Self-pay | Admitting: Infectious Diseases

## 2017-08-03 DIAGNOSIS — N6320 Unspecified lump in the left breast, unspecified quadrant: Principal | ICD-10-CM

## 2017-08-03 DIAGNOSIS — N6310 Unspecified lump in the right breast, unspecified quadrant: Secondary | ICD-10-CM

## 2017-08-13 ENCOUNTER — Encounter: Payer: Self-pay | Admitting: Infectious Diseases

## 2017-08-13 ENCOUNTER — Other Ambulatory Visit: Payer: Self-pay | Admitting: Infectious Diseases

## 2017-08-13 DIAGNOSIS — B20 Human immunodeficiency virus [HIV] disease: Secondary | ICD-10-CM

## 2017-08-16 ENCOUNTER — Other Ambulatory Visit: Payer: Self-pay | Admitting: Infectious Diseases

## 2017-08-16 DIAGNOSIS — N6320 Unspecified lump in the left breast, unspecified quadrant: Principal | ICD-10-CM

## 2017-08-16 DIAGNOSIS — N6310 Unspecified lump in the right breast, unspecified quadrant: Secondary | ICD-10-CM

## 2017-08-20 ENCOUNTER — Other Ambulatory Visit: Payer: Medicaid Other

## 2017-08-25 ENCOUNTER — Encounter: Payer: Self-pay | Admitting: Infectious Diseases

## 2017-08-27 DIAGNOSIS — B952 Enterococcus as the cause of diseases classified elsewhere: Secondary | ICD-10-CM

## 2017-08-27 DIAGNOSIS — A419 Sepsis, unspecified organism: Secondary | ICD-10-CM | POA: Diagnosis not present

## 2017-08-27 DIAGNOSIS — R17 Unspecified jaundice: Secondary | ICD-10-CM

## 2017-08-27 DIAGNOSIS — N390 Urinary tract infection, site not specified: Secondary | ICD-10-CM

## 2017-08-27 DIAGNOSIS — J189 Pneumonia, unspecified organism: Secondary | ICD-10-CM

## 2017-08-28 DIAGNOSIS — N390 Urinary tract infection, site not specified: Secondary | ICD-10-CM | POA: Diagnosis not present

## 2017-08-28 DIAGNOSIS — A419 Sepsis, unspecified organism: Secondary | ICD-10-CM | POA: Diagnosis not present

## 2017-08-28 DIAGNOSIS — J189 Pneumonia, unspecified organism: Secondary | ICD-10-CM | POA: Diagnosis not present

## 2017-08-28 DIAGNOSIS — R17 Unspecified jaundice: Secondary | ICD-10-CM | POA: Diagnosis not present

## 2017-08-29 DIAGNOSIS — N390 Urinary tract infection, site not specified: Secondary | ICD-10-CM | POA: Diagnosis not present

## 2017-08-29 DIAGNOSIS — A419 Sepsis, unspecified organism: Secondary | ICD-10-CM | POA: Diagnosis not present

## 2017-08-29 DIAGNOSIS — R17 Unspecified jaundice: Secondary | ICD-10-CM | POA: Diagnosis not present

## 2017-08-29 DIAGNOSIS — J189 Pneumonia, unspecified organism: Secondary | ICD-10-CM | POA: Diagnosis not present

## 2017-09-03 ENCOUNTER — Encounter: Payer: Medicaid Other | Admitting: Infectious Diseases

## 2017-09-03 ENCOUNTER — Telehealth: Payer: Self-pay

## 2017-09-03 NOTE — Telephone Encounter (Signed)
Need signed relased form faxed to [phone].

## 2017-09-09 ENCOUNTER — Ambulatory Visit: Payer: Self-pay | Admitting: Neurology

## 2017-09-09 ENCOUNTER — Telehealth: Payer: Self-pay | Admitting: *Deleted

## 2017-09-09 NOTE — Telephone Encounter (Signed)
Patient no showed new pt appt on 09/09/2017 @ 2:30 pm.

## 2017-09-10 ENCOUNTER — Encounter: Payer: Self-pay | Admitting: Neurology

## 2017-09-11 ENCOUNTER — Other Ambulatory Visit: Payer: Self-pay | Admitting: Infectious Diseases

## 2017-09-11 DIAGNOSIS — B20 Human immunodeficiency virus [HIV] disease: Secondary | ICD-10-CM

## 2017-09-28 ENCOUNTER — Other Ambulatory Visit: Payer: Medicaid Other

## 2017-10-07 ENCOUNTER — Other Ambulatory Visit: Payer: Medicaid Other

## 2017-10-07 DIAGNOSIS — B182 Chronic viral hepatitis C: Secondary | ICD-10-CM

## 2017-10-07 DIAGNOSIS — R51 Headache: Principal | ICD-10-CM

## 2017-10-07 DIAGNOSIS — R519 Headache, unspecified: Secondary | ICD-10-CM

## 2017-10-07 DIAGNOSIS — B20 Human immunodeficiency virus [HIV] disease: Secondary | ICD-10-CM

## 2017-10-13 ENCOUNTER — Other Ambulatory Visit: Payer: Self-pay | Admitting: Infectious Diseases

## 2017-10-13 DIAGNOSIS — B20 Human immunodeficiency virus [HIV] disease: Secondary | ICD-10-CM

## 2017-10-13 LAB — LIVER FIBROSIS, FIBROTEST-ACTITEST
ALT: 9 U/L (ref 6–29)
APOLIPOPROTEIN A1: 132 mg/dL (ref 101–198)
Alpha-2-Macroglobulin: 284 mg/dL — ABNORMAL HIGH (ref 106–279)
Bilirubin: 2.8 mg/dL — ABNORMAL HIGH (ref 0.2–1.2)
Fibrosis Score: 0.41
GGT: 12 U/L (ref 3–40)
Haptoglobin: 114 mg/dL (ref 43–212)
Necroinflammat ACT Score: 0.03
Reference ID: 2628671

## 2017-10-13 LAB — HIV-1 RNA QUANT-NO REFLEX-BLD
HIV 1 RNA Quant: <20 copies/mL
HIV-1 RNA QUANT, LOG: NOT DETECTED {Log_copies}/mL

## 2017-10-13 LAB — CRYPTOCOCCAL AG, LTX SCR RFLX TITER
Cryptococcal Ag Screen: NOT DETECTED
MICRO NUMBER: 91063431
SPECIMEN QUALITY:: ADEQUATE

## 2017-10-13 LAB — TOXOPLASMA GONDII ANTIBODY, IGG

## 2017-10-14 ENCOUNTER — Telehealth: Payer: Self-pay

## 2017-10-14 NOTE — Telephone Encounter (Signed)
Patient's name came up on overdue pap list. Called patient to find out when last pap was done. Patient was unable to tell me when her last pap was. Would like to have a pap smear same day as her appointment on 9/23 with Rexene AlbertsStephanie Dixon, NP. Will inform Judeth CornfieldStephanie about patient's request. Lorenso CourierJose L Tymere Depuy, CMA

## 2017-10-14 NOTE — Telephone Encounter (Signed)
If we are going to do that (which I am very happy to do) I need a 30 minute time slot with them please. Can we try to arrange this please?

## 2017-10-14 NOTE — Telephone Encounter (Signed)
Patient is scheduled for a 30 minute appointment [facility]  Jose L Maldonado, CMA

## 2017-10-25 ENCOUNTER — Encounter: Payer: Medicaid Other | Admitting: Infectious Diseases

## 2017-10-28 ENCOUNTER — Ambulatory Visit
Admission: RE | Admit: 2017-10-28 | Discharge: 2017-10-28 | Disposition: A | Discharge status: Home / Self Care | Payer: Medicaid Other | Source: Ambulatory Visit | Attending: Infectious Diseases | Admitting: Infectious Diseases

## 2017-10-28 DIAGNOSIS — N6320 Unspecified lump in the left breast, unspecified quadrant: Principal | ICD-10-CM

## 2017-10-28 DIAGNOSIS — N6310 Unspecified lump in the right breast, unspecified quadrant: Secondary | ICD-10-CM

## 2017-11-09 ENCOUNTER — Ambulatory Visit: Payer: Self-pay | Admitting: Neurology

## 2017-11-09 ENCOUNTER — Telehealth: Payer: Self-pay | Admitting: Neurology

## 2017-11-09 NOTE — Telephone Encounter (Signed)
Patient has no-showed 2 new appointments within the last few months (today and 8/8) please dismiss from practice thanks

## 2017-11-10 ENCOUNTER — Telehealth: Payer: Self-pay | Admitting: Neurology

## 2017-11-10 NOTE — Telephone Encounter (Signed)
FYI- Patient has no-showed for 2 New Patient appts in 2019.

## 2017-11-11 ENCOUNTER — Encounter: Payer: Self-pay | Admitting: Neurology

## 2017-11-15 ENCOUNTER — Other Ambulatory Visit: Payer: Self-pay

## 2017-11-15 ENCOUNTER — Other Ambulatory Visit: Payer: Self-pay | Admitting: Infectious Diseases

## 2017-11-15 DIAGNOSIS — B20 Human immunodeficiency virus [HIV] disease: Secondary | ICD-10-CM

## 2017-11-17 ENCOUNTER — Encounter: Payer: Self-pay | Admitting: Obstetrics & Gynecology

## 2017-11-18 ENCOUNTER — Encounter: Payer: Self-pay | Admitting: Infectious Diseases

## 2017-11-18 ENCOUNTER — Ambulatory Visit (INDEPENDENT_AMBULATORY_CARE_PROVIDER_SITE_OTHER): Payer: Medicaid Other | Admitting: Infectious Diseases

## 2017-11-18 VITALS — BP 127/91 | HR 93 | Temp 98.1°F | Ht 62.0 in | Wt 116.0 lb

## 2017-11-18 DIAGNOSIS — B182 Chronic viral hepatitis C: Secondary | ICD-10-CM

## 2017-11-18 DIAGNOSIS — B20 Human immunodeficiency virus [HIV] disease: Secondary | ICD-10-CM

## 2017-11-18 DIAGNOSIS — K0889 Other specified disorders of teeth and supporting structures: Secondary | ICD-10-CM

## 2017-11-18 DIAGNOSIS — Z832 Family history of diseases of the blood and blood-forming organs and certain disorders involving the immune mechanism: Secondary | ICD-10-CM | POA: Diagnosis not present

## 2017-11-18 MED ORDER — HYDROCODONE-ACETAMINOPHEN 5-325 MG PO TABS: 1.0000 | ORAL_TABLET | Freq: Four times a day (QID) | ORAL | 0 refills | Status: AC | PRN

## 2017-11-18 MED ORDER — BICTEGRAVIR-EMTRICITAB-TENOFOV 50-200-25 MG PO TABS: ORAL_TABLET | ORAL | 3 refills | Status: DC

## 2017-11-18 MED ORDER — GLECAPREVIR-PIBRENTASVIR 100-40 MG PO TABS: 3.0000 | ORAL_TABLET | Freq: Every day | ORAL | 1 refills | Status: DC

## 2017-11-18 MED ORDER — HYDROCODONE-ACETAMINOPHEN 5-325 MG PO TABS: 1.0000 | ORAL_TABLET | Freq: Four times a day (QID) | ORAL | 0 refills | Status: DC | PRN

## 2017-11-18 MED ORDER — DAPSONE 100 MG PO TABS: 100.0000 mg | ORAL_TABLET | Freq: Every day | ORAL | 2 refills | Status: DC

## 2017-11-18 NOTE — Assessment & Plan Note (Addendum)
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 Will defer elastography at this time with concordant serum analysis. Will need to repeat her Hep C VL per Medicaid requirements - she will come in next week for this lab in addition to repeating her Hep B sAg/cAb.  Will look for assistance to use Mavyret x 8 weeks. She met with Cassie today and filled out required paperwork for Medicaid. She will return in 4 weeks following starting her medications.  
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
## 2017-11-18 NOTE — Assessment & Plan Note (Signed)
Check serum factor V mutation with father (+) at 4week Hep C VL check.
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
## 2017-11-18 NOTE — Assessment & Plan Note (Signed)
She has done very well on her Biktarvy and Evotaz regimen and achieved viral suppression for the first time in years. I congratulated her on this today. Will stop Evotaz today as she only had a history of M184V mutation that the [place] alone can handle. It also interacts with Mavyret, Medicaid's preferred Hep C treatment. Will check CD4 with next lab draw - refilled her dapsone and Biktarvy today.  She will return in 3 months for an office visit. Will check HIV VL at the 4week Hep C VL check.

## 2017-11-18 NOTE — Patient Instructions (Addendum)
Stop your EVOTAZ.  Continue your BIKTARVY  Please pick up the Dapsone again and start taking this once a day until you CD4 is more than 200 for 3 months.   To treat your heatitis C will start you on a new medication called Mavyret:  - Mavyret - you will need to take 3 pills once a day for 8 weeks.  - Don't miss any pills! Each missed pill makes the chance of cure decrease  - Will need to check blood work for you 4 weeks after starting your Hepatitis C medication.  - OK to take with the Biktarvy and Dapsone - Do not take any herbal supplements while you are on this medication  Please follow up with our pharmacy team 4 weeks after you start your medication. You will need to do lab work at this appointment.

## 2017-11-18 NOTE — Progress Notes (Signed)
Declines Flu vaccine 

## 2017-11-18 NOTE — Assessment & Plan Note (Signed)
We discussed a reasonable plan to treat her acute surgical pain and history of drug abuse. I will give her a one time 4 day supply of hydrocodone 5/325 mg. I asked her to not combine this with other tylenol. NCCRS reviewed - she had a fill from her dentist on initial surgery date. I asked her to finish her antibiotic and if pain in neck does not subside soon or she develops further fevers to please call and let me know so we can get her seen by our dental clinic asap - she is not comfortable to seek further care with the dentist.

## 2017-12-13 ENCOUNTER — Encounter: Payer: Self-pay | Admitting: General Practice

## 2017-12-13 ENCOUNTER — Encounter: Payer: Self-pay | Admitting: Family Medicine

## 2017-12-13 ENCOUNTER — Encounter: Payer: Medicaid Other | Admitting: Family Medicine

## 2017-12-13 NOTE — Progress Notes (Unsigned)
Patient no showed for new gyn appt- patient's referring office will be notified per Dr Shawnie Pons

## 2017-12-13 NOTE — Progress Notes (Signed)
Patient did not keep appointment today. She may call to reschedule.  

## 2017-12-16 ENCOUNTER — Other Ambulatory Visit: Payer: Medicaid Other

## 2017-12-16 ENCOUNTER — Ambulatory Visit: Payer: Medicaid Other

## 2017-12-17 ENCOUNTER — Other Ambulatory Visit: Payer: Self-pay | Admitting: Infectious Diseases

## 2017-12-17 DIAGNOSIS — Z832 Family history of diseases of the blood and blood-forming organs and certain disorders involving the immune mechanism: Secondary | ICD-10-CM

## 2017-12-17 DIAGNOSIS — R0602 Shortness of breath: Secondary | ICD-10-CM

## 2017-12-17 DIAGNOSIS — B182 Chronic viral hepatitis C: Secondary | ICD-10-CM

## 2017-12-17 DIAGNOSIS — B20 Human immunodeficiency virus [HIV] disease: Secondary | ICD-10-CM

## 2017-12-17 MED ORDER — ALBUTEROL SULFATE HFA 108 (90 BASE) MCG/ACT IN AERS: 2.0000 | INHALATION_SPRAY | Freq: Four times a day (QID) | RESPIRATORY_TRACT | 2 refills | Status: DC | PRN

## 2017-12-21 ENCOUNTER — Other Ambulatory Visit: Payer: Medicaid Other

## 2017-12-21 DIAGNOSIS — B20 Human immunodeficiency virus [HIV] disease: Secondary | ICD-10-CM

## 2017-12-21 DIAGNOSIS — Z832 Family history of diseases of the blood and blood-forming organs and certain disorders involving the immune mechanism: Secondary | ICD-10-CM

## 2017-12-21 DIAGNOSIS — B182 Chronic viral hepatitis C: Secondary | ICD-10-CM

## 2017-12-22 LAB — T-HELPER CELL (CD4) - (RCID CLINIC ONLY)
CD4 T CELL HELPER: 24 % — AB (ref 33–55)
CD4 T Cell Abs: 320 /uL — ABNORMAL LOW (ref 400–2700)

## 2017-12-23 LAB — HEPATITIS C RNA QUANTITATIVE
HCV QUANT LOG: 6.8 {Log_IU}/mL — AB
HCV RNA, PCR, QN: 6330000 IU/mL — ABNORMAL HIGH

## 2017-12-23 LAB — HIV-1 RNA QUANT-NO REFLEX-BLD
HIV 1 RNA Quant: <20 copies/mL — AB
HIV-1 RNA Quant, Log: <1.3 Log copies/mL — AB

## 2017-12-23 LAB — HEPATITIS B CORE ANTIBODY, TOTAL: Hep B Core Total Ab: NONREACTIVE

## 2017-12-23 LAB — HEPATITIS B SURFACE ANTIGEN: Hepatitis B Surface Ag: NONREACTIVE

## 2017-12-24 ENCOUNTER — Other Ambulatory Visit: Payer: Self-pay | Admitting: Infectious Diseases

## 2017-12-24 DIAGNOSIS — R0602 Shortness of breath: Secondary | ICD-10-CM

## 2017-12-28 ENCOUNTER — Encounter: Payer: Self-pay | Admitting: Pharmacy Technician

## 2017-12-31 ENCOUNTER — Telehealth: Payer: Self-pay | Admitting: Pharmacist

## 2017-12-31 MED FILL — MAVYRET 100-40 MG TABS: 100-40 | 28 days supply | Qty: 84 | Fill #0

## 2017-12-31 NOTE — Telephone Encounter (Signed)
Patient is approved to receive Mavyret x 8 weeks for chronic Hepatitis C infection. Counseled patient to take all three tablets of Mavyret daily with food.  Counseled patient the need to take all three tablets together and to not separate them out during the day. Encouraged patient not to miss any doses and explained how their chance of cure could go down with each dose missed. Counseled patient on what to do if dose is missed - if it is closer to the missed dose take immediately; if closer to next dose then skip dose and take the next dose at the usual time. Counseled patient on common symptoms including headache, fatigue, and nausea and that the symptoms normally decrease with time. I reviewed patient medications and found no drug interactions. Discussed with patient that there are several drug interactions with Mavyret and instructed patient to call the clinic if she wishes to start a new medication during course of therapy. Also advised patient to call if she experiences any side effects. Follow up with Judeth CornfieldStephanie on 1/17 for one month follow up.

## 2018-01-31 ENCOUNTER — Telehealth: Payer: Self-pay | Admitting: Infectious Diseases

## 2018-01-31 DIAGNOSIS — J189 Pneumonia, unspecified organism: Secondary | ICD-10-CM

## 2018-01-31 DIAGNOSIS — B20 Human immunodeficiency virus [HIV] disease: Secondary | ICD-10-CM

## 2018-01-31 NOTE — Telephone Encounter (Signed)
[facility] everyone at home has influenza b. She was seen in the ER 24h ago for rapid heart rate. Attempted to give her IVF but unsuccessful; left despite recommendation for admission because her kids are home and sick. She is feeling better and zofran has helped keep medications down. She is on tamiflu and apparently on 2 antibiotics (cefpodoxime and doxycycline). Advised to take antibiotics with food to help GI side effects.   Will need to reschedule her appointment coming up with pharmacy as she has not yet taken the Mavyret due to the holidays - was nervous with the schedule changes at home that it would cause her to miss a pill so was waiting for kids to return to school. Will update our pharmacy team about this. She will send MYChart once she confirms her and her partner's start date for Mavyret. They will follow up with Pharmacy team 4 weeks later.    Will place order for CT of chest - she has had frequent pneumonias and is non-smoker. Need to evaluate further; she does have fatigue and productive sputum. Possible she has NTM infection. Will see what her CT scan shows and refer to pulmonology for consideration of possible bronchoscopy for evaluation.

## 2018-02-07 MED FILL — MAVYRET 100-40 MG TABS: 100-40 | 28 days supply | Qty: 84 | Fill #1

## 2018-02-15 ENCOUNTER — Other Ambulatory Visit: Payer: Self-pay | Admitting: Infectious Diseases

## 2018-02-15 DIAGNOSIS — B20 Human immunodeficiency virus [HIV] disease: Secondary | ICD-10-CM

## 2018-02-15 MED ORDER — MALATHION 0.5 % EX LOTN: TOPICAL_LOTION | Freq: Once | CUTANEOUS | 1 refills | Status: AC

## 2018-02-18 ENCOUNTER — Ambulatory Visit: Payer: Medicaid Other

## 2018-02-18 ENCOUNTER — Ambulatory Visit: Payer: Medicaid Other | Admitting: Infectious Diseases

## 2018-03-11 ENCOUNTER — Ambulatory Visit: Payer: Medicaid Other | Admitting: Infectious Diseases

## 2018-03-14 ENCOUNTER — Telehealth: Payer: Self-pay

## 2018-03-14 ENCOUNTER — Ambulatory Visit (INDEPENDENT_AMBULATORY_CARE_PROVIDER_SITE_OTHER): Payer: Medicaid Other | Admitting: Infectious Diseases

## 2018-03-14 VITALS — BP 102/71 | HR 93 | Temp 99.3°F | Wt 119.0 lb

## 2018-03-14 DIAGNOSIS — R05 Cough: Secondary | ICD-10-CM

## 2018-03-14 DIAGNOSIS — Z21 Asymptomatic human immunodeficiency virus [HIV] infection status: Secondary | ICD-10-CM | POA: Diagnosis not present

## 2018-03-14 DIAGNOSIS — B182 Chronic viral hepatitis C: Secondary | ICD-10-CM

## 2018-03-14 DIAGNOSIS — E440 Moderate protein-calorie malnutrition: Secondary | ICD-10-CM

## 2018-03-14 DIAGNOSIS — R059 Cough, unspecified: Secondary | ICD-10-CM

## 2018-03-14 MED ORDER — MALATHION 0.5 % EX LOTN: TOPICAL_LOTION | Freq: Once | CUTANEOUS | 0 refills | Status: AC
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
## 2018-03-14 NOTE — Assessment & Plan Note (Signed)
She has had serial positive chest x rays for ongoing infiltrate w/o evidence of clearance. She has fatigue/activity intolerance and shortness of breath with most activities at home. Will likely need another PA for CT scan w/ contrast to evaluate her non-resolving infiltrate and cough.  Referral to pulmonology once we get results of scan. She does have an abnormal lung exam on the RML/RLL with crackles/rhonchi.

## 2018-03-14 NOTE — Patient Instructions (Signed)
Please continue your Biktarvy everyday as you have been doing. Your labs look so much better!  No need for dapsone anymore.   Please send a MyChart to me when you and [name] start Mavyret.   Will see you back 4weeks with pharmacy after you take your first pill. Will need to do labs at this visit so make sure you come prepared.    ABOUT HEPATITIS C VIRUS:   Chronic Hepatitis C is the leading cause of cirrhosis, liver cancer, and end stage liver disease requiring transplantation when this infection goes untreated for many years  There are not many specific symptoms of early infection and often goes unrecognized until specific blood test is ordered  The hepatitis c virus is passed primarily through direct exposure of contaminated blood or body fluids -   Risk for sexual transmission is very low but is possible if there is high frequency of unprotected sexual activity with known hepatitis c partner or multiple partners of known status.  Advanced cirrhosis occurs in approximately 10-20% of those with chronic infection.  Approximately 15-25% clear spontaneously (usually in the first 6 months of becoming exposed to virus)  Newer medications provide over 90% cure rate when taken as prescribed  IN GENERAL ABOUT DIET  . Persons living with chronic hepatitis c infection should have a balanced diet and choose nutritious foods from each major food group to maintain a healthy weight and avoid nutritional deficiencies.  .  . Patients with cirrhosis should not have protein restriction; we recommend a protein intake of approximately 1.2-1.5 g/kg/day.  . For patients with cirrhosis and hepatic encephalopathy, the American Association for the Study of Liver Diseases (AASLD) recommended protein intake is 1.2-1.5 g/kg/day.[82] . If you experience ascites please limit sodium intake to < 2000 mg a day   UNTIL YOU HAVE BEEN TREATED AND CURED:  . Use condoms with sexual encounters or abstinence . No  sharing of razors, toothbrushes, nail clippers or anything that could potentially have blood on it.  . If you cut yourself and blood spills onto item/surface please clean with 1:10 bleach solution and allow to dry, EVEN if it is dried blood.  . Limit alcohol to as little as possible to less than 1 drink a day - this is very irritating to your liver. . Limit tylenol use to less than 2,000 mg daily (two extra strength tablets only twice a day) . If you have cirrhosis of the liver please take no more than 1,000 mg tylenol a day   GENERAL HELPFUL HINTS ON HCV THERAPY: 1. Stay well-hydrated. 2. Notify the [facility] Clinic of any changes in your other over-the-counter/herbal or prescription medications. 3. If you miss a dose of your medication, take the missed dose as soon as you remember. Return to your regular time/dose schedule the next day.  4.  Do not stop taking your medications without first talking with your healthcare provider. 5.  You will see our pharmacist-specialist within the first 2 weeks of starting your medication to monitor for any possible side effects. 6.  You will have blood work once during treatment 4 weeks after your first pill. Again soon after treatment is completed and one final lab 3 months after your last pill to ensure cure!   TIPS TO BE SUCCESSFUL WITH DAILY MEDICATION USE: 1. Set a reminder on your phone  2. Try filling out a pill box for the week - pick a day and put one pill for every day during the week  so you know right away if you missed a pill.  3. Have a trusted family member ask you about your medications.  4. Scientist, research (medical) Instructions:  1. Take Mavyret, three tablets (at the same time) daily with food. Please take ALL THREE PILLS AT ONCE. You should take it at approximately the same time every day. Treatment will be for 8 weeks. Do not miss a dose.    2. Do not run out of Mavyret! If you are down to one week of medication left and have not heard  about your next shipment, please let us know as soon as possible. You will be given 28 days of treatment at a time and will receive one refill.   3. If you need to start a new medication, prescription from your doctor or over the counter medication, you need to contact us to make sure it does not interfere with Mavyret. There are several medications that can interfere with Mavyret and can make you sick or make the medication not work.  4. If you need to take a medication for acid reflux, you can take omeprazole 20mg  daily.     5. Tylenol (acetominophen) and Advil (ibuprofen) are safe to take with Harvoni if needed for headache, fever, pain.   6. IF YOU ARE ON BIRTH CONTROL PILLS, YOU RECEIVE A SHOT FOR BIRTH CONTROL, OR YOU HAVE AN IUD, please notify your provider to make sure it is safe with Mavyret.   7. DO NOT stop Mavyret unless instructed to by your provider. If you are hospitalized while taking this medication please bring it with you to the hospital to avoid interruption of therapy. Every pill is important!  8. The most common side effects associated with Mavyret include:  o Fatigue o Headache o Nausea o Diarrhea o Insomnia   1. Tylenol (acetominophen) and Advil (ibuprofen) are safe to take with Harvoni if needed for headache, fever, pain.   2. DO NOT stop Harvoni unless instructed to by your provider. If you are hospitalized while taking this medication please bring it with you to the hospital to avoid interruption of therapy. Every pill is important!  3. The most common side effects associated with Harvoni include:  o Fatigue o Headache o Nausea o Diarrhea o Insomnia

## 2018-03-14 NOTE — Assessment & Plan Note (Signed)
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 She can stop dapsone therapy for prophylaxis. Will repeat labs for her at her upcoming visit with Hep C as she is a very difficult stick. I congratulated her on her adherence with her Biktarvy.  She will return to see pharmacy for a visit 4 weeks after starting her Mavyret.  Check HIV VL and CD4 at this visit please.

## 2018-03-14 NOTE — Assessment & Plan Note (Signed)
Has not yet started mavyret - she and her husband plan on starting in the next 2 weeks - will send MYChart with start date and to schedule pharmacy follow up. Re-counseled today on medication.

## 2018-03-14 NOTE — Telephone Encounter (Signed)
Case #: [id number] [employer]: [phone] CT chest w/ Contrast is currently under clinical review. Office will be notified with decision in 24-48 hours.

## 2018-03-14 NOTE — Assessment & Plan Note (Signed)
She has regained 13 pounds in the last 8 weeks being back on therapy consistently. She looks much healthier.

## 2018-03-24 ENCOUNTER — Telehealth: Payer: Self-pay | Admitting: *Deleted

## 2018-03-24 NOTE — Telephone Encounter (Signed)
Per Angelique Blonder at [facility], patient has had 3 unsuccessful IV attempts and is refusing another stick for IV contrast to complete the CT Chest with contrast.  OK per Judeth Cornfield for this to be done without contrast.  Claris Che updated [employer] with situation, acquired authorization to proceed with CT Chest without contrast, same authorization number. Written order faxed to [phone]. Andree Coss, RN

## 2018-03-28 ENCOUNTER — Other Ambulatory Visit: Payer: Self-pay | Admitting: Infectious Diseases

## 2018-03-28 DIAGNOSIS — J479 Bronchiectasis, uncomplicated: Secondary | ICD-10-CM

## 2018-03-28 DIAGNOSIS — J189 Pneumonia, unspecified organism: Secondary | ICD-10-CM

## 2018-03-28 NOTE — Progress Notes (Signed)
CT Scan finding from outside facility with findings of severe bronchiectasis and concern for mycobacterial infection. Referral to pulmonology for consideration of bronchoscopy and further work up/management.

## 2018-04-01 DIAGNOSIS — Z832 Family history of diseases of the blood and blood-forming organs and certain disorders involving the immune mechanism: Secondary | ICD-10-CM

## 2018-04-01 DIAGNOSIS — Z0000 Encounter for general adult medical examination without abnormal findings: Secondary | ICD-10-CM

## 2018-04-07 ENCOUNTER — Institutional Professional Consult (permissible substitution): Payer: Medicaid Other | Admitting: Emergency Medicine

## 2018-04-11 ENCOUNTER — Ambulatory Visit: Payer: Medicaid Other | Admitting: Infectious Diseases

## 2018-04-11 ENCOUNTER — Ambulatory Visit: Payer: Medicaid Other | Admitting: Pharmacist

## 2018-04-12 ENCOUNTER — Encounter: Payer: Self-pay | Admitting: Internal Medicine

## 2018-04-12 ENCOUNTER — Ambulatory Visit (INDEPENDENT_AMBULATORY_CARE_PROVIDER_SITE_OTHER): Payer: Medicaid Other | Admitting: Internal Medicine

## 2018-04-12 VITALS — BP 104/70 | HR 86 | Ht 63.0 in | Wt 121.8 lb

## 2018-04-12 DIAGNOSIS — J479 Bronchiectasis, uncomplicated: Secondary | ICD-10-CM | POA: Diagnosis not present

## 2018-04-12 DIAGNOSIS — R0609 Other forms of dyspnea: Secondary | ICD-10-CM | POA: Diagnosis not present

## 2018-04-12 DIAGNOSIS — R0602 Shortness of breath: Secondary | ICD-10-CM | POA: Insufficient documentation

## 2018-04-12 NOTE — Assessment & Plan Note (Addendum)
See Ct chest 03/05/2018 - 04/12/2018 spirometry attemped but f/u loop not physiologic  Reviewed pathophysiology with pt using escalator analogy   rec Breathe clean air  mucinex prn  saba prn  abx per ID/HIV clinic but ok to return here prn flare  F/u with full pfts in 4-6 weeks    Total time devoted to counseling  > 50 % of initial 60 min office visit:  review case with pt/directly observe walking 02 sat study/  discussion of options/alternatives/ personally creating written customized instructions  in presence of pt  then going over those specific  Instructions directly with the pt including how to use all of the meds but in particular covering each new medication in detail and the difference between the maintenance= "automatic" meds and the prns using an action plan format for the latter (If this problem/symptom => do that organization reading Left to right).  Please see AVS from this visit for a full list of these instructions which I personally wrote for this pt and  are unique to this visit.

## 2018-04-12 NOTE — Patient Instructions (Addendum)
Bronchiectasis =   you have scarring of your bronchial tubes which means that they don't function perfectly normally and mucus tends to pool in certain areas of your lung which can cause pneumonia and further scarring of your lung and bronchial tubes - think of broken escalators in a department store and you can imagine the crowd that would accumulate at the bottom if they only worked partially.  Whenever you develop cough congestion take mucinex or mucinex dm(1200 mg every 12 hours as needed)  > these will help keep the mucus loose and flowing but if your condition worsens you need to seek help immediately preferably here or somewhere inside the [facility] system to compare xrays ( worse = darker or bloody mucus or pain on breathing in)     I will let the infection specialists guide you on what antibiotic can be taken for flares  Only use your albuterol as a rescue medication to be used if you can't catch your breath by resting or doing a relaxed purse lip breathing pattern.  - The less you use it, the better it will work when you need it. - Ok to use up to 2 puffs  every 4 hours if you must but call for immediate appointment if use goes up over your usual need - Don't leave home without it !!  (think of it like the spare tire for your car)   Please schedule a follow up office visit in 4 weeks, sooner if needed with pfts on return  - bring your medications with you on return
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
## 2018-04-12 NOTE — Assessment & Plan Note (Addendum)
Onset  Around 2018/ very sedentary  - 04/12/2018   Walked RA  3 laps @  approx 242ft each @ fast pace  stopped due to  End of study no desats   - 04/12/2018 spirometry attemped but f/u loop not physiologic  Symptoms are markedly disproportionate to objective findings and not clear to what extent this is actually a pulmonary  problem but pt does appear to have difficult to sort out respiratory symptoms of unknown origin for which  DDX  = almost all start with A and  include Adherence, Ace Inhibitors, Acid Reflux, Active Sinus Disease, Alpha 1 Antitripsin deficiency, Anxiety masquerading as Airways dz,  ABPA,  Allergy(esp in young), Aspiration (esp in elderly), Adverse effects of meds,  Active smoking or Vaping, A bunch of PE's/clot burden (a few small clots can't cause this syndrome unless there is already severe underlying pulm or vascular dz with poor reserve),  Anemia or thyroid disorder, plus two Bs  = Bronchiectasis and Beta blocker use..and one C= CHF     Adherence is always the initial "prime suspect" and is a multilayered concern that requires a "trust but verify" approach in every patient - starting with knowing how to use medications, especially inhalers, correctly, keeping up with refills and understanding the fundamental difference between maintenance and prns vs those medications only taken for a very short course and then stopped and not refilled.  - return with all meds in hand using a trust but verify approach to confirm accurate Medication  Reconciliation The principal here is that until we are certain that the  patients are doing what we've asked, it makes no sense to ask them to do more.    ? Anxiety/depression/ deconditioning > usually at the bottom of this list of usual suspects but should be much higher on this pt's based on H and P and   may interfere with adherence and also interpretation of response or lack thereof to symptom management which can be quite subjective.   ? Anemia/  thyroid dz > presumably excluded as being followed in HIV clinic with regular blood draws  ? Asthma/allergy > nothing to suggest  ? A bunch of PEs > not suggested by walking study but at risk as faterh with [facility] leiden > rec several times but never checked for this pt   ? Bronchiectasis (see separate a/p)

## 2018-04-14 ENCOUNTER — Ambulatory Visit: Payer: Medicaid Other | Admitting: Pharmacist

## 2018-04-14 ENCOUNTER — Ambulatory Visit: Payer: Medicaid Other | Admitting: Infectious Diseases

## 2018-05-03 ENCOUNTER — Telehealth: Payer: Self-pay

## 2018-05-03 NOTE — Telephone Encounter (Signed)
Initiated PA for Malathion 0.5% lotion. PA is under review and outcome should be backed to office within the next 24 hours. PA# [id number] Plan: [phone] Lorenso Courier, CMA

## 2018-05-04 NOTE — Telephone Encounter (Signed)
Please call [name] and offer her the preferred alternatives to see if she has previously used them in the past.  Thank you.

## 2018-05-04 NOTE — Telephone Encounter (Signed)
PA was denied by plan. Patient must have tried preferred medication before plan can cover non-formulary medication. Preferred alternative is Natroba topical solution, Sklice lotion, Permethrin cream. Will route message to [employer], Np to advise on next steps. Lorenso Courier, [place]

## 2018-05-05 NOTE — Telephone Encounter (Signed)
Attempted to call patient regarding PA denial. Will need to see if patient has tried/ failed preferred medication.  Unable to reach patient at this time; nor leave voicemail. Will try again later. Lorenso Courier, [place]

## 2018-05-06 ENCOUNTER — Ambulatory Visit: Payer: Medicaid Other | Admitting: Pharmacist

## 2018-05-06 ENCOUNTER — Encounter: Payer: Self-pay | Admitting: Internal Medicine

## 2018-05-06 ENCOUNTER — Ambulatory Visit: Payer: Medicaid Other | Admitting: Infectious Diseases

## 2018-05-20 ENCOUNTER — Ambulatory Visit: Payer: Medicaid Other | Admitting: Internal Medicine

## 2018-05-25 ENCOUNTER — Other Ambulatory Visit: Payer: Self-pay | Admitting: Infectious Diseases

## 2018-05-25 DIAGNOSIS — B20 Human immunodeficiency virus [HIV] disease: Secondary | ICD-10-CM

## 2018-06-01 ENCOUNTER — Encounter: Payer: Self-pay | Admitting: Pharmacist

## 2018-06-06 ENCOUNTER — Ambulatory Visit: Payer: Medicaid Other | Admitting: Infectious Diseases

## 2018-06-06 ENCOUNTER — Ambulatory Visit: Payer: Medicaid Other | Admitting: Pharmacist

## 2018-06-23 ENCOUNTER — Ambulatory Visit: Payer: Medicaid Other | Admitting: Pharmacist

## 2018-07-01 ENCOUNTER — Other Ambulatory Visit: Payer: Self-pay | Admitting: Infectious Diseases

## 2018-07-01 DIAGNOSIS — B20 Human immunodeficiency virus [HIV] disease: Secondary | ICD-10-CM

## 2018-07-04 ENCOUNTER — Encounter: Payer: Self-pay | Admitting: Infectious Diseases

## 2018-07-04 ENCOUNTER — Ambulatory Visit (INDEPENDENT_AMBULATORY_CARE_PROVIDER_SITE_OTHER): Payer: Medicaid Other | Admitting: Infectious Diseases

## 2018-07-04 ENCOUNTER — Other Ambulatory Visit: Payer: Self-pay

## 2018-07-04 DIAGNOSIS — Z0000 Encounter for general adult medical examination without abnormal findings: Secondary | ICD-10-CM

## 2018-07-04 DIAGNOSIS — F1121 Opioid dependence, in remission: Secondary | ICD-10-CM | POA: Diagnosis not present

## 2018-07-04 DIAGNOSIS — Z21 Asymptomatic human immunodeficiency virus [HIV] infection status: Secondary | ICD-10-CM | POA: Diagnosis not present

## 2018-07-04 DIAGNOSIS — J479 Bronchiectasis, uncomplicated: Secondary | ICD-10-CM | POA: Diagnosis not present

## 2018-07-04 DIAGNOSIS — B182 Chronic viral hepatitis C: Secondary | ICD-10-CM | POA: Diagnosis not present

## 2018-07-04 MED ORDER — BICTEGRAVIR-EMTRICITAB-TENOFOV 50-200-25 MG PO TABS: 1.0000 | ORAL_TABLET | Freq: Every day | ORAL | 11 refills | Status: DC

## 2018-07-04 MED ORDER — ALBUTEROL SULFATE HFA 108 (90 BASE) MCG/ACT IN AERS: 2.0000 | INHALATION_SPRAY | Freq: Four times a day (QID) | RESPIRATORY_TRACT | 6 refills | Status: DC | PRN
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
## 2018-07-06 NOTE — Assessment & Plan Note (Signed)
Nearly halfway through her treatment with mavyret. She is taking this correctly and her adherence sounds to be excellent. She is not troubled by any side effects from the medication. I reviewed briefly the natural pathogenesis of hepatitis c, projected cure with perfect adherence being 98% and counseled that she can re-infect in the future should she engage in at risk behaviors. She understands and is doing very well on suboxone replacement with support groups and has been injection free from several years.

## 2018-07-06 NOTE — Patient Instructions (Signed)
It was very nice to talk to you on the phone today.  I am glad to hear that you are staying well and keeping healthy.    It sounds like you are doing a great job taking your mavyret and biktarvy - keep up the good work!   Refills Sent in Today:  Albuterol   Vaccines Recommended:   Only the ones you will let me give you :) can talk in the future if you would like   Recommended Next Office Visit:   July as we have currently scheduled - please have [name] call to set up a Telephone visit with Cassie our pharmacist to check in with his Hep C treatment. We have him scheduled for a visit with me the same day you are coming in July.   Please continue to be well, stay away from folks that are sick, wash her hands frequently, do not touch her face or mouth and continue to take your medications.

## 2018-07-06 NOTE — Assessment & Plan Note (Signed)
Continue her suboxone therapy per her provider in [facility].

## 2018-07-06 NOTE — Assessment & Plan Note (Signed)
She has been doing very well with adherence with Biktarvy. She has had a big improvement in how well she feels, has regained weight and has noticed significantly less episodes of illness/pneumonias since she has been consistent with her medications.  Will check VL and CD4 at upcoming appointment.

## 2018-07-06 NOTE — Assessment & Plan Note (Signed)
Following with Dr. Sherene Sires - will refill her albuterol as requested.  Awaiting Spirometry evaluation.

## 2018-07-06 NOTE — Assessment & Plan Note (Signed)
Screen with TSH/T4/T3 next appointment

## 2018-08-24 ENCOUNTER — Ambulatory Visit: Payer: Medicaid Other | Admitting: Infectious Diseases

## 2018-08-24 ENCOUNTER — Other Ambulatory Visit: Payer: Self-pay

## 2018-10-18 ENCOUNTER — Ambulatory Visit: Payer: Medicaid Other | Admitting: Infectious Diseases

## 2018-11-12 ENCOUNTER — Other Ambulatory Visit: Payer: Self-pay | Admitting: Infectious Diseases

## 2018-11-12 DIAGNOSIS — Z21 Asymptomatic human immunodeficiency virus [HIV] infection status: Secondary | ICD-10-CM

## 2018-11-22 ENCOUNTER — Ambulatory Visit (INDEPENDENT_AMBULATORY_CARE_PROVIDER_SITE_OTHER): Payer: Medicaid Other | Admitting: Infectious Diseases

## 2018-11-22 ENCOUNTER — Encounter: Payer: Self-pay | Admitting: Infectious Diseases

## 2018-11-22 ENCOUNTER — Other Ambulatory Visit: Payer: Self-pay

## 2018-11-22 VITALS — BP 120/82 | HR 79 | Wt 127.0 lb

## 2018-11-22 DIAGNOSIS — Z832 Family history of diseases of the blood and blood-forming organs and certain disorders involving the immune mechanism: Secondary | ICD-10-CM | POA: Insufficient documentation

## 2018-11-22 DIAGNOSIS — Z0000 Encounter for general adult medical examination without abnormal findings: Secondary | ICD-10-CM

## 2018-11-22 DIAGNOSIS — G43909 Migraine, unspecified, not intractable, without status migrainosus: Secondary | ICD-10-CM | POA: Insufficient documentation

## 2018-11-22 DIAGNOSIS — J479 Bronchiectasis, uncomplicated: Secondary | ICD-10-CM

## 2018-11-22 DIAGNOSIS — B182 Chronic viral hepatitis C: Secondary | ICD-10-CM

## 2018-11-22 DIAGNOSIS — Z21 Asymptomatic human immunodeficiency virus [HIV] infection status: Secondary | ICD-10-CM

## 2018-11-22 DIAGNOSIS — G43809 Other migraine, not intractable, without status migrainosus: Secondary | ICD-10-CM

## 2018-11-22 MED ORDER — BIKTARVY 50-200-25 MG PO TABS: 1.0000 | ORAL_TABLET | Freq: Every day | ORAL | 11 refills | Status: DC
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
## 2018-11-22 NOTE — Assessment & Plan Note (Signed)
Cough has resolved seems very stable on her inhaler.  She follows with Dr. Melvyn Novas at [facility] pulmonology.

## 2018-11-22 NOTE — Assessment & Plan Note (Signed)
Refused flu and pneumonia vaccines.  We will screen with thyroid panel for disease per her request given her history of palpitations.

## 2018-11-22 NOTE — Assessment & Plan Note (Signed)
Her CD4 count had recovered nicely.  November 2019 was up above 300.  She is regained weight and no longer looks under nourished.  She has had a significant improvement in many of her health problems and outside of current stressors and grief reaction she is doing very well.  We will update her viral load and CD4 count today as this was not done in the ER visit when she requested a lot of labs to be drawn. We will refill her Biktarvy. Return for follow-up care in 4 months. She will need a Pap smear at some time, history of vaginismus.  May be best to refer her to gynecology for management.

## 2018-11-22 NOTE — Assessment & Plan Note (Signed)
We will screen today per her request.

## 2018-11-22 NOTE — Patient Instructions (Addendum)
This was the Neurology office that I see in the system.  Neurology - 4th fl [facility] [place], [place] [address]  [phone]   If you have another one in mind tell me where to route that referral.   Please continue your Biktarvy as you are.   Will update your labs today and have you back in 4 months to check final cure labs for hepatitis C.

## 2018-11-22 NOTE — Assessment & Plan Note (Signed)
I worry about complicated migraines given her description of her illnesses recently all in the setting of headache.  We will work on trying to get her evaluated by neurology.  She had an MRI of the head without contrast in June 2019 that was normal aside from some mild sinusitis.  Will defer further imaging to neurology team.  She had a CT scan at her ER visit at [facility].  Working on obtaining records.

## 2018-11-22 NOTE — Progress Notes (Signed)
Call [facility] and request most recent labs to be faxed to [place]. Spoke with Sharyn Lull. She will fax labs drawn from last ED visit. Medical release of information faxed.  Eugenia Mcalpine

## 2018-11-22 NOTE — Assessment & Plan Note (Signed)
Genotype 1a, fibrosis score F0.  She has completed her Mavyret x8 weeks with sounds to be nearly perfect adherence.  Fortunately this did not make her migraines any worse.  We will check end of treatment hep C RNA today and again 12 weeks from now since I am uncertain about the exact date of her completion.  Based on my records it is likely that she is nearly 12 weeks out now.  Normal liver function tests at recent ER visit.

## 2018-11-23 LAB — T-HELPER CELL (CD4) - (RCID CLINIC ONLY)
CD4 % Helper T Cell: 26 % — ABNORMAL LOW (ref 33–65)
CD4 T Cell Abs: 372 /uL — ABNORMAL LOW (ref 400–1790)

## 2018-11-26 LAB — THYROID PANEL WITH TSH
Free Thyroxine Index: 2.3 (ref 1.4–3.8)
T3 Uptake: 27 % (ref 22–35)
T4, Total: 8.5 ug/dL (ref 5.1–11.9)
TSH: 2.78 mIU/L

## 2018-11-26 LAB — HEPATITIS C RNA QUANTITATIVE
HCV Quantitative Log: <1.18 Log IU/mL
HCV RNA, PCR, QN: <15 IU/mL

## 2018-11-26 LAB — HIV-1 RNA QUANT-NO REFLEX-BLD
HIV 1 RNA Quant: <20 copies/mL
HIV-1 RNA Quant, Log: <1.3 Log copies/mL

## 2018-11-26 LAB — FACTOR 5 LEIDEN

## 2018-12-21 DIAGNOSIS — R519 Headache, unspecified: Secondary | ICD-10-CM

## 2018-12-21 DIAGNOSIS — D6851 Activated protein C resistance: Secondary | ICD-10-CM

## 2018-12-21 DIAGNOSIS — Z21 Asymptomatic human immunodeficiency virus [HIV] infection status: Secondary | ICD-10-CM

## 2018-12-21 DIAGNOSIS — R202 Paresthesia of skin: Secondary | ICD-10-CM

## 2018-12-22 ENCOUNTER — Telehealth: Payer: Self-pay | Admitting: Hematology

## 2018-12-22 NOTE — Telephone Encounter (Signed)
Received a new hem appt from Janene Madeira, NP for factor V leiden. Pt has been cld and scheduled to see Dr. Irene Limbo on 12/9 at 1pm. Pt aware to arrive 15 minutes early.

## 2019-01-11 ENCOUNTER — Encounter: Payer: Medicaid Other | Admitting: Hematology

## 2019-01-19 ENCOUNTER — Encounter: Payer: Self-pay | Admitting: Neurology

## 2019-01-22 NOTE — Progress Notes (Signed)
PATIENT NOT SEEN.  SHE DID NOT RESPOND TO MULTIPLE ATTEMPTS TO CONTACT HER FOR VIRTUAL VISIT.

## 2019-01-23 ENCOUNTER — Telehealth (INDEPENDENT_AMBULATORY_CARE_PROVIDER_SITE_OTHER): Payer: Medicaid Other | Admitting: Neurology

## 2019-01-23 ENCOUNTER — Encounter: Payer: Self-pay | Admitting: Neurology

## 2019-01-23 ENCOUNTER — Other Ambulatory Visit: Payer: Self-pay

## 2019-03-16 IMAGING — US ULTRASOUND LEFT BREAST LIMITED
1 series · 2 of 2 positions shown · non-contrast
Comparison: Previous exam(s).

CLINICAL DATA: 27-year-old female with bilateral palpable lumps and
associated tenderness for approximately 1 year.

EXAM:
ULTRASOUND OF THE BILATERAL BREAST

[Series 1: ultrasound left breast limited · 0.06mm/px · 2 of 2 slices shown]
[im 1/2]
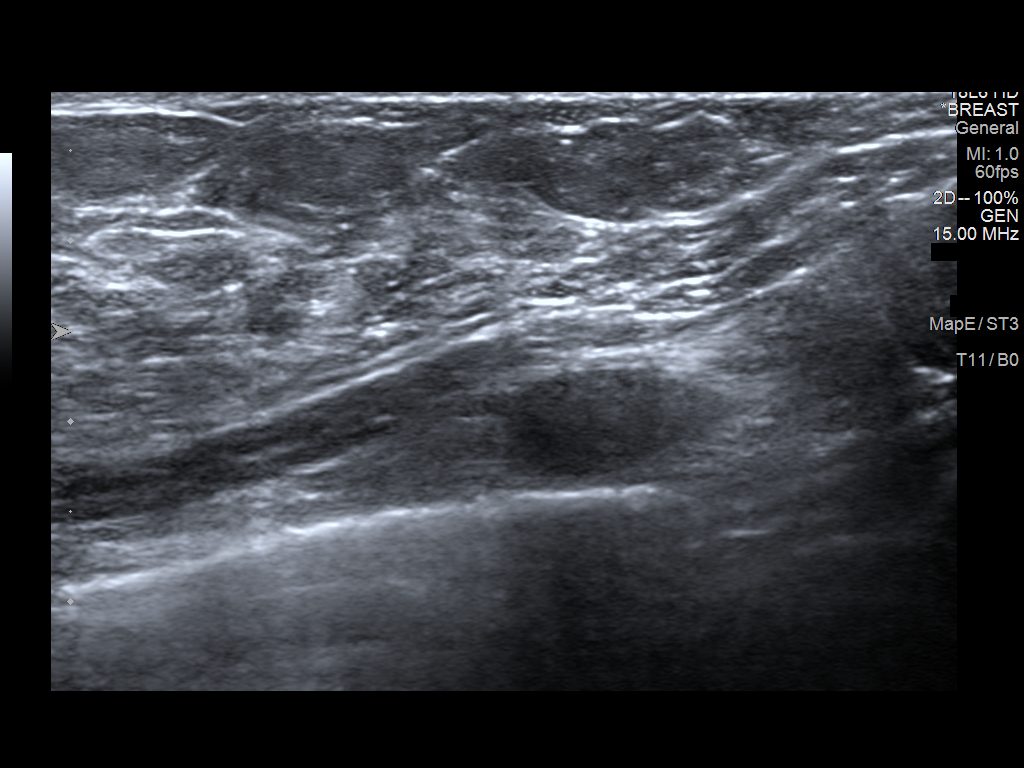
[im 2/2]
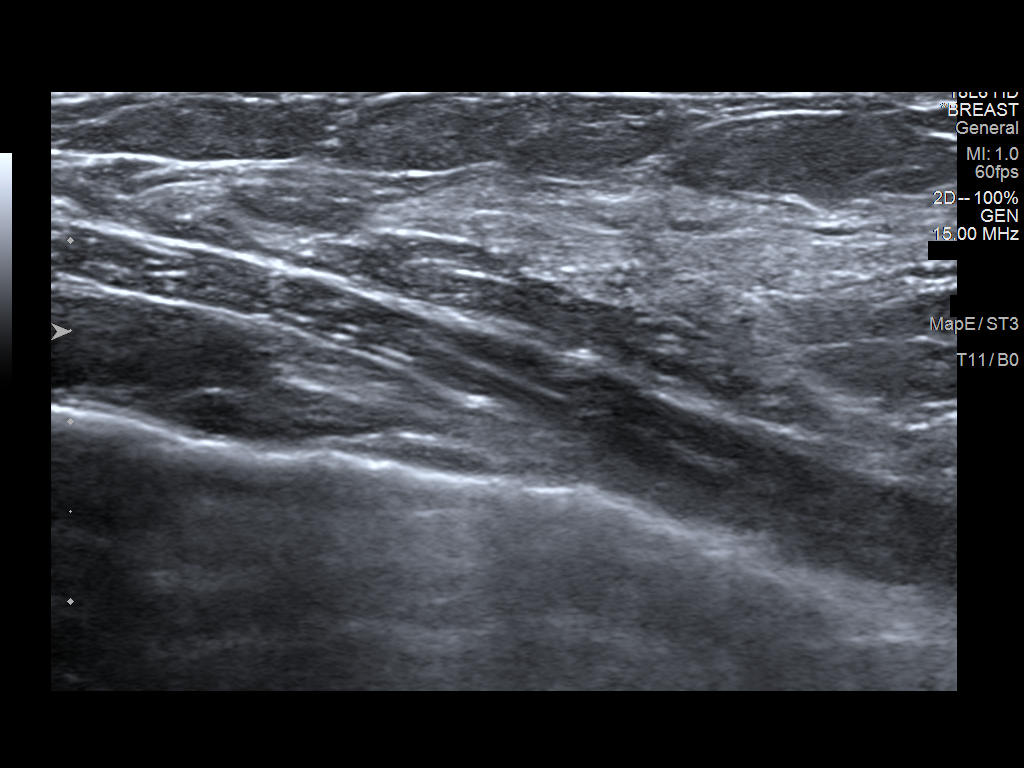

[2 of 2 positions shown; findings below may reference images not displayed]

FINDINGS: On physical exam, I palpate heterogenous fibroglandular tissue
without focal or suspicious lumps in the bilateral breasts.

Targeted ultrasound is performed, showing normal fibroglandular
tissue without suspicious sonographic abnormalities. Evaluation of
the bilateral breasts at the site of the patient's clinical symptoms
was performed.
IMPRESSION: No sonographic evidence of malignancy.

RECOMMENDATION:
1. Clinical follow-up recommended for the palpable areas of concern
in the bilateral breasts. Any further workup should be based on
clinical grounds.
2. Screening mammogram at age 39 unless there are persistent or
intervening clinical concerns. (Code:[id number])

I have discussed the findings and recommendations with the patient.
Results were also provided in writing at the conclusion of the
visit. If applicable, a reminder letter will be sent to the patient
regarding the next appointment.

BI-RADS CATEGORY  1: Negative.

## 2019-04-06 DIAGNOSIS — R42 Dizziness and giddiness: Secondary | ICD-10-CM

## 2019-04-06 DIAGNOSIS — G43809 Other migraine, not intractable, without status migrainosus: Secondary | ICD-10-CM

## 2019-04-10 ENCOUNTER — Encounter: Payer: Self-pay | Admitting: Neurology

## 2019-04-10 ENCOUNTER — Ambulatory Visit: Payer: Medicaid Other | Admitting: Infectious Diseases

## 2019-05-17 ENCOUNTER — Ambulatory Visit: Payer: Medicaid Other | Admitting: Infectious Diseases
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
## 2019-06-01 ENCOUNTER — Ambulatory Visit: Payer: Medicaid Other | Admitting: Neurology

## 2019-12-14 NOTE — Telephone Encounter (Signed)
Received message from Penelope. She was unable to connect with [facility] by phone this week, asked triage to follow up.  RN called [name], no answer and call went to voicemail that is full. Per Judeth Cornfield, patient needs to have office visit in person soon with labs the same day. Triage to follow. Andree Coss, RN

## 2019-12-18 NOTE — Telephone Encounter (Signed)
Attepted to reach patient again today to schedule an appointment with Rexene Alberts, NP and labs same day. Patient did not answer and her mailbox is full. Will send patient a mychart message to call the office to schedule appointment.  Valarie Cones

## 2020-03-14 ENCOUNTER — Encounter: Payer: Self-pay | Admitting: Infectious Diseases

## 2020-03-14 ENCOUNTER — Ambulatory Visit (INDEPENDENT_AMBULATORY_CARE_PROVIDER_SITE_OTHER): Payer: Medicaid Other | Admitting: Infectious Diseases

## 2020-03-14 ENCOUNTER — Other Ambulatory Visit: Payer: Self-pay

## 2020-03-14 VITALS — BP 106/75 | HR 91 | Temp 97.2°F | Wt 124.0 lb

## 2020-03-14 DIAGNOSIS — Z21 Asymptomatic human immunodeficiency virus [HIV] infection status: Secondary | ICD-10-CM

## 2020-03-14 DIAGNOSIS — R002 Palpitations: Secondary | ICD-10-CM

## 2020-03-14 DIAGNOSIS — B182 Chronic viral hepatitis C: Secondary | ICD-10-CM | POA: Diagnosis not present

## 2020-03-14 DIAGNOSIS — B20 Human immunodeficiency virus [HIV] disease: Secondary | ICD-10-CM | POA: Diagnosis not present

## 2020-03-14 MED ORDER — BIKTARVY 50-200-25 MG PO TABS: 1.0000 | ORAL_TABLET | Freq: Every day | ORAL | 3 refills | Status: DC

## 2020-03-14 NOTE — Patient Instructions (Addendum)
Will start you back on Biktarvy once a day.  Call me if you notice a rash and send me a photo on your mychart if you can.   Stop by the lab on your way out.   Would like to see you back again in 2 months to recheck your labs.
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
## 2020-03-15 DIAGNOSIS — R002 Palpitations: Secondary | ICD-10-CM | POA: Insufficient documentation

## 2020-03-15 LAB — COMPLETE METABOLIC PANEL WITH GFR
AG Ratio: 1.3 (calc) (ref 1.0–2.5)
ALT: 8 U/L (ref 6–29)
AST: 20 U/L (ref 10–30)
Albumin: 4 g/dL (ref 3.6–5.1)
Alkaline phosphatase (APISO): 47 U/L (ref 31–125)
BUN: 14 mg/dL (ref 7–25)
CO2: 28 mmol/L (ref 20–32)
Calcium: 8.8 mg/dL (ref 8.6–10.2)
Chloride: 103 mmol/L (ref 98–110)
Creat: 1.05 mg/dL (ref 0.50–1.10)
GFR, Est African American: 83 mL/min/{1.73_m2} (ref >=60)
GFR, Est Non African American: 71 mL/min/{1.73_m2} (ref >=60)
Globulin: 3 g/dL (calc) (ref 1.9–3.7)
Glucose, Bld: 83 mg/dL (ref 65–99)
Potassium: 4.5 mmol/L (ref 3.5–5.3)
Sodium: 139 mmol/L (ref 135–146)
Total Bilirubin: 0.3 mg/dL (ref 0.2–1.2)
Total Protein: 7 g/dL (ref 6.1–8.1)

## 2020-03-15 NOTE — Assessment & Plan Note (Signed)
We were unable to draw labs today aside from a c-Met.  We will follow LFTs and give her a prescription to take to [facility] to draw hepatitis C quantitative RNA.

## 2020-03-15 NOTE — Assessment & Plan Note (Signed)
Its not clear to me that the Biktarvy caused the symptoms that she had.  Potentially could cause nausea diarrhea during the reintroduction of this medication, but the rash sounds suspicious for something else.  We discussed some options and given side effects to other antiretrovirals in the past would prefer her to retrial Biktarvy.  Can use topical over-the-counter hydrocortisone cream to the face if she does notice this comes back.  I asked her to please call me and discontinue the medication if it starts spreading beyond her face but to try to see how she responds with consistent dosing. No AIDS defining illness or concern for opportunistic infection present today.  We will check CD4 count, CBC with differential, HIV viral load with genotype.  Return in about 2 months (around 05/12/2020).

## 2020-03-15 NOTE — Assessment & Plan Note (Signed)
I will check thyroid panel

## 2020-03-29 ENCOUNTER — Telehealth: Payer: Self-pay

## 2020-03-29 NOTE — Telephone Encounter (Signed)
Attempted to call patient, no answer and voicemail is full.   Sandie Ano, RN

## 2020-03-29 NOTE — Telephone Encounter (Signed)
-----   Message from Blanchard Kelch, NP sent at 03/29/2020  1:38 PM EST ----- Can someone help me get her a call to see if she and her husband, [name] have gone to [facility] for their blood work? If not they need to go.  Thank you!

## 2020-04-02 NOTE — Telephone Encounter (Signed)
Mychart message sent to inquire about lab work. Awaiting response.  Sascha Baugher Loyola Mast, RN

## 2020-05-16 ENCOUNTER — Ambulatory Visit: Payer: Medicaid Other | Admitting: Infectious Diseases

## 2020-07-21 ENCOUNTER — Other Ambulatory Visit: Payer: Self-pay | Admitting: Infectious Diseases

## 2020-07-21 DIAGNOSIS — B20 Human immunodeficiency virus [HIV] disease: Secondary | ICD-10-CM

## 2020-07-22 NOTE — Telephone Encounter (Signed)
Called patient to schedule follow up appointment, no answer and mailbox is full.   Sandie Ano, RN

## 2020-08-23 ENCOUNTER — Other Ambulatory Visit: Payer: Self-pay | Admitting: Infectious Diseases

## 2020-08-23 ENCOUNTER — Other Ambulatory Visit: Payer: Self-pay

## 2020-08-23 DIAGNOSIS — B20 Human immunodeficiency virus [HIV] disease: Secondary | ICD-10-CM

## 2020-09-03 DIAGNOSIS — D638 Anemia in other chronic diseases classified elsewhere: Secondary | ICD-10-CM | POA: Insufficient documentation

## 2020-09-03 DIAGNOSIS — N390 Urinary tract infection, site not specified: Secondary | ICD-10-CM | POA: Insufficient documentation

## 2020-09-03 DIAGNOSIS — B9620 Unspecified Escherichia coli [E. coli] as the cause of diseases classified elsewhere: Secondary | ICD-10-CM | POA: Insufficient documentation

## 2020-09-05 DIAGNOSIS — D682 Hereditary deficiency of other clotting factors: Secondary | ICD-10-CM | POA: Insufficient documentation

## 2020-09-08 DIAGNOSIS — E0590 Thyrotoxicosis, unspecified without thyrotoxic crisis or storm: Secondary | ICD-10-CM | POA: Insufficient documentation

## 2020-09-27 ENCOUNTER — Other Ambulatory Visit: Payer: Self-pay

## 2020-09-27 ENCOUNTER — Ambulatory Visit (INDEPENDENT_AMBULATORY_CARE_PROVIDER_SITE_OTHER): Payer: Medicaid Other | Admitting: Infectious Diseases

## 2020-09-27 DIAGNOSIS — B182 Chronic viral hepatitis C: Secondary | ICD-10-CM

## 2020-09-27 DIAGNOSIS — J189 Pneumonia, unspecified organism: Secondary | ICD-10-CM | POA: Diagnosis not present

## 2020-09-27 DIAGNOSIS — B20 Human immunodeficiency virus [HIV] disease: Secondary | ICD-10-CM

## 2020-09-27 DIAGNOSIS — J181 Lobar pneumonia, unspecified organism: Secondary | ICD-10-CM | POA: Insufficient documentation

## 2020-09-27 MED ORDER — BIKTARVY 50-200-25 MG PO TABS: 1.0000 | ORAL_TABLET | Freq: Every day | ORAL | 5 refills | Status: DC

## 2020-09-27 NOTE — Assessment & Plan Note (Signed)
She will have hep C rna quant drawn at upcoming lab draw via [facility]. LFTs reviewed from recent hospitalization and normalized so hopeful that they have been effectively cured.

## 2020-09-27 NOTE — Patient Instructions (Addendum)
Please continue your [place] everyday. Continue dapsone everyday.   Finish the [place] antibiotic  Ask your new primary care provider about your thyroid in 1 month and a repeat chest xray.   Think about the [place] - my biggest concern for you is the transportation for you to get it.  We can talk next visit more  Go to the [facility] near your house in 1 month (end of September) and email me when you got it drawn so I can find it.   See you end of December!

## 2020-09-27 NOTE — Assessment & Plan Note (Signed)
VL > 80,000 during recent hospitalization with genotype pending. She stopped taking biktarvy abruptly without frequent interrupted doses. I would be surprised if she has developed resistance in this setting. Will have her repeat VL at local [facility] in 4 weeks since she just had it drawn in the hospital. Repeat CD4 at that time - may be that this truly has dropped that much over the year off antiretrovirals or may be more reflective of acute illness process.  Will have her continue dapsone for 3 months and repeat CD4 at next draw.  We did spend a lot of time discussing what transition looks like from orals to [place]. She is interested in monthly dosing - largely my concern for this would be ablity to have her come monthly for clinic visits. I don't think there is a close option for [place] infusion near her.  FU in person in 4 months.

## 2020-09-27 NOTE — Assessment & Plan Note (Signed)
Recent hospitalization reviewed - doing well and improved. Advised to finish omnicef. Would like to have her PCP repeat chest xray for follow up locally in 4 weeks.
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
## 2020-12-23 ENCOUNTER — Other Ambulatory Visit: Payer: Self-pay | Admitting: Family

## 2020-12-23 MED ORDER — CEFDINIR 300 MG PO CAPS: 300.0000 mg | ORAL_CAPSULE | Freq: Two times a day (BID) | ORAL | 0 refills | Status: DC

## 2021-01-21 ENCOUNTER — Ambulatory Visit: Payer: Medicaid Other | Admitting: Infectious Diseases

## 2021-03-28 ENCOUNTER — Telehealth: Payer: Self-pay

## 2021-03-28 NOTE — Telephone Encounter (Signed)
Called patient to get an appointment scheduled, voicemail is full and no answer.
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
## 2021-04-17 ENCOUNTER — Encounter: Payer: Self-pay | Admitting: Infectious Diseases

## 2021-04-17 ENCOUNTER — Other Ambulatory Visit: Payer: Self-pay

## 2021-04-17 ENCOUNTER — Ambulatory Visit (INDEPENDENT_AMBULATORY_CARE_PROVIDER_SITE_OTHER): Payer: Medicaid Other | Admitting: Infectious Diseases

## 2021-04-17 VITALS — BP 107/75 | HR 83 | Temp 98.4°F | Wt 110.8 lb

## 2021-04-17 DIAGNOSIS — B182 Chronic viral hepatitis C: Secondary | ICD-10-CM | POA: Diagnosis not present

## 2021-04-17 DIAGNOSIS — B20 Human immunodeficiency virus [HIV] disease: Secondary | ICD-10-CM | POA: Diagnosis not present

## 2021-04-17 DIAGNOSIS — J189 Pneumonia, unspecified organism: Secondary | ICD-10-CM | POA: Diagnosis present

## 2021-04-17 DIAGNOSIS — J479 Bronchiectasis, uncomplicated: Secondary | ICD-10-CM | POA: Diagnosis not present

## 2021-04-17 MED ORDER — DAPSONE 100 MG PO TABS: 100.0000 mg | ORAL_TABLET | Freq: Every day | ORAL | 4 refills | Status: DC

## 2021-04-17 MED ORDER — ALBUTEROL SULFATE HFA 108 (90 BASE) MCG/ACT IN AERS: 2.0000 | INHALATION_SPRAY | Freq: Four times a day (QID) | RESPIRATORY_TRACT | 11 refills | Status: DC | PRN

## 2021-04-17 MED ORDER — BIKTARVY 50-200-25 MG PO TABS: 1.0000 | ORAL_TABLET | Freq: Every day | ORAL | 11 refills | Status: DC

## 2021-04-17 NOTE — Assessment & Plan Note (Signed)
Draw hepatitis C rna today to ensure she is cured of hepatitis C infection.  ?LFTs during recent hospital stay normal which is reassuring.  ?

## 2021-04-17 NOTE — Assessment & Plan Note (Signed)
Refill albuterol inhaler today. Would like to ideally get her back to see pulm team since it sounds like she still has relatively frequent symptoms related to this.  ?No productive sputum, keep in mind to do AFB sputum if she has any production given immunocompromised state.  ?

## 2021-04-17 NOTE — Patient Instructions (Addendum)
Start back you Biktarvy and the Dapsone  ? ?Stop by the lab on your way out.  ? ? ?Will see you back in July.  ? ?If you want we can also send your medication to the [facility] like [name] so they can contact you for both the prescriptions and they can mail them both together.  ?I can easily fix this for you - let me know! Might be a good thing to try.  ?

## 2021-04-17 NOTE — Assessment & Plan Note (Signed)
Hospital records review during recent admission in February including results of CT scans indicating new findings of bronchiectasis. She is breathing comfortably on exam today, speaking in full sentences and did not have any coughing demonstrated.  ?Will arrange a follow up CT scan over the next 4 weeks to ensure resolution of multifocal pneumonia.  ?

## 2021-04-17 NOTE — Assessment & Plan Note (Addendum)
Last CD4 in the setting of acute severe pneumonia only 31. Previously in 2020 it was up to nearly 400. We had a a discussion today regarding the difference between HIV and AIDS and how AIDS can be recoverable if she stays on her medications regularly. Questions answered to her satisfaction.  ?Genotype from Aug 2022 sensitive. Continue biktarvy once daily.  ?Dapsone for OI prophylaxis.  ? ?RTC in 65m for follow up care.  ?

## 2021-05-05 MED ORDER — CEFDINIR 300 MG PO CAPS: 300.0000 mg | ORAL_CAPSULE | Freq: Two times a day (BID) | ORAL | 0 refills | Status: AC

## 2021-05-05 MED ORDER — NITROFURANTOIN MONOHYD MACRO 100 MG PO CAPS: 100.0000 mg | ORAL_CAPSULE | Freq: Two times a day (BID) | ORAL | 0 refills | Status: DC

## 2021-05-05 NOTE — Addendum Note (Signed)
Addended by: Blanchard Kelch on: 05/05/2021 09:50 AM ? ? Modules accepted: Orders ? ?

## 2021-06-24 ENCOUNTER — Inpatient Hospital Stay (HOSPITAL_COMMUNITY)
Admission: AD | Admit: 2021-06-24 | Discharge: 2021-07-01 | DRG: 974 | Disposition: A | Discharge status: Home / Self Care | Payer: Medicaid Other | Source: Other Acute Inpatient Hospital | Attending: Internal Medicine | Admitting: Internal Medicine

## 2021-06-24 DIAGNOSIS — Z20822 Contact with and (suspected) exposure to covid-19: Secondary | ICD-10-CM | POA: Diagnosis present

## 2021-06-24 DIAGNOSIS — Z79899 Other long term (current) drug therapy: Secondary | ICD-10-CM | POA: Diagnosis not present

## 2021-06-24 DIAGNOSIS — Z91410 Personal history of adult physical and sexual abuse: Secondary | ICD-10-CM | POA: Diagnosis not present

## 2021-06-24 DIAGNOSIS — B182 Chronic viral hepatitis C: Secondary | ICD-10-CM | POA: Diagnosis present

## 2021-06-24 DIAGNOSIS — J440 Chronic obstructive pulmonary disease with acute lower respiratory infection: Secondary | ICD-10-CM | POA: Diagnosis present

## 2021-06-24 DIAGNOSIS — Z881 Allergy status to other antibiotic agents status: Secondary | ICD-10-CM

## 2021-06-24 DIAGNOSIS — F4310 Post-traumatic stress disorder, unspecified: Secondary | ICD-10-CM | POA: Diagnosis present

## 2021-06-24 DIAGNOSIS — B20 Human immunodeficiency virus [HIV] disease: Secondary | ICD-10-CM | POA: Diagnosis present

## 2021-06-24 DIAGNOSIS — Z87440 Personal history of urinary (tract) infections: Secondary | ICD-10-CM | POA: Diagnosis not present

## 2021-06-24 DIAGNOSIS — J181 Lobar pneumonia, unspecified organism: Secondary | ICD-10-CM

## 2021-06-24 DIAGNOSIS — Z681 Body mass index (BMI) 19 or less, adult: Secondary | ICD-10-CM | POA: Diagnosis not present

## 2021-06-24 DIAGNOSIS — I959 Hypotension, unspecified: Secondary | ICD-10-CM | POA: Diagnosis present

## 2021-06-24 DIAGNOSIS — E2740 Unspecified adrenocortical insufficiency: Secondary | ICD-10-CM | POA: Diagnosis present

## 2021-06-24 DIAGNOSIS — J479 Bronchiectasis, uncomplicated: Secondary | ICD-10-CM | POA: Diagnosis not present

## 2021-06-24 DIAGNOSIS — Z9109 Other allergy status, other than to drugs and biological substances: Secondary | ICD-10-CM

## 2021-06-24 DIAGNOSIS — J159 Unspecified bacterial pneumonia: Secondary | ICD-10-CM | POA: Diagnosis present

## 2021-06-24 DIAGNOSIS — D509 Iron deficiency anemia, unspecified: Secondary | ICD-10-CM | POA: Diagnosis present

## 2021-06-24 DIAGNOSIS — Z8619 Personal history of other infectious and parasitic diseases: Secondary | ICD-10-CM | POA: Diagnosis not present

## 2021-06-24 DIAGNOSIS — E43 Unspecified severe protein-calorie malnutrition: Secondary | ICD-10-CM | POA: Diagnosis present

## 2021-06-24 DIAGNOSIS — Z91148 Patient's other noncompliance with medication regimen for other reason: Secondary | ICD-10-CM

## 2021-06-24 DIAGNOSIS — B9789 Other viral agents as the cause of diseases classified elsewhere: Secondary | ICD-10-CM | POA: Diagnosis present

## 2021-06-24 DIAGNOSIS — J189 Pneumonia, unspecified organism: Principal | ICD-10-CM | POA: Diagnosis present

## 2021-06-24 DIAGNOSIS — J1289 Other viral pneumonia: Secondary | ICD-10-CM | POA: Diagnosis present

## 2021-06-24 DIAGNOSIS — J441 Chronic obstructive pulmonary disease with (acute) exacerbation: Secondary | ICD-10-CM | POA: Diagnosis present

## 2021-06-24 DIAGNOSIS — Z882 Allergy status to sulfonamides status: Secondary | ICD-10-CM | POA: Diagnosis not present

## 2021-06-24 DIAGNOSIS — Z9049 Acquired absence of other specified parts of digestive tract: Secondary | ICD-10-CM

## 2021-06-24 DIAGNOSIS — R0602 Shortness of breath: Secondary | ICD-10-CM | POA: Diagnosis present

## 2021-06-24 LAB — BLOOD GAS, VENOUS
Acid-Base Excess: 1.2 mmol/L (ref 0.0–2.0)
Bicarbonate: 26 mmol/L (ref 20.0–28.0)
Drawn by: 6717
O2 Saturation: 85.6 %
Patient temperature: 37
pCO2, Ven: 41 mmHg — ABNORMAL LOW (ref 44–60)
pH, Ven: 7.41 (ref 7.25–7.43)
pO2, Ven: 48 mmHg — ABNORMAL HIGH (ref 32–45)

## 2021-06-24 LAB — BASIC METABOLIC PANEL
Anion gap: 9 (ref 5–15)
BUN: 16 mg/dL (ref 6–20)
CO2: 25 mmol/L (ref 22–32)
Calcium: 8.3 mg/dL — ABNORMAL LOW (ref 8.9–10.3)
Chloride: 102 mmol/L (ref 98–111)
Creatinine, Ser: 1.13 mg/dL — ABNORMAL HIGH (ref 0.44–1.00)
GFR, Estimated: >60 mL/min (ref >60)
Glucose, Bld: 145 mg/dL — ABNORMAL HIGH (ref 70–99)
Potassium: 3.3 mmol/L — ABNORMAL LOW (ref 3.5–5.1)
Sodium: 136 mmol/L (ref 135–145)

## 2021-06-24 LAB — RETICULOCYTES
Immature Retic Fract: 25 % — ABNORMAL HIGH (ref 2.3–15.9)
RBC.: 3.28 MIL/uL — ABNORMAL LOW (ref 3.87–5.11)
Retic Count, Absolute: 35.8 10*3/uL (ref 19.0–186.0)
Retic Ct Pct: 1.1 % (ref 0.4–3.1)

## 2021-06-24 LAB — IRON AND TIBC
Iron: 36 ug/dL (ref 28–170)
Saturation Ratios: 19 % (ref 10.4–31.8)
TIBC: 186 ug/dL — ABNORMAL LOW (ref 250–450)
UIBC: 150 ug/dL

## 2021-06-24 LAB — LACTATE DEHYDROGENASE: LDH: 105 U/L (ref 98–192)

## 2021-06-24 LAB — FERRITIN: Ferritin: 210 ng/mL (ref 11–307)

## 2021-06-24 LAB — PROCALCITONIN: Procalcitonin: 0.31 ng/mL

## 2021-06-24 MED ORDER — RISAQUAD PO CAPS
1.0000 | ORAL_CAPSULE | Freq: Three times a day (TID) | ORAL | Status: DC
  Administered 2021-06-27 – 2021-07-01 (×12): 1 via ORAL
  Filled 2021-06-24 (×13): qty 1

## 2021-06-24 MED ORDER — PREDNISONE 20 MG PO TABS
40.0000 mg | ORAL_TABLET | Freq: Every day | ORAL | Status: DC
  Administered 2021-06-25 – 2021-06-26 (×2): 40 mg via ORAL
  Filled 2021-06-24 (×2): qty 2

## 2021-06-24 MED ORDER — CHLORHEXIDINE GLUCONATE CLOTH 2 % EX PADS
6.0000 | MEDICATED_PAD | Freq: Every day | CUTANEOUS | Status: DC
  Administered 2021-06-25 – 2021-06-27 (×3): 6 via TOPICAL

## 2021-06-24 MED ORDER — BICTEGRAVIR-EMTRICITAB-TENOFOV 50-200-25 MG PO TABS
1.0000 | ORAL_TABLET | Freq: Every day | ORAL | Status: DC
  Administered 2021-06-25 – 2021-06-30 (×6): 1 via ORAL
  Filled 2021-06-24 (×8): qty 1

## 2021-06-24 MED ORDER — FLUTICASONE FUROATE-VILANTEROL 200-25 MCG/ACT IN AEPB
1.0000 | INHALATION_SPRAY | Freq: Every day | RESPIRATORY_TRACT | Status: DC
  Administered 2021-06-25 – 2021-06-26 (×2): 1 via RESPIRATORY_TRACT
  Filled 2021-06-24 (×2): qty 28

## 2021-06-24 MED ORDER — BACID PO TABS: 2.0000 | ORAL_TABLET | Freq: Three times a day (TID) | ORAL | Status: DC

## 2021-06-24 MED ORDER — SODIUM CHLORIDE 0.9% FLUSH
10.0000 mL | INTRAVENOUS | Status: DC | PRN
  Administered 2021-06-27: 10 mL

## 2021-06-24 MED ORDER — ENSURE ENLIVE PO LIQD: 237.0000 mL | Freq: Two times a day (BID) | ORAL | Status: DC

## 2021-06-24 MED ORDER — BUDESONIDE 0.25 MG/2ML IN SUSP
0.2500 mg | Freq: Two times a day (BID) | RESPIRATORY_TRACT | Status: DC
  Administered 2021-06-25 – 2021-06-27 (×5): 0.25 mg via RESPIRATORY_TRACT
  Filled 2021-06-24 (×9): qty 2

## 2021-06-24 MED ORDER — SODIUM CHLORIDE 0.9 % IV SOLN
2.0000 g | Freq: Once | INTRAVENOUS | Status: AC
  Administered 2021-06-24: 2 g via INTRAVENOUS
  Filled 2021-06-24: qty 12.5

## 2021-06-24 MED ORDER — ALBUTEROL SULFATE (2.5 MG/3ML) 0.083% IN NEBU: 3.0000 mL | INHALATION_SOLUTION | Freq: Four times a day (QID) | RESPIRATORY_TRACT | Status: DC | PRN

## 2021-06-24 MED ORDER — BUDESONIDE 0.25 MG/2ML IN SUSP
RESPIRATORY_TRACT | Status: DC
Start: 2021-06-24 — End: 2021-06-24
  Filled 2021-06-24: qty 2

## 2021-06-24 MED ORDER — DAPSONE 100 MG PO TABS
100.0000 mg | ORAL_TABLET | Freq: Every day | ORAL | Status: DC
  Administered 2021-06-25 – 2021-07-01 (×7): 100 mg via ORAL
  Filled 2021-06-24 (×8): qty 1

## 2021-06-24 MED ORDER — GUAIFENESIN ER 600 MG PO TB12
1200.0000 mg | ORAL_TABLET | Freq: Two times a day (BID) | ORAL | Status: DC
  Administered 2021-06-24 – 2021-07-01 (×14): 1200 mg via ORAL
  Filled 2021-06-24 (×14): qty 2

## 2021-06-24 MED ORDER — SODIUM CHLORIDE 0.9% FLUSH
10.0000 mL | Freq: Two times a day (BID) | INTRAVENOUS | Status: DC
  Administered 2021-06-24: 10 mL

## 2021-06-24 MED ORDER — IPRATROPIUM-ALBUTEROL 0.5-2.5 (3) MG/3ML IN SOLN
3.0000 mL | Freq: Four times a day (QID) | RESPIRATORY_TRACT | Status: DC
  Administered 2021-06-24: 3 mL via RESPIRATORY_TRACT
  Filled 2021-06-24: qty 3

## 2021-06-24 MED ORDER — IPRATROPIUM-ALBUTEROL 0.5-2.5 (3) MG/3ML IN SOLN
3.0000 mL | Freq: Three times a day (TID) | RESPIRATORY_TRACT | Status: DC
  Administered 2021-06-25 – 2021-06-26 (×2): 3 mL via RESPIRATORY_TRACT
  Filled 2021-06-24 (×5): qty 3

## 2021-06-24 MED ORDER — VANCOMYCIN HCL IN DEXTROSE 1-5 GM/200ML-% IV SOLN
1000.0000 mg | INTRAVENOUS | Status: DC
  Administered 2021-06-25: 1000 mg via INTRAVENOUS
  Filled 2021-06-24: qty 200

## 2021-06-24 MED ORDER — SODIUM CHLORIDE 0.9 % IV SOLN
2.0000 g | Freq: Three times a day (TID) | INTRAVENOUS | Status: DC
  Administered 2021-06-25: 2 g via INTRAVENOUS
  Filled 2021-06-24: qty 12.5

## 2021-06-24 NOTE — Progress Notes (Signed)
Pharmacy Antibiotic Note  Frances Ball is a 31 y.o. female admitted on 06/24/2021 with pneumonia.  Pharmacy has been consulted for Vancomycin/cefepime dosing.   Last dose of vancomycin 5/23 @0406  S/p cefepime 2gm x1 and clindamycin x1 Scr 1.13 Weight 52 kg Height 5'3"  Plan: Vancomycin 1000mg  q24hr Cefepime 2gm q8hr Plan to obtain levels at steady state if therapy continued  Will monitor for acute changes in renal function and adjust as needed F/u cultures results and de-escalate as appropriate  Temp (24hrs), Avg:97.6 F (36.4 C), Min:97.5 F (36.4 C), Max:97.6 F (36.4 C)  No results for input(s): WBC, CREATININE, LATICACIDVEN, VANCOTROUGH, VANCOPEAK, VANCORANDOM, GENTTROUGH, GENTPEAK, GENTRANDOM, TOBRATROUGH, TOBRAPEAK, TOBRARND, AMIKACINPEAK, AMIKACINTROU, AMIKACIN in the last 168 hours.  CrCl cannot be calculated (Patient's most recent lab result is older than the maximum 21 days allowed.).    Allergies  Allergen Reactions   Levaquin [Levofloxacin In D5w] Shortness Of Breath and Rash   Zithromax [Azithromycin] Shortness Of Breath, Swelling and Other (See Comments)    Pt reports tightness in skin with redness    Bactrim [Sulfamethoxazole-Trimethoprim] Swelling and Other (See Comments)    Pt reports that she was placed on Bactrim for pneumonia when she was d/c from [facility] and developed a red face with swelling and can not tolerate Bactrim.    Doxycycline Other (See Comments)    Stomach cramps   Sulfa Antibiotics Swelling   Vancomycin Other (See Comments)    Intolerance, pt states that as long as med is administered with Benadryl its tolarable   Darunavir Rash    Thank you for allowing pharmacy to be a part of this patient's care.  , PharmD Clinical Pharmacist  Please check AMION for all [facility] Pharmacy numbers After 10:00 PM, call Main Pharmacy [phone]
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
## 2021-06-25 ENCOUNTER — Inpatient Hospital Stay (HOSPITAL_COMMUNITY): Payer: Medicaid Other

## 2021-06-25 ENCOUNTER — Other Ambulatory Visit (HOSPITAL_COMMUNITY): Payer: Self-pay

## 2021-06-25 DIAGNOSIS — B20 Human immunodeficiency virus [HIV] disease: Secondary | ICD-10-CM | POA: Diagnosis not present

## 2021-06-25 DIAGNOSIS — J189 Pneumonia, unspecified organism: Secondary | ICD-10-CM | POA: Diagnosis not present

## 2021-06-25 DIAGNOSIS — J479 Bronchiectasis, uncomplicated: Secondary | ICD-10-CM

## 2021-06-25 DIAGNOSIS — J181 Lobar pneumonia, unspecified organism: Secondary | ICD-10-CM | POA: Diagnosis not present

## 2021-06-25 LAB — CBC
HCT: 23.8 % — ABNORMAL LOW (ref 36.0–46.0)
Hemoglobin: 7.6 g/dL — ABNORMAL LOW (ref 12.0–15.0)
MCH: 26 pg (ref 26.0–34.0)
MCHC: 31.9 g/dL (ref 30.0–36.0)
MCV: 81.5 fL (ref 80.0–100.0)
Platelets: 445 10*3/uL — ABNORMAL HIGH (ref 150–400)
RBC: 2.92 MIL/uL — ABNORMAL LOW (ref 3.87–5.11)
RDW: 17 % — ABNORMAL HIGH (ref 11.5–15.5)
WBC: 8.4 10*3/uL (ref 4.0–10.5)
nRBC: 0 % (ref 0.0–0.2)

## 2021-06-25 LAB — RESPIRATORY PANEL BY PCR

## 2021-06-25 LAB — COMPREHENSIVE METABOLIC PANEL
ALT: 8 U/L (ref 0–44)
AST: 15 U/L (ref 15–41)
Albumin: 2.1 g/dL — ABNORMAL LOW (ref 3.5–5.0)
Alkaline Phosphatase: 33 U/L — ABNORMAL LOW (ref 38–126)
Anion gap: 7 (ref 5–15)
BUN: 15 mg/dL (ref 6–20)
CO2: 25 mmol/L (ref 22–32)
Calcium: 8.1 mg/dL — ABNORMAL LOW (ref 8.9–10.3)
Chloride: 105 mmol/L (ref 98–111)
Creatinine, Ser: 1.08 mg/dL — ABNORMAL HIGH (ref 0.44–1.00)
GFR, Estimated: >60 mL/min (ref >60)
Glucose, Bld: 99 mg/dL (ref 70–99)
Potassium: 3.9 mmol/L (ref 3.5–5.1)
Sodium: 137 mmol/L (ref 135–145)
Total Bilirubin: 0.4 mg/dL (ref 0.3–1.2)
Total Protein: 5.6 g/dL — ABNORMAL LOW (ref 6.5–8.1)

## 2021-06-25 LAB — IRON AND TIBC
Iron: 26 ug/dL — ABNORMAL LOW (ref 28–170)
Saturation Ratios: 14 % (ref 10.4–31.8)
TIBC: 188 ug/dL — ABNORMAL LOW (ref 250–450)
UIBC: 162 ug/dL

## 2021-06-25 LAB — CD4/CD8 (T-HELPER/T-SUPPRESSOR CELL)
CD4 absolute: 110 /uL — ABNORMAL LOW (ref 400–1790)
CD4%: 14.76 % — ABNORMAL LOW (ref 33–65)
CD8 T Cell Abs: 500 /uL (ref 190–1000)
CD8tox: 67.17 % — ABNORMAL HIGH (ref 12–40)
Ratio: 0.22 — ABNORMAL LOW (ref 1.0–3.0)
Total lymphocyte count: 744 /uL — ABNORMAL LOW (ref 1000–4000)

## 2021-06-25 LAB — MAGNESIUM: Magnesium: 2.1 mg/dL (ref 1.7–2.4)

## 2021-06-25 LAB — MRSA NEXT GEN BY PCR, NASAL: MRSA by PCR Next Gen: DETECTED — AB

## 2021-06-25 LAB — BRAIN NATRIURETIC PEPTIDE: B Natriuretic Peptide: 16.9 pg/mL (ref 0.0–100.0)

## 2021-06-25 LAB — C-REACTIVE PROTEIN: CRP: 7.3 mg/dL — ABNORMAL HIGH (ref <1.0)

## 2021-06-25 LAB — RETICULOCYTES
Immature Retic Fract: 31.7 % — ABNORMAL HIGH (ref 2.3–15.9)
RBC.: 3.05 MIL/uL — ABNORMAL LOW (ref 3.87–5.11)
Retic Count, Absolute: 36.9 10*3/uL (ref 19.0–186.0)
Retic Ct Pct: 1.2 % (ref 0.4–3.1)

## 2021-06-25 LAB — FOLATE: Folate: 4.6 ng/mL — ABNORMAL LOW (ref >5.9)

## 2021-06-25 LAB — FERRITIN: Ferritin: 182 ng/mL (ref 11–307)

## 2021-06-25 LAB — VITAMIN B12: Vitamin B-12: 325 pg/mL (ref 180–914)

## 2021-06-25 LAB — PROCALCITONIN: Procalcitonin: 0.21 ng/mL

## 2021-06-25 LAB — PHOSPHORUS: Phosphorus: 3.6 mg/dL (ref 2.5–4.6)

## 2021-06-25 MED ORDER — IOHEXOL 9 MG/ML PO SOLN
ORAL | Status: AC
  Filled 2021-06-25: qty 1000

## 2021-06-25 MED ORDER — DOXYCYCLINE HYCLATE 100 MG PO TABS
100.0000 mg | ORAL_TABLET | Freq: Two times a day (BID) | ORAL | Status: DC
  Administered 2021-06-25 – 2021-06-26 (×3): 100 mg via ORAL
  Filled 2021-06-25 (×3): qty 1

## 2021-06-25 MED ORDER — PROSOURCE PLUS PO LIQD
30.0000 mL | Freq: Two times a day (BID) | ORAL | Status: DC
  Administered 2021-06-25 – 2021-06-26 (×2): 30 mL via ORAL
  Filled 2021-06-25 (×6): qty 30

## 2021-06-25 MED ORDER — IOHEXOL 300 MG/ML  SOLN
100.0000 mL | Freq: Once | INTRAMUSCULAR | Status: AC | PRN
  Administered 2021-06-25: 100 mL via INTRAVENOUS

## 2021-06-25 MED ORDER — HEPARIN SODIUM (PORCINE) 5000 UNIT/ML IJ SOLN
5000.0000 [IU] | Freq: Three times a day (TID) | INTRAMUSCULAR | Status: DC
Start: 2021-06-25 — End: 2021-07-01
  Filled 2021-06-25 (×6): qty 1

## 2021-06-25 MED ORDER — POTASSIUM CHLORIDE CRYS ER 20 MEQ PO TBCR
40.0000 meq | EXTENDED_RELEASE_TABLET | Freq: Once | ORAL | Status: DC
Start: 2021-06-25 — End: 2021-06-25

## 2021-06-25 MED ORDER — POTASSIUM CHLORIDE 10 MEQ/100ML IV SOLN
10.0000 meq | INTRAVENOUS | Status: AC
  Administered 2021-06-25 (×2): 10 meq via INTRAVENOUS
  Filled 2021-06-25 (×2): qty 100

## 2021-06-25 MED ORDER — SODIUM CHLORIDE 0.9 % IV SOLN: INTRAVENOUS | Status: AC

## 2021-06-25 MED ORDER — FERROUS SULFATE 325 (65 FE) MG PO TABS
325.0000 mg | ORAL_TABLET | Freq: Two times a day (BID) | ORAL | Status: DC
  Administered 2021-06-26 – 2021-07-01 (×10): 325 mg via ORAL
  Filled 2021-06-25 (×11): qty 1

## 2021-06-25 MED ORDER — SODIUM CHLORIDE 0.9 % IV SOLN: 25.0000 mg | Freq: Four times a day (QID) | INTRAVENOUS | Status: DC | PRN

## 2021-06-25 MED ORDER — SODIUM CHLORIDE 0.9 % IV SOLN
2.0000 g | INTRAVENOUS | Status: DC
  Administered 2021-06-25: 2 g via INTRAVENOUS
  Filled 2021-06-25: qty 20

## 2021-06-25 NOTE — Care Management (Signed)
  Transition of Care (TOC) Screening Note   Patient Details  Name: Frances Ball Date of Birth: 09-Feb-1990   Transition of Care [facility]) CM/SW Contact:    Lawerance Sabal, RN Phone Number: 06/25/2021, 7:32 AM    Transition of Care Department [facility]) has reviewed patient and no TOC needs have been identified at this time. We will continue to monitor patient advancement through interdisciplinary progression rounds. If new patient transition needs arise, please place a TOC consult.
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
## 2021-06-25 NOTE — Evaluation (Signed)
Occupational Therapy Evaluation Patient Details Name: Frances Ball MRN: [id number] DOB: 1990/12/29 Today's Date: 06/25/2021   History of Present Illness Frances Ball is a 31 y.o. female admitted with Multilobular pneumonia  PMH of HIV on HAART, protein calorie malnutrition, COPD, bronchiectasis, chronic microcytic anemia, presented with increasing of productive cough, shortness of breath, night sweats, weight loss, diarrhea. Recent admission in February with PNA.   Clinical Impression   Pt is currently independent for selfcare tasks sit to stand, transfers to the regular toilet, and for functional mobility in the room without an assistive device.  BP in sitting 94/62 and with standing 98/66 without any symptoms of light headedness.  HR increased from 112 supine in the bed up to 145 with mobility.  Oxygen sats 92% or greater on room air.  No acute or post acute OT needs at this time, will sign off.       Recommendations for follow up therapy are one component of a multi-disciplinary discharge planning process, led by the attending physician.  Recommendations may be updated based on patient status, additional functional criteria and insurance authorization.   Follow Up Recommendations  No OT follow up    Assistance Recommended at Discharge None     Functional Status Assessment  Patient has not had a recent decline in their functional status  Equipment Recommendations  None recommended by OT       Precautions / Restrictions Precautions Precaution Comments: elevated HR Restrictions Weight Bearing Restrictions: No      Mobility Bed Mobility Overal bed mobility: Independent                  Transfers Overall transfer level: Independent                        Balance Overall balance assessment: Independent                                         ADL either performed or assessed with clinical judgement   ADL Overall ADL's : Independent                                        General ADL Comments: Pt is currently independent for selfcare tasks sit to stand, functional mobility, and toilieting.  HR increases from 100 BPM at rest up to 145 with activity.  BP in standing 98/66 with pt reporting no symptoms of light headedness or dizziness.     Vision Baseline Vision/History: 0 No visual deficits Ability to See in Adequate Light: 0 Adequate Patient Visual Report: No change from baseline Vision Assessment?: No apparent visual deficits     Perception Perception Perception: Within Functional Limits   Praxis Praxis Praxis: Intact    Pertinent Vitals/Pain Pain Assessment Pain Assessment: No/denies pain     Hand Dominance Right   Extremity/Trunk Assessment Upper Extremity Assessment Upper Extremity Assessment: Overall WFL for tasks assessed   Lower Extremity Assessment Lower Extremity Assessment: Overall WFL for tasks assessed   Cervical / Trunk Assessment Cervical / Trunk Assessment: Normal   Communication Communication Communication: No difficulties   Cognition Arousal/Alertness: Awake/alert Behavior During Therapy: WFL for tasks assessed/performed Overall Cognitive Status: Within Functional Limits for tasks assessed  Home Living Family/patient expects to be discharged to:: Private residence Living Arrangements: Spouse/significant other;Children (Children 10-13 yrs) Available Help at Discharge: Available PRN/intermittently (spouse works at night) Type of Home: Mobile home Home Access: Ramped entrance     Home Layout: One level     Bathroom Shower/Tub: Other (comment) (walk-in tub)   Bathroom Toilet: Standard Bathroom Accessibility: Yes   Home Equipment: Shower seat          Prior Functioning/Environment Prior Level of Function : Independent/Modified Independent                                            AM-PAC OT "6 Clicks" Daily Activity     Outcome Measure Help from another person eating meals?: None Help from another person taking care of personal grooming?: None Help from another person toileting, which includes using toliet, bedpan, or urinal?: None Help from another person bathing (including washing, rinsing, drying)?: None Help from another person to put on and taking off regular upper body clothing?: None Help from another person to put on and taking off regular lower body clothing?: None 6 Click Score: 24   End of Session Nurse Communication: Mobility status  Activity Tolerance: Patient tolerated treatment well;Other (comment) (therapist limited activity secondary to elevated HR.) Patient left: in bed;with call bell/phone within reach;with nursing/sitter in room                   Time: [phone] OT Time Calculation (min): 15 min Charges:  OT General Charges $OT Visit: 1 Visit OT Evaluation $OT Eval Low Complexity: 1 Low Lache Dagher OTR/L 06/25/2021, 10:18 AM

## 2021-06-25 NOTE — Progress Notes (Signed)
Patient is refusing CT at this time, state she had one 2 days ago in [place] with  and without contrast.  On call provider notified.

## 2021-06-25 NOTE — TOC Benefit Eligibility Note (Signed)
Patient Teacher, English as a foreign language completed.    The patient is currently admitted and upon discharge could be taking dapsone 100 mg tablets.  The current 30 day co-pay is, $4.00.   The patient is insured through [place], [place] Patient Advocate Specialist [place] Patient Advocate Team Direct Number: [phone]  Fax: [phone]

## 2021-06-25 NOTE — Progress Notes (Signed)
PROGRESS NOTE                                                                                                                                                                                                             Patient Demographics:    Frances Ball, is a 31 y.o. female, DOB - 12/31/1990, YQI:[id number]  Outpatient Primary MD for the patient is Pcp, No    LOS - 1  Admit date - 06/24/2021    No chief complaint on file.      Brief Narrative (HPI from H&P)   31 y.o. female with medical history significant of HIV on HAART, protein calorie malnutrition, COPD, bronchiectasis, chronic microcytic anemia, presented with increasing of productive cough, shortness of breath, night sweats, weight loss, diarrhea.  Did with the diagnosis of HIV, pneumonia and unintentional weight loss along with AKI.   Subjective:    Frances Ball today has, No headache, No chest pain, No abdominal pain - No Nausea, No new weakness tingling or numbness, +ve cough , no SOB.   Assessment  & Plan :    Multilobar pneumonia in a patient with HIV.  Low CD4 count, ID on board, currently on broad-spectrum antibiotics which include vancomycin, cefepime, doxycycline and dapsone.  Not hypoxic since do not think this is a serious PCP pneumonia.  CT chest pending, sputum Gram stain culture, respiratory viral panel ordered.  Case discussed with ID they will be  seeing the patient shortly.  2.  History of diarrhea present on admission.  GI pathogen panel ordered.  ID to follow.  3.  History of COPD.  Mild wheezing upon admission, supportive care with nebulizer treatment, short trial of steroids started on admission will be completed.  Wheezing has improved.  4.  History of bronchiectasis.  Management as a #1 above, follow CT results.  5.  History of microcytic anemia.  Acute on chronic, no signs of active bleeding, iron replacement orally, will expect some fall  with heme dilution caused by IV fluids, patient has consented for transfusion if needed.  6.  Severe protein calorie malnutrition.  Placed on protein supplementation.  7.  HIV.  On Biktarvy, ID to follow.  Patient states she is compliant with treatment and medications.      Condition - Extremely Guarded  Family Communication  :  mother bedside  on 06/25/2021  Code Status : Full  Consults  : ID  PUD Prophylaxis :    Procedures  :            Disposition Plan  :    Status is: Inpatient  DVT Prophylaxis  :    heparin injection 5,000 Units Start: 06/25/21 1400 SCDs Start: 06/24/21 1938    Lab Results  Component Value Date   PLT 445 (H) 06/25/2021    Diet :  Diet Order             Diet regular Room service appropriate? Yes; Fluid consistency: Thin  Diet effective now                    Inpatient Medications  Scheduled Meds:  (feeding supplement) PROSource Plus  30 mL Oral BID BM   acidophilus  1 capsule Oral TID AC   bictegravir-emtricitabine-tenofovir AF  1 tablet Oral Daily   budesonide (PULMICORT) nebulizer solution  0.25 mg Nebulization BID   Chlorhexidine Gluconate Cloth  6 each Topical Daily   dapsone  100 mg Oral Daily   doxycycline  100 mg Oral Q12H   feeding supplement  237 mL Oral BID BM   ferrous sulfate  325 mg Oral BID WC   fluticasone furoate-vilanterol  1 puff Inhalation Daily   guaiFENesin  1,200 mg Oral BID   heparin injection (subcutaneous)  5,000 Units Subcutaneous Q8H   ipratropium-albuterol  3 mL Nebulization TID   predniSONE  40 mg Oral Q breakfast   Continuous Infusions:  sodium chloride 50 mL/hr at 06/25/21 0230   cefTRIAXone (ROCEPHIN)  IV     diphenhydrAMINE     PRN Meds:.albuterol, diphenhydrAMINE, sodium chloride flush     Time Spent in minutes  30   Susa Raring M.D on 06/25/2021 at 11:26 AM  To page go to [email or web address]   [facility] -  Office  [phone]  See all Orders from today for further  details    Objective:   Vitals:   06/25/21 0405 06/25/21 0810 06/25/21 0837 06/25/21 0840  BP: (!) 83/61  (!) 87/54   Pulse: 83  (!) 122   Resp: 18  17   Temp:   (!) 97.5 F (36.4 C)   TempSrc:      SpO2: 96% 93% 94% 96%    Wt Readings from Last 3 Encounters:  04/17/21 50.3 kg  09/27/20 51 kg  03/14/20 56.2 kg     Intake/Output Summary (Last 24 hours) at 06/25/2021 1126 Last data filed at 06/25/2021 0215 Gross per 24 hour  Intake 100 ml  Output --  Net 100 ml     Physical Exam  Awake Alert, No new F.N deficits, cachectic young female [place].AT,PERRAL Supple Neck, No JVD,   Symmetrical Chest wall movement, Good air movement bilaterally, CTAB RRR,No Gallops,Rubs or new Murmurs,  +ve B.Sounds, Abd Soft, No tenderness,   No Cyanosis, Clubbing or edema      Data Review:    CBC Recent Labs  Lab 06/25/21 0343  WBC 8.4  HGB 7.6*  HCT 23.8*  PLT 445*  MCV 81.5  MCH 26.0  MCHC 31.9  RDW 17.0*    Electrolytes Recent Labs  Lab 06/24/21 2107 06/25/21 0343 06/25/21 0817  NA 136 137  --   K 3.3* 3.9  --   CL 102 105  --   CO2 25 25  --   GLUCOSE 145* 99  --   BUN 16  15  --   CREATININE 1.13* 1.08*  --   CALCIUM 8.3* 8.1*  --   AST  --  15  --   ALT  --  8  --   ALKPHOS  --  33*  --   BILITOT  --  0.4  --   ALBUMIN  --  2.1*  --   MG  --  2.1  --   CRP  --   --  7.3*  PROCALCITON 0.31 0.21  --   BNP  --   --  16.9    ------------------------------------------------------------------------------------------------------------------ No results for input(s): CHOL, HDL, LDLCALC, TRIG, CHOLHDL, LDLDIRECT in the last 72 hours.  No results found for: HGBA1C  No results for input(s): TSH, T4TOTAL, T3FREE, THYROIDAB in the last 72 hours.  Invalid input(s): FREET3 ------------------------------------------------------------------------------------------------------------------ ID Labs Recent Labs  Lab 06/24/21 2107 06/25/21 0343 06/25/21 0817  WBC   --  8.4  --   PLT  --  445*  --   CRP  --   --  7.3*  PROCALCITON 0.31 0.21  --   CREATININE 1.13* 1.08*  --    Cardiac Enzymes No results for input(s): CKMB, TROPONINI, MYOGLOBIN in the last 168 hours.  Invalid input(s): CK   Micro Results No results found for this or any previous visit (from the past 240 hour(s)).  Radiology Reports DG Chest Port 1 View  Result Date: 06/25/2021 CLINICAL DATA:  Shortness of breath and cough with right chest pain EXAM: PORTABLE CHEST 1 VIEW COMPARISON:  10/27/2016 FINDINGS: Infiltrate at the right more than left base, stable from CT yesterday. No effusion or pneumothorax. Normal heart size and mediastinal contours accounting for rotation. Right PICC in good position. IMPRESSION: Unchanged right more than left infiltrate in the lower lungs. Electronically Signed   By: Tiburcio Pea M.D.   On: 06/25/2021 07:08

## 2021-06-25 NOTE — Progress Notes (Signed)
PT Cancellation Note  Patient Details Name: VENDELA TROUNG MRN: [id number] DOB: 1990/11/28   Cancelled Treatment:    Reason Eval/Treat Not Completed: PT screened, no needs identified, will sign off   Bevelyn Buckles 06/25/2021, 10:30 AM Hortensia Duffin M,PT Acute Rehab Services [phone] (pager)

## 2021-06-26 DIAGNOSIS — J181 Lobar pneumonia, unspecified organism: Secondary | ICD-10-CM | POA: Diagnosis not present

## 2021-06-26 DIAGNOSIS — J189 Pneumonia, unspecified organism: Secondary | ICD-10-CM | POA: Diagnosis not present

## 2021-06-26 DIAGNOSIS — J479 Bronchiectasis, uncomplicated: Secondary | ICD-10-CM | POA: Diagnosis not present

## 2021-06-26 DIAGNOSIS — B20 Human immunodeficiency virus [HIV] disease: Secondary | ICD-10-CM | POA: Diagnosis not present

## 2021-06-26 LAB — C-REACTIVE PROTEIN: CRP: 6.2 mg/dL — ABNORMAL HIGH (ref <1.0)

## 2021-06-26 LAB — TYPE AND SCREEN
ABO/RH(D): O POS
Antibody Screen: NEGATIVE

## 2021-06-26 LAB — COMPREHENSIVE METABOLIC PANEL
ALT: 8 U/L (ref 0–44)
AST: 13 U/L — ABNORMAL LOW (ref 15–41)
Albumin: 2.2 g/dL — ABNORMAL LOW (ref 3.5–5.0)
Alkaline Phosphatase: 36 U/L — ABNORMAL LOW (ref 38–126)
Anion gap: 6 (ref 5–15)
BUN: 7 mg/dL (ref 6–20)
CO2: 26 mmol/L (ref 22–32)
Calcium: 8.5 mg/dL — ABNORMAL LOW (ref 8.9–10.3)
Chloride: 105 mmol/L (ref 98–111)
Creatinine, Ser: 0.94 mg/dL (ref 0.44–1.00)
GFR, Estimated: >60 mL/min (ref >60)
Glucose, Bld: 97 mg/dL (ref 70–99)
Potassium: 3.6 mmol/L (ref 3.5–5.1)
Sodium: 137 mmol/L (ref 135–145)
Total Bilirubin: 0.6 mg/dL (ref 0.3–1.2)
Total Protein: 5.9 g/dL — ABNORMAL LOW (ref 6.5–8.1)

## 2021-06-26 LAB — MAGNESIUM: Magnesium: 2.3 mg/dL (ref 1.7–2.4)

## 2021-06-26 LAB — CBC WITH DIFFERENTIAL/PLATELET
Abs Immature Granulocytes: 0.06 10*3/uL (ref 0.00–0.07)
Basophils Absolute: 0 10*3/uL (ref 0.0–0.1)
Basophils Relative: 0 %
Eosinophils Absolute: 0 10*3/uL (ref 0.0–0.5)
Eosinophils Relative: 0 %
HCT: 23.9 % — ABNORMAL LOW (ref 36.0–46.0)
Hemoglobin: 7.6 g/dL — ABNORMAL LOW (ref 12.0–15.0)
Immature Granulocytes: 1 %
Lymphocytes Relative: 15 %
Lymphs Abs: 1.1 10*3/uL (ref 0.7–4.0)
MCH: 26 pg (ref 26.0–34.0)
MCHC: 31.8 g/dL (ref 30.0–36.0)
MCV: 81.8 fL (ref 80.0–100.0)
Monocytes Absolute: 0.4 10*3/uL (ref 0.1–1.0)
Monocytes Relative: 5 %
Neutro Abs: 5.4 10*3/uL (ref 1.7–7.7)
Neutrophils Relative %: 79 %
Platelets: 487 10*3/uL — ABNORMAL HIGH (ref 150–400)
RBC: 2.92 MIL/uL — ABNORMAL LOW (ref 3.87–5.11)
RDW: 17.1 % — ABNORMAL HIGH (ref 11.5–15.5)
WBC: 6.9 10*3/uL (ref 4.0–10.5)
nRBC: 0 % (ref 0.0–0.2)

## 2021-06-26 LAB — TSH: TSH: 1.393 u[IU]/mL (ref 0.350–4.500)

## 2021-06-26 LAB — CORTISOL: Cortisol, Plasma: 2.3 ug/dL

## 2021-06-26 LAB — PROCALCITONIN: Procalcitonin: 0.11 ng/mL

## 2021-06-26 LAB — CRYPTOCOCCAL ANTIGEN: Crypto Ag: NEGATIVE

## 2021-06-26 LAB — BRAIN NATRIURETIC PEPTIDE: B Natriuretic Peptide: 36.4 pg/mL (ref 0.0–100.0)

## 2021-06-26 MED ORDER — ONDANSETRON 4 MG PO TBDP
4.0000 mg | ORAL_TABLET | Freq: Three times a day (TID) | ORAL | Status: DC | PRN
  Administered 2021-06-26 – 2021-06-30 (×7): 4 mg via ORAL
  Filled 2021-06-26 (×8): qty 1

## 2021-06-26 MED ORDER — ONDANSETRON HCL 4 MG/2ML IJ SOLN
4.0000 mg | Freq: Once | INTRAMUSCULAR | Status: AC
  Administered 2021-06-26: 4 mg via INTRAVENOUS
  Filled 2021-06-26: qty 2

## 2021-06-26 MED ORDER — CEFADROXIL 500 MG PO CAPS
500.0000 mg | ORAL_CAPSULE | Freq: Two times a day (BID) | ORAL | Status: AC
  Administered 2021-06-26 – 2021-06-29 (×8): 500 mg via ORAL
  Filled 2021-06-26 (×8): qty 1

## 2021-06-26 MED ORDER — DEXAMETHASONE SODIUM PHOSPHATE 10 MG/ML IJ SOLN
8.0000 mg | INTRAMUSCULAR | Status: DC
Start: 2021-06-26 — End: 2021-06-27
  Administered 2021-06-26: 8 mg via INTRAVENOUS
  Filled 2021-06-26: qty 1

## 2021-06-26 MED ORDER — IPRATROPIUM-ALBUTEROL 0.5-2.5 (3) MG/3ML IN SOLN
3.0000 mL | Freq: Two times a day (BID) | RESPIRATORY_TRACT | Status: DC
  Administered 2021-06-26 – 2021-06-27 (×3): 3 mL via RESPIRATORY_TRACT
  Filled 2021-06-26 (×8): qty 3

## 2021-06-26 MED ORDER — DOXYCYCLINE HYCLATE 100 MG PO TABS
100.0000 mg | ORAL_TABLET | Freq: Two times a day (BID) | ORAL | Status: AC
  Administered 2021-06-26 – 2021-06-30 (×8): 100 mg via ORAL
  Filled 2021-06-26 (×8): qty 1

## 2021-06-26 NOTE — Progress Notes (Addendum)
PROGRESS NOTE                                                                                                                                                                                                             Patient Demographics:    Frances Ball, is a 31 y.o. female, DOB - 09-30-1990, NGE:[id number]  Outpatient Primary MD for the patient is Pcp, No    LOS - 2  Admit date - 06/24/2021    No chief complaint on file.      Brief Narrative (HPI from H&P)   31 y.o. female with medical history significant of HIV on HAART, protein calorie malnutrition, COPD, bronchiectasis, chronic microcytic anemia, presented with increasing of productive cough, shortness of breath, night sweats, weight loss, diarrhea.  Did with the diagnosis of HIV, pneumonia and unintentional weight loss along with AKI.   Subjective:   Patient in bed denies any headache, no fever chills, no chest pain but positive cough, no shortness of breath, no abdominal pain, diarrhea has improved.   Assessment  & Plan :    Multilobar pneumonia in a patient with HIV positive for rhinovirus and MRSA nasal PCR positive as well.  He definitely has some component of rhinoviral pneumonia, overlapping bacterial pneumonia cannot be ruled out, is relatively low CD4 count, ID on board, currently on broad-spectrum antibiotics which include vancomycin, cefepime, doxycycline and dapsone.  Not hypoxic since do not think this is a serious PCP pneumonia.  CT chest appears nonacute, cryptococcal antigen negative, pending final sputum Gram stain culture. Case discussed with ID they will be  seeing the patient shortly.  2.  History of diarrhea present on admission.  GI pathogen panel ordered.  ID to follow.  3.  History of COPD.  Mild wheezing upon admission, supportive care with nebulizer treatment, short trial of steroids started on admission will be continued as in #8 below.   Wheezing has improved.  4.  History of bronchiectasis.  Management as a #1 above, CT chest noted.  5.  History of microcytic anemia.  Acute on chronic, no signs of active bleeding, iron replacement orally, will expect some fall with heme dilution caused by IV fluids, patient has consented for transfusion if needed.  6.  Severe protein calorie malnutrition.  Placed on protein supplementation.  7.  HIV.  On Biktarvy,  ID to follow.  Patient states she is compliant with treatment and medications, however records state that compliance is intermittent at best.  8.  Relative adrenal insufficiency with baseline cortisol level low in the setting of hypotension, she is already on steroid helps cosyntropin stimulation test would be misleading.  Continue steroid for now monitor blood pressure closely.      Condition - Extremely Guarded  Family Communication  :  mother bedside on 06/25/2021, mother over the phone on 06/26/2021  Code Status : Full  Consults  : ID  PUD Prophylaxis :    Procedures  :     R Arm [facility] at [facility] on 06/22/2021.    CT scan chest, abdomen and pelvis. Multifocal pneumonia, right lower lobe predominant, unchanged. Mildly thick-walled bladder, correlate for cystitis.      Disposition Plan  :    Status is: Inpatient  DVT Prophylaxis  :    heparin injection 5,000 Units Start: 06/25/21 1400 SCDs Start: 06/24/21 1938    Lab Results  Component Value Date   PLT 487 (H) 06/26/2021    Diet :  Diet Order             Diet regular Room service appropriate? Yes; Fluid consistency: Thin  Diet effective now                    Inpatient Medications  Scheduled Meds:  (feeding supplement) PROSource Plus  30 mL Oral BID BM   acidophilus  1 capsule Oral TID AC   bictegravir-emtricitabine-tenofovir AF  1 tablet Oral Daily   budesonide (PULMICORT) nebulizer solution  0.25 mg Nebulization BID   Chlorhexidine Gluconate Cloth  6 each Topical Daily    dapsone  100 mg Oral Daily   dexamethasone (DECADRON) injection  8 mg Intravenous Q24H   doxycycline  100 mg Oral Q12H   feeding supplement  237 mL Oral BID BM   ferrous sulfate  325 mg Oral BID WC   guaiFENesin  1,200 mg Oral BID   heparin injection (subcutaneous)  5,000 Units Subcutaneous Q8H   ipratropium-albuterol  3 mL Nebulization TID   Continuous Infusions:  sodium chloride 50 mL/hr at 06/26/21 0330   cefTRIAXone (ROCEPHIN)  IV 2 g (06/25/21 1322)   diphenhydrAMINE     PRN Meds:.albuterol, diphenhydrAMINE, sodium chloride flush     Time Spent in minutes  30   Susa Raring M.D on 06/26/2021 at 9:56 AM  To page go to [email or web address]   [facility] -  Office  [phone]  See all Orders from today for further details    Objective:   Vitals:   06/26/21 0309 06/26/21 0342 06/26/21 0736 06/26/21 0847  BP: 93/66  98/72 98/70  Pulse: 60 62 80 (!) 109  Resp: 15 14 10  (!) 23  Temp:  97.9 F (36.6 C)  98 F (36.7 C)  TempSrc:  Oral  Oral  SpO2: 93% 95% 96% 92%    Wt Readings from Last 3 Encounters:  04/17/21 50.3 kg  09/27/20 51 kg  03/14/20 56.2 kg     Intake/Output Summary (Last 24 hours) at 06/26/2021 06/28/2021 Last data filed at 06/26/2021 06/28/2021 Gross per 24 hour  Intake 246.35 ml  Output --  Net 246.35 ml     Physical Exam  Frail cachectic Caucasian female sitting up in hospital bed in no distress, mild cough, R Arm PICC [place].AT,PERRAL Supple Neck, No JVD,   Symmetrical Chest wall movement, Good air movement  bilaterally, CTAB RRR,No Gallops, Rubs or new Murmurs,  +ve B.Sounds, Abd Soft, No tenderness,   No Cyanosis, Clubbing or edema       Data Review:    CBC Recent Labs  Lab 06/25/21 0343 06/26/21 0445  WBC 8.4 6.9  HGB 7.6* 7.6*  HCT 23.8* 23.9*  PLT 445* 487*  MCV 81.5 81.8  MCH 26.0 26.0  MCHC 31.9 31.8  RDW 17.0* 17.1*  LYMPHSABS  --  1.1  MONOABS  --  0.4  EOSABS  --  0.0  BASOSABS  --  0.0    Electrolytes Recent Labs   Lab 06/24/21 2107 06/25/21 0343 06/25/21 0817 06/26/21 0445 06/26/21 0502  NA 136 137  --  137  --   K 3.3* 3.9  --  3.6  --   CL 102 105  --  105  --   CO2 25 25  --  26  --   GLUCOSE 145* 99  --  97  --   BUN 16 15  --  7  --   CREATININE 1.13* 1.08*  --  0.94  --   CALCIUM 8.3* 8.1*  --  8.5*  --   AST  --  15  --  13*  --   ALT  --  8  --  8  --   ALKPHOS  --  33*  --  36*  --   BILITOT  --  0.4  --  0.6  --   ALBUMIN  --  2.1*  --  2.2*  --   MG  --  2.1  --  2.3  --   CRP  --   --  7.3* 6.2*  --   PROCALCITON 0.31 0.21  --  0.11  --   TSH  --   --   --   --  1.393  BNP  --   --  16.9  --  36.4    ------------------------------------------------------------------------------------------------------------------ No results for input(s): CHOL, HDL, LDLCALC, TRIG, CHOLHDL, LDLDIRECT in the last 72 hours.  No results found for: HGBA1C  Recent Labs    06/26/21 0502  TSH 1.393   ------------------------------------------------------------------------------------------------------------------ ID Labs Recent Labs  Lab 06/24/21 2107 06/25/21 0343 06/25/21 0817 06/26/21 0445  WBC  --  8.4  --  6.9  PLT  --  445*  --  487*  CRP  --   --  7.3* 6.2*  PROCALCITON 0.31 0.21  --  0.11  CREATININE 1.13* 1.08*  --  0.94   Cardiac Enzymes No results for input(s): CKMB, TROPONINI, MYOGLOBIN in the last 168 hours.  Invalid input(s): CK   Micro Results Recent Results (from the past 240 hour(s))  MRSA Next Gen by PCR, Nasal     Status: Abnormal   Collection Time: 06/25/21  9:02 AM   Specimen: Nasal Mucosa; Nasal Swab  Result Value Ref Range Status   MRSA by PCR Next Gen DETECTED (A) NOT DETECTED Final    Comment: RESULT CALLED TO, READ BACK BY AND VERIFIED WITH: RN J. KEFFER [phone]  FH (NOTE) The GeneXpert MRSA Assay (FDA approved for NASAL specimens only), is one component of a comprehensive MRSA colonization surveillance program. It is not intended to  diagnose MRSA infection nor to guide or monitor treatment for MRSA infections. Test performance is not FDA approved in patients less than 40 years old. Performed at [facility], [address] [place], [place] [address]   Respiratory (~20 pathogens)  panel by PCR     Status: Abnormal   Collection Time: 06/25/21 11:21 AM   Specimen: Nasopharyngeal Swab; Respiratory  Result Value Ref Range Status   Adenovirus NOT DETECTED NOT DETECTED Final   Coronavirus 229E NOT DETECTED NOT DETECTED Final    Comment: (NOTE) The Coronavirus on the Respiratory Panel, DOES NOT test for the novel  Coronavirus (2019 nCoV)    Coronavirus HKU1 NOT DETECTED NOT DETECTED Final   Coronavirus NL63 NOT DETECTED NOT DETECTED Final   Coronavirus OC43 NOT DETECTED NOT DETECTED Final   Metapneumovirus NOT DETECTED NOT DETECTED Final   Rhinovirus / Enterovirus DETECTED (A) NOT DETECTED Final   Influenza A NOT DETECTED NOT DETECTED Final   Influenza B NOT DETECTED NOT DETECTED Final   Parainfluenza Virus 1 NOT DETECTED NOT DETECTED Final   Parainfluenza Virus 2 NOT DETECTED NOT DETECTED Final   Parainfluenza Virus 3 NOT DETECTED NOT DETECTED Final   Parainfluenza Virus 4 NOT DETECTED NOT DETECTED Final   Respiratory Syncytial Virus NOT DETECTED NOT DETECTED Final   Bordetella pertussis NOT DETECTED NOT DETECTED Final   Bordetella Parapertussis NOT DETECTED NOT DETECTED Final   Chlamydophila pneumoniae NOT DETECTED NOT DETECTED Final   Mycoplasma pneumoniae NOT DETECTED NOT DETECTED Final    Comment: Performed at [facility], [address] [place], [place] [address]    Radiology Reports CT CHEST ABDOMEN PELVIS W CONTRAST  Result Date: 06/25/2021 CLINICAL DATA:  Pneumonia, HIV, bronchiectasis EXAM: CT CHEST, ABDOMEN, AND PELVIS WITH CONTRAST TECHNIQUE: Multidetector CT imaging of the chest, abdomen and pelvis was performed following the standard protocol during bolus administration of intravenous  contrast. RADIATION DOSE REDUCTION: This exam was performed according to the departmental dose-optimization program which includes automated exposure control, adjustment of the mA and/or kV according to patient size and/or use of iterative reconstruction technique. CONTRAST:  100mL OMNIPAQUE IOHEXOL 300 MG/ML  SOLN COMPARISON:  Outside hospital [facility] CTA chest dated 06/23/2021. CT abdomen/pelvis dated 09/28/2019. FINDINGS: CT CHEST FINDINGS Cardiovascular: Heart is normal in size.  No pericardial effusion. No evidence of thoracic aortic aneurysm. Right arm PICC terminates at the cavoatrial junction. Mediastinum/Nodes: No suspicious mediastinal lymphadenopathy. Visualized thyroid is unremarkable. Lungs/Pleura: Right middle and bilateral lower lobe pneumonia, right lower lobe predominant, with air bronchograms and bronchiectasis. This is unchanged from recent CT. No suspicious pulmonary nodules. No pleural effusion or pneumothorax. Musculoskeletal: Visualized osseous structures are within normal limits. CT ABDOMEN PELVIS FINDINGS Hepatobiliary: Liver is within normal limits. Gallbladder is unremarkable. No intrahepatic or extrahepatic ductal dilatation. Pancreas: Within normal limits. Spleen: Within normal limits. Adrenals/Urinary Tract: Adrenal glands are within normal limits. Right upper pole cortical atrophy with two upper pole cysts (series 3/images 62 and 64). Left kidney is within normal limits. No hydronephrosis. Mildly thick-walled bladder. 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 No evidence of bowel obstruction. Appendix is not discretely visualized. No colonic wall thickening or inflammatory changes. Vascular/Lymphatic: No evidence of abdominal aortic aneurysm. No suspicious abdominopelvic lymphadenopathy. Reproductive: Uterus is within normal limits. Bilateral ovaries are within normal limits. Other: No abdominopelvic ascites.  Musculoskeletal: Visualized osseous structures are within normal limits. IMPRESSION: Multifocal pneumonia, right lower lobe predominant, unchanged. Mildly thick-walled bladder, correlate for cystitis. Additional ancillary findings as above. Electronically Signed   By: Charline BillsSriyesh  Krishnan M.D.   On: 06/25/2021 18:58   DG Chest Port 1 View  Result Date: 06/25/2021 CLINICAL DATA:  Shortness of breath and cough with right  chest pain EXAM: PORTABLE CHEST 1 VIEW COMPARISON:  10/27/2016 FINDINGS: Infiltrate at the right more than left base, stable from CT yesterday. No effusion or pneumothorax. Normal heart size and mediastinal contours accounting for rotation. Right PICC in good position. IMPRESSION: Unchanged right more than left infiltrate in the lower lungs. Electronically Signed   By: Tiburcio Pea M.D.   On: 06/25/2021 07:08

## 2021-06-26 NOTE — Progress Notes (Signed)
[facility]  Date of Admission:  06/24/2021      Total days of antibiotics 2            ASSESSMENT: Frances Ball is a 31 y.o. 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 Also with significant weight loss over the last 2-3 weeks of 15#.   Multifocal PNA / Bronchiectasis - RVP (+) for rhinovirus, will still cover for superimposed CAP with doxy + cefdinir to complete 7d course. Non-hypoxic, still with productive coughing. May need more support with airway clearance for bronchiectasis management with continuation outpatient. AFB collected and pending to determine if MAC or other NTM playing a role.  Very low suspicion for PJP - DFA smear re-ordered as this has yet to be collected. Anticipate that she can discharge home soon.   Hepatitis C - redrawn rna to determine cure. Pending. LFTs normal   Weight loss - contrasted CT C/A/P unrevealing for any malignancy, lymphadenopathy or explanation. Will order daily weights. Would consider nutritional consultation for diet recall and optimization. Possible with advanced HIV to be the cause, however if she has been taking ARVs would expect this to correct. HIV RNA pending. TSH normal.   HIV treatment to continue with Dapsone prophylaxis and Biktarvy. HIV rna pending. Droplet precautions / masking d/t low CD4 count.   Diarrhea - no liquid BMs > 48h and stool today was formed. D/C GI pathogen panel and enteric precautions.     PLAN: Cefdinir + Doxycyline to complete 7d course  Zofran 72m IV x 1 for nausea, ODT PO PRN after  Move doxy to be taken with meals for better tolerance Follow pending labs  Please collect sputum for DFA stain  D/C GI pathogen panel and enteric precautions.   Principal Problem:   PNA (pneumonia) Active Problems:   Bronchiectasis (HCC)   Lobar pneumonia (HCC)    (feeding supplement) PROSource Plus  30 mL Oral BID BM    acidophilus  1 capsule Oral TID AC   bictegravir-emtricitabine-tenofovir AF  1 tablet Oral Daily   budesonide (PULMICORT) nebulizer solution  0.25 mg Nebulization BID   cefadroxil  500 mg Oral BID   Chlorhexidine Gluconate Cloth  6 each Topical Daily   dapsone  100 mg Oral Daily   dexamethasone (DECADRON) injection  8 mg Intravenous Q24H   doxycycline  100 mg Oral BID WC   feeding supplement  237 mL Oral BID BM   ferrous sulfate  325 mg Oral BID WC   guaiFENesin  1,200 mg Oral BID   heparin injection (subcutaneous)  5,000 Units Subcutaneous Q8H   ipratropium-albuterol  3 mL Nebulization TID   ondansetron (ZOFRAN) IV  4 mg Intravenous Once    SUBJECTIVE: Feels about the same, overall OK. She is tolerating the antibiotics well but has noticed some nausea after the morning dose of doxycycline - given to her on an empty stomach.  Had 1 formed BM today. No diarrhea > 48h.  Good appetite.    Review of Systems: Review of Systems  Constitutional:  Negative for chills and fever.  HENT:  Negative for congestion, sinus pain and sore throat.   Eyes:  Negative for blurred vision.  Respiratory:  Positive for cough and sputum production. Negative for shortness of breath.   Cardiovascular:  Negative for chest pain and leg swelling.  Gastrointestinal: Negative.   Genitourinary: Negative.   Musculoskeletal:  Negative.   Skin:  Negative for itching and rash.  Neurological: Negative.    Allergies  Allergen Reactions   Levaquin [Levofloxacin In D5w] Shortness Of Breath and Rash   Zithromax [Azithromycin] Shortness Of Breath, Swelling and Other (See Comments)    Pt reports tightness in skin with redness    Bactrim [Sulfamethoxazole-Trimethoprim] Swelling and Other (See Comments)    Pt reports that she was placed on Bactrim for pneumonia when she was d/c from [facility] and developed a red face with swelling and can not tolerate Bactrim.    Doxycycline Other (See Comments)    Stomach  cramps   Sulfa Antibiotics Swelling   Vancomycin Other (See Comments)    Intolerance, pt states that as long as med is administered with Benadryl its tolarable   Darunavir Rash    OBJECTIVE: Vitals:   06/26/21 0309 06/26/21 0342 06/26/21 0736 06/26/21 0847  BP: 93/66  98/72 98/70  Pulse: 60 62 80 (!) 109  Resp: _0 (!) 23  Temp:  97.9 F (36.6 C)  98 F (36.7 C)  TempSrc:  Oral  Oral  SpO2: 93% 95% 96% 92%   There is no height or weight on file to calculate BMI.  Physical Exam Vitals reviewed.  Constitutional:      Appearance: Normal appearance. She is not ill-appearing.  HENT:     Mouth/Throat:     Mouth: Mucous membranes are moist.     Pharynx: Oropharynx is clear. No oropharyngeal exudate.  Eyes:     General: No scleral icterus.    Pupils: Pupils are equal, round, and reactive to light.  Cardiovascular:     Rate and Rhythm: Normal rate and regular rhythm.     Heart sounds: No murmur heard. Pulmonary:     Effort: Pulmonary effort is normal.     Breath sounds: Rhonchi present.  Abdominal:     General: Bowel sounds are normal.     Palpations: Abdomen is soft.  Musculoskeletal:        General: Normal range of motion.  Skin:    General: Skin is warm and dry.  Neurological:     Mental Status: She is alert.    Lab Results Lab Results  Component Value Date   WBC 6.9 06/26/2021   HGB 7.6 (L) 06/26/2021   HCT 23.9 (L) 06/26/2021   MCV 81.8 06/26/2021   PLT 487 (H) 06/26/2021    Lab Results  Component Value Date   CREATININE 0.94 06/26/2021   BUN 7 06/26/2021   NA 137 06/26/2021   K 3.6 06/26/2021   CL 105 06/26/2021   CO2 26 06/26/2021    Lab Results  Component Value Date   ALT 8 06/26/2021   AST 13 (L) 06/26/2021   GGT 12 10/07/2017   ALKPHOS 36 (L) 06/26/2021   BILITOT 0.6 06/26/2021     Microbiology: Recent Results (from the past 240 hour(s))  MRSA Next Gen by PCR, Nasal     Status: Abnormal   Collection Time: 06/25/21  9:02 AM    Specimen: Nasal Mucosa; Nasal Swab  Result Value Ref Range Status   MRSA by PCR Next Gen DETECTED (A) NOT DETECTED Final    Comment: RESULT CALLED TO, READ BACK BY AND VERIFIED WITH: RN J. KEFFER [phone] _1  FH (NOTE) The GeneXpert MRSA Assay (FDA approved for NASAL specimens only), is one component of a comprehensive MRSA colonization surveillance program. It is not intended to diagnose MRSA infection nor to guide  or monitor treatment for MRSA infections. Test performance is not FDA approved in patients less than 70 years old. Performed at [facility], [address], [place], [place] [address]   Respiratory (~20 pathogens) panel by PCR     Status: Abnormal   Collection Time: 06/25/21 11:21 AM   Specimen: Nasopharyngeal Swab; Respiratory  Result Value Ref Range Status   Adenovirus NOT DETECTED NOT DETECTED Final   Coronavirus 229E NOT DETECTED NOT DETECTED Final    Comment: (NOTE) The Coronavirus on the Respiratory Panel, DOES NOT test for the novel  Coronavirus (2019 nCoV)    Coronavirus HKU1 NOT DETECTED NOT DETECTED Final   Coronavirus NL63 NOT DETECTED NOT DETECTED Final   Coronavirus OC43 NOT DETECTED NOT DETECTED Final   Metapneumovirus NOT DETECTED NOT DETECTED Final   Rhinovirus / Enterovirus DETECTED (A) NOT DETECTED Final   Influenza A NOT DETECTED NOT DETECTED Final   Influenza B NOT DETECTED NOT DETECTED Final   Parainfluenza Virus 1 NOT DETECTED NOT DETECTED Final   Parainfluenza Virus 2 NOT DETECTED NOT DETECTED Final   Parainfluenza Virus 3 NOT DETECTED NOT DETECTED Final   Parainfluenza Virus 4 NOT DETECTED NOT DETECTED Final   Respiratory Syncytial Virus NOT DETECTED NOT DETECTED Final   Bordetella pertussis NOT DETECTED NOT DETECTED Final   Bordetella Parapertussis NOT DETECTED NOT DETECTED Final   Chlamydophila pneumoniae NOT DETECTED NOT DETECTED Final   Mycoplasma pneumoniae NOT DETECTED NOT DETECTED Final    Comment: Performed at [facility], [address], [place], [place] [address]     Janene Madeira, MSN, NP-[facility] [email or web address] Pager: [phone] Office: [phone] [facility] Main Line: [employer] Welcome

## 2021-06-26 NOTE — Plan of Care (Signed)
°  Problem: Clinical Measurements: °Goal: Ability to maintain clinical measurements within normal limits will improve °Outcome: Progressing °Goal: Will remain free from infection °Outcome: Progressing °Goal: Respiratory complications will improve °Outcome: Progressing °Goal: Cardiovascular complication will be avoided °Outcome: Progressing °  °Problem: Nutrition: °Goal: Adequate nutrition will be maintained °Outcome: Progressing °  °

## 2021-06-27 ENCOUNTER — Other Ambulatory Visit: Payer: Self-pay

## 2021-06-27 ENCOUNTER — Encounter (HOSPITAL_COMMUNITY): Payer: Self-pay | Admitting: Internal Medicine

## 2021-06-27 ENCOUNTER — Other Ambulatory Visit (HOSPITAL_COMMUNITY): Payer: Self-pay

## 2021-06-27 DIAGNOSIS — J479 Bronchiectasis, uncomplicated: Secondary | ICD-10-CM | POA: Diagnosis not present

## 2021-06-27 DIAGNOSIS — B20 Human immunodeficiency virus [HIV] disease: Secondary | ICD-10-CM | POA: Diagnosis not present

## 2021-06-27 DIAGNOSIS — J181 Lobar pneumonia, unspecified organism: Secondary | ICD-10-CM | POA: Diagnosis not present

## 2021-06-27 DIAGNOSIS — J189 Pneumonia, unspecified organism: Secondary | ICD-10-CM | POA: Diagnosis not present

## 2021-06-27 LAB — CBC WITH DIFFERENTIAL/PLATELET
Abs Immature Granulocytes: 0.12 10*3/uL — ABNORMAL HIGH (ref 0.00–0.07)
Basophils Absolute: 0 10*3/uL (ref 0.0–0.1)
Basophils Relative: 0 %
Eosinophils Absolute: 0 10*3/uL (ref 0.0–0.5)
Eosinophils Relative: 0 %
HCT: 24.6 % — ABNORMAL LOW (ref 36.0–46.0)
Hemoglobin: 7.8 g/dL — ABNORMAL LOW (ref 12.0–15.0)
Immature Granulocytes: 2 %
Lymphocytes Relative: 12 %
Lymphs Abs: 0.8 10*3/uL (ref 0.7–4.0)
MCH: 26.1 pg (ref 26.0–34.0)
MCHC: 31.7 g/dL (ref 30.0–36.0)
MCV: 82.3 fL (ref 80.0–100.0)
Monocytes Absolute: 0.3 10*3/uL (ref 0.1–1.0)
Monocytes Relative: 4 %
Neutro Abs: 5.8 10*3/uL (ref 1.7–7.7)
Neutrophils Relative %: 82 %
Platelets: 481 10*3/uL — ABNORMAL HIGH (ref 150–400)
RBC: 2.99 MIL/uL — ABNORMAL LOW (ref 3.87–5.11)
RDW: 17.3 % — ABNORMAL HIGH (ref 11.5–15.5)
WBC: 7 10*3/uL (ref 4.0–10.5)
nRBC: 0 % (ref 0.0–0.2)

## 2021-06-27 LAB — COMPREHENSIVE METABOLIC PANEL
ALT: 8 U/L (ref 0–44)
AST: 12 U/L — ABNORMAL LOW (ref 15–41)
Albumin: 2.4 g/dL — ABNORMAL LOW (ref 3.5–5.0)
Alkaline Phosphatase: 39 U/L (ref 38–126)
Anion gap: 5 (ref 5–15)
BUN: 8 mg/dL (ref 6–20)
CO2: 26 mmol/L (ref 22–32)
Calcium: 8.6 mg/dL — ABNORMAL LOW (ref 8.9–10.3)
Chloride: 105 mmol/L (ref 98–111)
Creatinine, Ser: 0.96 mg/dL (ref 0.44–1.00)
GFR, Estimated: >60 mL/min (ref >60)
Glucose, Bld: 117 mg/dL — ABNORMAL HIGH (ref 70–99)
Potassium: 4.1 mmol/L (ref 3.5–5.1)
Sodium: 136 mmol/L (ref 135–145)
Total Bilirubin: 0.3 mg/dL (ref 0.3–1.2)
Total Protein: 6 g/dL — ABNORMAL LOW (ref 6.5–8.1)

## 2021-06-27 LAB — C-REACTIVE PROTEIN: CRP: 4.2 mg/dL — ABNORMAL HIGH (ref <1.0)

## 2021-06-27 LAB — MAGNESIUM: Magnesium: 2.2 mg/dL (ref 1.7–2.4)

## 2021-06-27 LAB — HIV-1 RNA QUANT-NO REFLEX-BLD
HIV 1 RNA Quant: <20 copies/mL
LOG10 HIV-1 RNA: UNDETERMINED log10copy/mL

## 2021-06-27 LAB — BRAIN NATRIURETIC PEPTIDE: B Natriuretic Peptide: 44.3 pg/mL (ref 0.0–100.0)

## 2021-06-27 LAB — HCV RNA QUANT: HCV Quantitative: NOT DETECTED IU/mL (ref >50)

## 2021-06-27 MED ORDER — DEXAMETHASONE 4 MG PO TABS
4.0000 mg | ORAL_TABLET | Freq: Every day | ORAL | Status: DC
  Administered 2021-06-27 – 2021-06-29 (×3): 4 mg via ORAL
  Filled 2021-06-27 (×5): qty 1

## 2021-06-27 MED ORDER — BIKTARVY 50-200-25 MG PO TABS
1.0000 | ORAL_TABLET | Freq: Every day | ORAL | 0 refills | Status: DC
  Filled 2021-06-27: qty 30, 30d supply, fill #0

## 2021-06-27 MED ORDER — DAPSONE 100 MG PO TABS
100.0000 mg | ORAL_TABLET | Freq: Every day | ORAL | 0 refills | Status: DC
  Filled 2021-06-27: qty 30, 30d supply, fill #0

## 2021-06-27 NOTE — Progress Notes (Signed)
PROGRESS NOTE                                                                                                                                                                                                             Patient Demographics:    Frances Ball, is a 31 y.o. female, DOB - February 10, 1990, KGY:[id number]  Outpatient Primary MD for the patient is Pcp, No    LOS - 3  Admit date - 06/24/2021    No chief complaint on file.      Brief Narrative (HPI from H&P)   31 y.o. female with medical history significant of HIV on HAART, protein calorie malnutrition, COPD, bronchiectasis, chronic microcytic anemia, presented with increasing of productive cough, shortness of breath, night sweats, weight loss, diarrhea.  Did with the diagnosis of HIV, pneumonia and unintentional weight loss along with AKI.   Subjective:   Patient in bed, appears comfortable, denies any headache, no fever, no chest pain or pressure, no shortness of breath , no abdominal pain. No new focal weakness.   Assessment  & Plan :    Multilobar pneumonia in a patient with HIV positive for rhinovirus and MRSA nasal PCR positive as well.  She definitely has some component of rhinoviral pneumonia, overlapping bacterial pneumonia cannot be ruled out, has relatively low CD4 count, ID on board, currently on broad-spectrum antibiotics which include Cefdinir, Doxycyline and Dapsone.  Not hypoxic with stable LDH, hence do not think this is a serious PCP pneumonia.  CT chest appears nonacute, cryptococcal antigen negative, pending final sputum Gram stain culture & Fungitell . Case discussed with ID they will be  seeing the patient shortly.  2.  History of diarrhea present on admission.  GI pathogen panel ordered.  ID to follow.  3.  History of COPD.  Mild wheezing upon admission, supportive care with nebulizer treatment, short trial of steroids started on admission will be  continued as in #8 below.  Wheezing has improved.  4.  History of bronchiectasis.  Management as a #1 above, CT chest noted.  5.  History of microcytic anemia.  Acute on chronic, no signs of active bleeding, iron replacement orally, will expect some fall with heme dilution caused by IV fluids, patient has consented for transfusion if needed.  6.  Severe protein calorie malnutrition.  Placed on protein supplementation.  7.  HIV.  On Biktarvy, ID to follow.  Patient states she is compliant with treatment and medications, however records state that compliance is intermittent at best.  8.  Relative adrenal insufficiency with baseline cortisol level low in the setting of hypotension, she is already on steroid helps cosyntropin stimulation test would be misleading.  Continue steroid for now monitor blood pressure closely.      Condition - Extremely Guarded  Family Communication  :  mother bedside on 06/25/2021, mother over the phone on 06/26/2021  Code Status : Full  Consults  : ID  PUD Prophylaxis :    Procedures  :     R Arm [facility] at [facility] on 06/22/2021.    CT scan chest, abdomen and pelvis. Multifocal pneumonia, right lower lobe predominant, unchanged. Mildly thick-walled bladder, correlate for cystitis.      Disposition Plan  :    Status is: Inpatient  DVT Prophylaxis  :    heparin injection 5,000 Units Start: 06/25/21 1400 SCDs Start: 06/24/21 1938    Lab Results  Component Value Date   PLT 481 (H) 06/27/2021    Diet :  Diet Order             Diet regular Room service appropriate? Yes; Fluid consistency: Thin  Diet effective now                    Inpatient Medications  Scheduled Meds:  (feeding supplement) PROSource Plus  30 mL Oral BID BM   acidophilus  1 capsule Oral TID AC   bictegravir-emtricitabine-tenofovir AF  1 tablet Oral Daily   budesonide (PULMICORT) nebulizer solution  0.25 mg Nebulization BID   cefadroxil  500 mg Oral BID    Chlorhexidine Gluconate Cloth  6 each Topical Daily   dapsone  100 mg Oral Daily   dexamethasone  4 mg Oral Daily   doxycycline  100 mg Oral BID WC   feeding supplement  237 mL Oral BID BM   ferrous sulfate  325 mg Oral BID WC   guaiFENesin  1,200 mg Oral BID   heparin injection (subcutaneous)  5,000 Units Subcutaneous Q8H   ipratropium-albuterol  3 mL Nebulization BID   Continuous Infusions:  diphenhydrAMINE     PRN Meds:.albuterol, diphenhydrAMINE, ondansetron, sodium chloride flush     Time Spent in minutes  30   Susa Raring M.D on 06/27/2021 at 9:43 AM  To page go to [email or web address]   [facility] -  Office  [phone]  See all Orders from today for further details    Objective:   Vitals:   06/26/21 1646 06/26/21 2016 06/27/21 0733 06/27/21 0819  BP: 94/61   (!) 99/51  Pulse: 97 91  74  Resp: Temp: 97.6 F (36.4 C)   98.2 F (36.8 C)  TempSrc: Axillary   Oral  SpO2: 94% 96% 99% 95%    Wt Readings from Last 3 Encounters:  04/17/21 50.3 kg  09/27/20 51 kg  03/14/20 56.2 kg     Intake/Output Summary (Last 24 hours) at 06/27/2021 0943 Last data filed at 06/26/2021 1500 Gross per 24 hour  Intake 360 ml  Output --  Net 360 ml     Physical Exam  Frail cachectic Caucasian female sitting up in hospital bed in no distress, mild cough, R Arm PICC [place].AT,PERRAL Supple Neck, No JVD,   Symmetrical Chest wall movement, Good air movement bilaterally, CTAB RRR,No Gallops, Rubs or  new Murmurs,  +ve B.Sounds, Abd Soft, No tenderness,   No Cyanosis, Clubbing or edema     Data Review:    CBC Recent Labs  Lab 06/25/21 0343 06/26/21 0445 06/27/21 0330  WBC 8.4 6.9 7.0  HGB 7.6* 7.6* 7.8*  HCT 23.8* 23.9* 24.6*  PLT 445* 487* 481*  MCV 81.5 81.8 82.3  MCH 26.0 26.0 26.1  MCHC 31.9 31.8 31.7  RDW 17.0* 17.1* 17.3*  LYMPHSABS  --  1.1 0.8  MONOABS  --  0.4 0.3  EOSABS  --  0.0 0.0  BASOSABS  --  0.0 0.0    Electrolytes Recent  Labs  Lab 06/24/21 2107 06/25/21 0343 06/25/21 0817 06/26/21 0445 06/26/21 0502 06/27/21 0330  NA 136 137  --  137  --  136  K 3.3* 3.9  --  3.6  --  4.1  CL 102 105  --  105  --  105  CO2 25 25  --  26  --  26  GLUCOSE 145* 99  --  97  --  117*  BUN 16 15  --  7  --  8  CREATININE 1.13* 1.08*  --  0.94  --  0.96  CALCIUM 8.3* 8.1*  --  8.5*  --  8.6*  AST  --  15  --  13*  --  12*  ALT  --  8  --  8  --  8  ALKPHOS  --  33*  --  36*  --  39  BILITOT  --  0.4  --  0.6  --  0.3  ALBUMIN  --  2.1*  --  2.2*  --  2.4*  MG  --  2.1  --  2.3  --  2.2  CRP  --   --  7.3* 6.2*  --  4.2*  PROCALCITON 0.31 0.21  --  0.11  --   --   TSH  --   --   --   --  1.393  --   BNP  --   --  16.9  --  36.4 44.3    ------------------------------------------------------------------------------------------------------------------ No results for input(s): CHOL, HDL, LDLCALC, TRIG, CHOLHDL, LDLDIRECT in the last 72 hours.  No results found for: HGBA1C  Recent Labs    06/26/21 0502  TSH 1.393   ------------------------------------------------------------------------------------------------------------------ ID Labs Recent Labs  Lab 06/24/21 2107 06/25/21 0343 06/25/21 0817 06/26/21 0445 06/27/21 0330  WBC  --  8.4  --  6.9 7.0  PLT  --  445*  --  487* 481*  CRP  --   --  7.3* 6.2* 4.2*  PROCALCITON 0.31 0.21  --  0.11  --   CREATININE 1.13* 1.08*  --  0.94 0.96    Radiology Reports CT CHEST ABDOMEN PELVIS W CONTRAST  Result Date: 06/25/2021 CLINICAL DATA:  Pneumonia, HIV, bronchiectasis EXAM: CT CHEST, ABDOMEN, AND PELVIS WITH CONTRAST TECHNIQUE: Multidetector CT imaging of the chest, abdomen and pelvis was performed following the standard protocol during bolus administration of intravenous contrast. RADIATION DOSE REDUCTION: This exam was performed according to the departmental dose-optimization program which includes automated exposure control, adjustment of the mA and/or kV  according to patient size and/or use of iterative reconstruction technique. CONTRAST:  100mL OMNIPAQUE IOHEXOL 300 MG/ML  SOLN COMPARISON:  Outside hospital [facility]) CTA chest dated 06/23/2021. CT abdomen/pelvis dated 09/28/2019. FINDINGS: CT CHEST FINDINGS Cardiovascular: Heart is normal in size.  No pericardial effusion. No evidence of thoracic  aortic aneurysm. Right arm PICC terminates at the cavoatrial junction. Mediastinum/Nodes: No suspicious mediastinal lymphadenopathy. Visualized thyroid is unremarkable. Lungs/Pleura: Right middle and bilateral lower lobe pneumonia, right lower lobe predominant, with air bronchograms and bronchiectasis. This is unchanged from recent CT. No suspicious pulmonary nodules. No pleural effusion or pneumothorax. Musculoskeletal: Visualized osseous structures are within normal limits. CT ABDOMEN PELVIS FINDINGS Hepatobiliary: Liver is within normal limits. Gallbladder is unremarkable. No intrahepatic or extrahepatic ductal dilatation. Pancreas: Within normal limits. Spleen: Within normal limits. Adrenals/Urinary Tract: Adrenal glands are within normal limits. Right upper pole cortical atrophy with two upper pole cysts (series 3/images 62 and 64). Left kidney is within normal limits. No hydronephrosis. Mildly thick-walled bladder. 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 No evidence of bowel obstruction. Appendix is not discretely visualized. No colonic wall thickening or inflammatory changes. Vascular/Lymphatic: No evidence of abdominal aortic aneurysm. No suspicious abdominopelvic lymphadenopathy. Reproductive: Uterus is within normal limits. Bilateral ovaries are within normal limits. Other: No abdominopelvic ascites. Musculoskeletal: Visualized osseous structures are within normal limits. IMPRESSION: Multifocal pneumonia, right lower lobe predominant, unchanged. Mildly thick-walled bladder, correlate for cystitis.  Additional ancillary findings as above. Electronically Signed   By: Charline Bills M.D.   On: 06/25/2021 18:58   DG Chest Port 1 View  Result Date: 06/25/2021 CLINICAL DATA:  Shortness of breath and cough with right chest pain EXAM: PORTABLE CHEST 1 VIEW COMPARISON:  10/27/2016 FINDINGS: Infiltrate at the right more than left base, stable from CT yesterday. No effusion or pneumothorax. Normal heart size and mediastinal contours accounting for rotation. Right PICC in good position. IMPRESSION: Unchanged right more than left infiltrate in the lower lungs. Electronically Signed   By: Tiburcio Pea M.D.   On: 06/25/2021 07:08

## 2021-06-27 NOTE — Plan of Care (Signed)
  Problem: Elimination: Goal: Will not experience complications related to urinary retention Outcome: Progressing   Problem: Safety: Goal: Ability to remain free from injury will improve Outcome: Progressing   

## 2021-06-27 NOTE — Progress Notes (Signed)
[facility] for Infectious [facility]  Date of Admission:  06/24/2021      Total days of antibiotics 3            ASSESSMENT: Frances Ball is a 31 y.o. 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 Also with significant weight loss over the last 2-3 weeks of 15#.   Multifocal PNA / Bronchiectasis - RVP (+) for rhinovirus, will still cover for superimposed CAP with doxy + cefdinir to complete 5d course. Non-hypoxic, still with productive coughing.  Regarding bronchiectasis, she may need more support with airway clearance and bronchial hygiene continuation outpatient. Needs flutter valve at the bedside.  AFB has yet to be collected - please have her collect sample so we can determine if MAC or other non-tuberculosis mycopacterial infection playing a role.  Higher risk for this given altered pulmonary architecture. Very low suspicion for PJP - DFA smear re-ordered as this has yet to be collected. Anticipate that she can discharge home over the weekend.   Hepatitis C - VL is not detected; she has achieved cure!  Weight loss - contrasted CT C/A/P unrevealing for any malignancy, lymphadenopathy or explanation. I weighed her today and she is up 2 lbs.  Unclear as to the process contributing; has been under high stress lately. TSH normal. VL pending. Discussed with Frances Ball and her mom that we may not arrive at an answer for this during inpatient stay but we have ruled out some of the more concerning explanations. Good sign that she is up 2 lbs since admission.   HIV treatment to continue with Dapsone prophylaxis and Biktarvy. HIV rna pending. Droplet precautions / masking d/t low CD4 count.   Diarrhea - Resolved. No liquid BMs > 48h and stool today was formed. D/C GI pathogen panel and enteric precautions.     PLAN: Cefdinir + Doxycyline to complete 5d course  Pending HIV RNA Please collect sputum for DFA stain and AFB prior  to discharge  Continue droplet and contact precautions   Principal Problem:   PNA (pneumonia) Active Problems:   Bronchiectasis (HCC)   Lobar pneumonia (HCC)    (feeding supplement) PROSource Plus  30 mL Oral BID BM   acidophilus  1 capsule Oral TID AC   bictegravir-emtricitabine-tenofovir AF  1 tablet Oral Daily   budesonide (PULMICORT) nebulizer solution  0.25 mg Nebulization BID   cefadroxil  500 mg Oral BID   Chlorhexidine Gluconate Cloth  6 each Topical Daily   dapsone  100 mg Oral Daily   dexamethasone  4 mg Oral Daily   doxycycline  100 mg Oral BID WC   feeding supplement  237 mL Oral BID BM   ferrous sulfate  325 mg Oral BID WC   guaiFENesin  1,200 mg Oral BID   heparin injection (subcutaneous)  5,000 Units Subcutaneous Q8H   ipratropium-albuterol  3 mL Nebulization BID    SUBJECTIVE: Feels about the same, overall OK. She is tolerating the antibiotics well but has noticed some nausea after the morning dose of doxycycline - given to her on an empty stomach.  Had 1 formed BM today. No diarrhea > 48h.  Good appetite.    Review of Systems: Review of Systems  Constitutional:  Negative for chills and fever.  HENT:  Negative for congestion, sinus pain and sore throat.   Eyes:  Negative for blurred vision.  Respiratory:  Positive for cough  and sputum production. Negative for shortness of breath.   Cardiovascular:  Negative for chest pain and leg swelling.  Gastrointestinal: Negative.   Genitourinary: Negative.   Musculoskeletal: Negative.   Skin:  Negative for itching and rash.  Neurological: Negative.    Allergies  Allergen Reactions   Levaquin [Levofloxacin In D5w] Shortness Of Breath and Rash   Zithromax [Azithromycin] Shortness Of Breath, Swelling and Other (See Comments)    Pt reports tightness in skin with redness    Bactrim [Sulfamethoxazole-Trimethoprim] Swelling and Other (See Comments)    Pt reports that she was placed on Bactrim for pneumonia when she  was d/c from [facility] and developed a red face with swelling and can not tolerate Bactrim.    Sulfa Antibiotics Swelling   Vancomycin Other (See Comments)    Intolerance, pt states that as long as med is administered with Benadryl its tolarable   Darunavir Rash    OBJECTIVE: Vitals:   06/26/21 1646 06/26/21 2016 06/27/21 0733 06/27/21 0819  BP: 94/61   (!) 99/51  Pulse: 97 91  74  Resp: _0 Temp: 97.6 F (36.4 C)   98.2 F (36.8 C)  TempSrc: Axillary   Oral  SpO2: 94% 96% 99% 95%   There is no height or weight on file to calculate BMI.  Physical Exam Vitals reviewed.  Constitutional:      Appearance: Normal appearance. She is not ill-appearing.  HENT:     Mouth/Throat:     Mouth: Mucous membranes are moist.     Pharynx: Oropharynx is clear. No oropharyngeal exudate.  Eyes:     General: No scleral icterus.    Pupils: Pupils are equal, round, and reactive to light.  Cardiovascular:     Rate and Rhythm: Normal rate and regular rhythm.     Heart sounds: No murmur heard. Pulmonary:     Effort: Pulmonary effort is normal.     Breath sounds: Rhonchi present.  Abdominal:     General: Bowel sounds are normal.     Palpations: Abdomen is soft.  Musculoskeletal:        General: Normal range of motion.  Skin:    General: Skin is warm and dry.  Neurological:     Mental Status: She is alert.    Lab Results Lab Results  Component Value Date   WBC 7.0 06/27/2021   HGB 7.8 (L) 06/27/2021   HCT 24.6 (L) 06/27/2021   MCV 82.3 06/27/2021   PLT 481 (H) 06/27/2021    Lab Results  Component Value Date   CREATININE 0.96 06/27/2021   BUN 8 06/27/2021   NA 136 06/27/2021   K 4.1 06/27/2021   CL 105 06/27/2021   CO2 26 06/27/2021    Lab Results  Component Value Date   ALT 8 06/27/2021   AST 12 (L) 06/27/2021   GGT 12 10/07/2017   ALKPHOS 39 06/27/2021   BILITOT 0.3 06/27/2021     Microbiology: Recent Results (from the past 240 hour(s))  MRSA Next Gen  by PCR, Nasal     Status: Abnormal   Collection Time: 06/25/21  9:02 AM   Specimen: Nasal Mucosa; Nasal Swab  Result Value Ref Range Status   MRSA by PCR Next Gen DETECTED (A) NOT DETECTED Final    Comment: RESULT CALLED TO, READ BACK BY AND VERIFIED WITH: RN J. KEFFER [id number]  FH (NOTE) The GeneXpert MRSA Assay (FDA approved for NASAL specimens only), is one component of a  comprehensive MRSA colonization surveillance program. It is not intended to diagnose MRSA infection nor to guide or monitor treatment for MRSA infections. Test performance is not FDA approved in patients less than 95 years old. Performed at [facility], [place] [address] [place], [place] [address]   Respiratory (~20 pathogens) panel by PCR     Status: Abnormal   Collection Time: 06/25/21 11:21 AM   Specimen: Nasopharyngeal Swab; Respiratory  Result Value Ref Range Status   Adenovirus NOT DETECTED NOT DETECTED Final   Coronavirus 229E NOT DETECTED NOT DETECTED Final    Comment: (NOTE) The Coronavirus on the Respiratory Panel, DOES NOT test for the novel  Coronavirus (2019 nCoV)    Coronavirus HKU1 NOT DETECTED NOT DETECTED Final   Coronavirus NL63 NOT DETECTED NOT DETECTED Final   Coronavirus OC43 NOT DETECTED NOT DETECTED Final   Metapneumovirus NOT DETECTED NOT DETECTED Final   Rhinovirus / Enterovirus DETECTED (A) NOT DETECTED Final   Influenza A NOT DETECTED NOT DETECTED Final   Influenza B NOT DETECTED NOT DETECTED Final   Parainfluenza Virus 1 NOT DETECTED NOT DETECTED Final   Parainfluenza Virus 2 NOT DETECTED NOT DETECTED Final   Parainfluenza Virus 3 NOT DETECTED NOT DETECTED Final   Parainfluenza Virus 4 NOT DETECTED NOT DETECTED Final   Respiratory Syncytial Virus NOT DETECTED NOT DETECTED Final   Bordetella pertussis NOT DETECTED NOT DETECTED Final   Bordetella Parapertussis NOT DETECTED NOT DETECTED Final   Chlamydophila pneumoniae NOT DETECTED NOT DETECTED Final   Mycoplasma  pneumoniae NOT DETECTED NOT DETECTED Final    Comment: Performed at [facility], [address] [place], [place] [address]     Janene Madeira, MSN, NP-C [facility] [email or web address] Pager: [phone] Office: [phone] [facility] Main Line: [place] Communication Welcome

## 2021-06-28 DIAGNOSIS — J189 Pneumonia, unspecified organism: Secondary | ICD-10-CM | POA: Diagnosis not present

## 2021-06-28 LAB — CBC WITH DIFFERENTIAL/PLATELET
Abs Immature Granulocytes: 0.15 10*3/uL — ABNORMAL HIGH (ref 0.00–0.07)
Basophils Absolute: 0 10*3/uL (ref 0.0–0.1)
Basophils Relative: 0 %
Eosinophils Absolute: 0 10*3/uL (ref 0.0–0.5)
Eosinophils Relative: 0 %
HCT: 24.3 % — ABNORMAL LOW (ref 36.0–46.0)
Hemoglobin: 7.7 g/dL — ABNORMAL LOW (ref 12.0–15.0)
Immature Granulocytes: 2 %
Lymphocytes Relative: 16 %
Lymphs Abs: 1.1 10*3/uL (ref 0.7–4.0)
MCH: 26.5 pg (ref 26.0–34.0)
MCHC: 31.7 g/dL (ref 30.0–36.0)
MCV: 83.5 fL (ref 80.0–100.0)
Monocytes Absolute: 0.3 10*3/uL (ref 0.1–1.0)
Monocytes Relative: 4 %
Neutro Abs: 5.2 10*3/uL (ref 1.7–7.7)
Neutrophils Relative %: 78 %
Platelets: 462 10*3/uL — ABNORMAL HIGH (ref 150–400)
RBC: 2.91 MIL/uL — ABNORMAL LOW (ref 3.87–5.11)
RDW: 17.4 % — ABNORMAL HIGH (ref 11.5–15.5)
WBC: 6.7 10*3/uL (ref 4.0–10.5)
nRBC: 0.3 % — ABNORMAL HIGH (ref 0.0–0.2)

## 2021-06-28 LAB — COMPREHENSIVE METABOLIC PANEL
ALT: 10 U/L (ref 0–44)
AST: 14 U/L — ABNORMAL LOW (ref 15–41)
Albumin: 2.4 g/dL — ABNORMAL LOW (ref 3.5–5.0)
Alkaline Phosphatase: 38 U/L (ref 38–126)
Anion gap: 8 (ref 5–15)
BUN: 12 mg/dL (ref 6–20)
CO2: 24 mmol/L (ref 22–32)
Calcium: 8.4 mg/dL — ABNORMAL LOW (ref 8.9–10.3)
Chloride: 102 mmol/L (ref 98–111)
Creatinine, Ser: 0.94 mg/dL (ref 0.44–1.00)
GFR, Estimated: >60 mL/min (ref >60)
Glucose, Bld: 117 mg/dL — ABNORMAL HIGH (ref 70–99)
Potassium: 3.9 mmol/L (ref 3.5–5.1)
Sodium: 134 mmol/L — ABNORMAL LOW (ref 135–145)
Total Bilirubin: 0.4 mg/dL (ref 0.3–1.2)
Total Protein: 5.8 g/dL — ABNORMAL LOW (ref 6.5–8.1)

## 2021-06-28 LAB — BRAIN NATRIURETIC PEPTIDE: B Natriuretic Peptide: 43.8 pg/mL (ref 0.0–100.0)

## 2021-06-28 LAB — C-REACTIVE PROTEIN: CRP: 1.8 mg/dL — ABNORMAL HIGH (ref <1.0)

## 2021-06-28 LAB — MAGNESIUM: Magnesium: 1.9 mg/dL (ref 1.7–2.4)

## 2021-06-28 MED ORDER — FOLIC ACID 1 MG PO TABS
1.0000 mg | ORAL_TABLET | Freq: Every day | ORAL | Status: DC
  Administered 2021-06-28 – 2021-06-30 (×3): 1 mg via ORAL
  Filled 2021-06-28 (×4): qty 1

## 2021-06-28 MED ORDER — ADULT MULTIVITAMIN W/MINERALS CH
1.0000 | ORAL_TABLET | Freq: Every day | ORAL | Status: DC
  Administered 2021-06-30: 1 via ORAL
  Filled 2021-06-28 (×3): qty 1

## 2021-06-28 MED ORDER — VITAMIN B-12 1000 MCG PO TABS
1000.0000 ug | ORAL_TABLET | Freq: Every day | ORAL | Status: DC
  Administered 2021-06-30: 1000 ug via ORAL
  Filled 2021-06-28 (×3): qty 1

## 2021-06-28 MED ORDER — MUPIROCIN 2 % EX OINT
1.0000 "application " | TOPICAL_OINTMENT | Freq: Two times a day (BID) | CUTANEOUS | Status: DC
  Administered 2021-06-28 – 2021-06-30 (×5): 1 via NASAL
  Filled 2021-06-28: qty 22

## 2021-06-28 MED ORDER — ALTEPLASE 2 MG IJ SOLR
INTRAMUSCULAR | Status: AC
  Administered 2021-06-28: 2 mg
  Filled 2021-06-28: qty 2

## 2021-06-28 MED ORDER — THIAMINE HCL 100 MG PO TABS
100.0000 mg | ORAL_TABLET | Freq: Every day | ORAL | Status: DC
Start: 2021-06-28 — End: 2021-07-01
  Filled 2021-06-28 (×2): qty 1

## 2021-06-28 MED ORDER — ALTEPLASE 2 MG IJ SOLR: 2.0000 mg | Freq: Once | INTRAMUSCULAR | Status: AC

## 2021-06-28 NOTE — Progress Notes (Signed)
Patient refused to take Heparin SQ. Patient educated on medication and why it is ordered. Patient verbalizes understanding but refuses medication. MD Toniann Fail notified.

## 2021-06-28 NOTE — Progress Notes (Signed)
Initial Nutrition Assessment  DOCUMENTATION CODES:    (Unable to assess-reassess on follow-up)  INTERVENTION:   Continue Regular diet  Continue Ensure Enlive po BID, each supplement provides 350 kcal and 20 grams of protein. If pt prefers different supplement, ok to change to any supplement that pt is willing to consume. Discussed with RN  Continue 30 ml ProSource Plus BID, each supplement provides 100 kcals and 15 grams protein.   Add MVI with Minerals daily, Thiamine 100 mg daily x 7 days, Folic Acid 1 mg daily, B12 1779 mcg daily  NUTRITION DIAGNOSIS:   Increased nutrient needs related to catabolic illness, acute illness as evidenced by estimated needs.   GOAL:   Patient will meet greater than or equal to 90% of their needs   MONITOR:   PO intake, Supplement acceptance, Labs, Weight trends  REASON FOR ASSESSMENT:   Malnutrition Screening Tool    ASSESSMENT:   31 yo female admitted with multilobar pneumonia, +weight loss. PMH includes HIV+/AIDS, COPD, microcytic anemia  Noted pt has been refusing some care.   Currently on regular diet; limited documentation of meals. One meal recorded from 5/25 at 100%  Per MAR, pt has refused all Ensure supplements. Pt has taken a few of the Pro-sources but has refused most.   Pt with weight loss; CT C/A/P negative for any malignancy or other explanation. Noted work-up for weight loss has been ongoing as outpatient  Pt on Droplet Precautions/masking due to Low CD4 count  Weight today 49.7 kg; 1st weight since admission. No height for this admission, height previously documented as 5'2" Wt Readings from Last 10 Encounters:  06/28/21 49.7 kg  04/17/21 50.3 kg  09/27/20 51 kg  03/14/20 56.2 kg  01/19/19 54.4 kg  11/22/18 57.6 kg  04/12/18 55.2 kg  03/14/18 54 kg  11/18/17 52.6 kg  07/06/17 48.5 kg    Vitamin Labs (5/24):  Folate: 4.6 (L) Iron: 26 (L) TIBC: 188 (L) B12:  325 (low normal) CRP: 7.3 (5/24)  Labs:  reviewed Meds: decadron, ferrous sulfate,   NUTRITION - FOCUSED PHYSICAL EXAM:  Unable to assess  Diet Order:   Diet Order             Diet regular Room service appropriate? Yes; Fluid consistency: Thin  Diet effective now                   EDUCATION NEEDS:   Not appropriate for education at this time  Skin:  Skin Assessment: Reviewed RN Assessment  Last BM:  5/25  Height:   Ht Readings from Last 1 Encounters:  01/19/19 5\' 2"  (1.575 m)    Weight:   Wt Readings from Last 1 Encounters:  06/28/21 49.7 kg     BMI:  Body mass index is 20.04 kg/m.  Estimated Nutritional Needs:   Kcal:  1800-2000 kcals  Protein:  85-100 g  Fluid:  >/= 1.8 L   06/30/21 MS, RDN, LDN, CNSC Registered Dietitian III Clinical Nutrition RD Pager and On-Call Pager Number Located in [place]

## 2021-06-28 NOTE — Plan of Care (Signed)
  Problem: Health Behavior/Discharge Planning: Goal: Ability to manage health-related needs will improve Outcome: Progressing   

## 2021-06-28 NOTE — Progress Notes (Signed)
PROGRESS NOTE                                                                                                                                                                                                             Patient Demographics:    Frances Ball, is a 31 y.o. female, DOB - 1990-11-07, LKT:[id number]  Outpatient Primary MD for the patient is Pcp, No    LOS - 4  Admit date - 06/24/2021    No chief complaint on file.      Brief Narrative (HPI from H&P)   31 y.o. female with medical history significant of HIV on HAART, protein calorie malnutrition, COPD, bronchiectasis, chronic microcytic anemia, presented with increasing of productive cough, shortness of breath, night sweats, weight loss, diarrhea.  Did with the diagnosis of HIV, pneumonia and unintentional weight loss along with AKI.   Subjective:   Patient in bed, appears comfortable, denies any headache, no fever, no chest pain or pressure, mild cough but no shortness of breath , no abdominal pain. No new focal weakness.   Assessment  & Plan :    Multilobar pneumonia in a patient with HIV positive for rhinovirus and MRSA nasal PCR positive as well.  She definitely has some component of rhinoviral pneumonia, overlapping bacterial pneumonia cannot be ruled out, has relatively low CD4 count but stable viral load, ID on board, currently on broad-spectrum antibiotics which include Cefdinir, Doxycyline and Dapsone.  Not hypoxic with stable LDH, hence do not think this is a serious PCP pneumonia.  CT chest appears nonacute, cryptococcal antigen negative, pending final sputum Gram stain culture AFB & Fungitell . Case discussed with ID on 06/27/21.  2.  History of diarrhea present on admission.  Resolved.  3.  History of COPD.  Mild wheezing upon admission, supportive care with nebulizer treatment, short trial of steroids started on admission will be continued as in #8 below.   Wheezing has improved.  4.  History of bronchiectasis.  Management as a #1 above, CT chest noted.  5.  History of microcytic anemia.  Acute on chronic, no signs of active bleeding, iron replacement orally, will expect some fall with heme dilution caused by IV fluids, patient has consented for transfusion if needed.  6.  Severe protein calorie malnutrition.  Placed on protein supplementation.  Appetite good here, encouraged  to consume protein supplements, outpatient weight loss work-up by PCP.  Stable TSH, stable viral load, nonacute CT chest abdomen pelvis.  7.  HIV.  On Biktarvy, ID to follow.  Patient states she is compliant with treatment and medications, HIV viral load satisfactory, CD4 count somewhat low, continue treatment per ID.  8.  Relative adrenal insufficiency with baseline cortisol level low in the setting of hypotension, she is already on steroid helps cosyntropin stimulation test would be misleading.  Continue steroid for now monitor blood pressure closely.   Patient noncompliant with DVT prophylaxis, counseled also refusing medicated body wipes for MRSA, counseled.       Condition - Extremely Guarded  Family Communication  :  mother bedside on 06/25/2021, mother over the phone on 06/26/2021  Code Status : Full  Consults  : ID  PUD Prophylaxis :    Procedures  :     R Arm PICC placed at [facility] on 06/22/2021.    CT scan chest, abdomen and pelvis. Multifocal pneumonia, right lower lobe predominant, unchanged. Mildly thick-walled bladder, correlate for cystitis.      Disposition Plan  :    Status is: Inpatient  DVT Prophylaxis  :    heparin injection 5,000 Units Start: 06/25/21 1400 SCDs Start: 06/24/21 1938    Lab Results  Component Value Date   PLT 462 (H) 06/28/2021    Diet :  Diet Order             Diet regular Room service appropriate? Yes; Fluid consistency: Thin  Diet effective now                    Inpatient  Medications  Scheduled Meds:  (feeding supplement) PROSource Plus  30 mL Oral BID BM   acidophilus  1 capsule Oral TID AC   bictegravir-emtricitabine-tenofovir AF  1 tablet Oral Daily   budesonide (PULMICORT) nebulizer solution  0.25 mg Nebulization BID   cefadroxil  500 mg Oral BID   Chlorhexidine Gluconate Cloth  6 each Topical Daily   dapsone  100 mg Oral Daily   dexamethasone  4 mg Oral Daily   doxycycline  100 mg Oral BID WC   feeding supplement  237 mL Oral BID BM   ferrous sulfate  325 mg Oral BID WC   guaiFENesin  1,200 mg Oral BID   heparin injection (subcutaneous)  5,000 Units Subcutaneous Q8H   ipratropium-albuterol  3 mL Nebulization BID   mupirocin ointment  1 application. Nasal BID   Continuous Infusions:  diphenhydrAMINE     PRN Meds:.albuterol, diphenhydrAMINE, ondansetron, sodium chloride flush     Time Spent in minutes  30   Susa Raring M.D on 06/28/2021 at 10:36 AM  To page go to [email or web address]   [facility] -  Office  [phone]  See all Orders from today for further details    Objective:   Vitals:   06/27/21 2029 06/27/21 2323 06/28/21 0209 06/28/21 0345  BP:  93/73  99/69  Pulse: 79 83  (!) 55  Resp: Temp:  97.8 F (36.6 C)  97.8 F (36.6 C)  TempSrc:  Oral  Oral  SpO2: 95% 96%  96%  Weight:   49.7 kg     Wt Readings from Last 3 Encounters:  06/28/21 49.7 kg  04/17/21 50.3 kg  09/27/20 51 kg    No intake or output data in the 24 hours ending 06/28/21 1036    Physical  Exam  Frail cachectic Caucasian female sitting up in hospital bed in no distress, mild cough, R Arm PICC [place].AT,PERRAL Supple Neck, No JVD,   Symmetrical Chest wall movement, Good air movement bilaterally, CTAB RRR,No Gallops, Rubs or new Murmurs,  +ve B.Sounds, Abd Soft, No tenderness,   No Cyanosis, Clubbing or edema      Data Review:    CBC Recent Labs  Lab 06/25/21 0343 06/26/21 0445 06/27/21 0330 06/28/21 0249  WBC 8.4 6.9  7.0 6.7  HGB 7.6* 7.6* 7.8* 7.7*  HCT 23.8* 23.9* 24.6* 24.3*  PLT 445* 487* 481* 462*  MCV 81.5 81.8 82.3 83.5  MCH 26.0 26.0 26.1 26.5  MCHC 31.9 31.8 31.7 31.7  RDW 17.0* 17.1* 17.3* 17.4*  LYMPHSABS  --  1.1 0.8 1.1  MONOABS  --  0.4 0.3 0.3  EOSABS  --  0.0 0.0 0.0  BASOSABS  --  0.0 0.0 0.0    Electrolytes Recent Labs  Lab 06/24/21 2107 06/25/21 0343 06/25/21 0817 06/26/21 0445 06/26/21 0502 06/27/21 0330 06/28/21 0249  NA 136 137  --  137  --  136 134*  K 3.3* 3.9  --  3.6  --  4.1 3.9  CL 102 105  --  105  --  105 102  CO2 25 25  --  26  --  26 24  GLUCOSE 145* 99  --  97  --  117* 117*  BUN 16 15  --  7  --  8 12  CREATININE 1.13* 1.08*  --  0.94  --  0.96 0.94  CALCIUM 8.3* 8.1*  --  8.5*  --  8.6* 8.4*  AST  --  15  --  13*  --  12* 14*  ALT  --  8  --  8  --  8 10  ALKPHOS  --  33*  --  36*  --  39 38  BILITOT  --  0.4  --  0.6  --  0.3 0.4  ALBUMIN  --  2.1*  --  2.2*  --  2.4* 2.4*  MG  --  2.1  --  2.3  --  2.2 1.9  CRP  --   --  7.3* 6.2*  --  4.2* 1.8*  PROCALCITON 0.31 0.21  --  0.11  --   --   --   TSH  --   --   --   --  1.393  --   --   BNP  --   --  16.9  --  36.4 44.3 43.8    ------------------------------------------------------------------------------------------------------------------ No results for input(s): CHOL, HDL, LDLCALC, TRIG, CHOLHDL, LDLDIRECT in the last 72 hours.  No results found for: HGBA1C  Recent Labs    06/26/21 0502  TSH 1.393   ------------------------------------------------------------------------------------------------------------------ ID Labs Recent Labs  Lab 06/24/21 2107 06/25/21 0343 06/25/21 0817 06/26/21 0445 06/27/21 0330 06/28/21 0249  WBC  --  8.4  --  6.9 7.0 6.7  PLT  --  445*  --  487* 481* 462*  CRP  --   --  7.3* 6.2* 4.2* 1.8*  PROCALCITON 0.31 0.21  --  0.11  --   --   CREATININE 1.13* 1.08*  --  0.94 0.96 0.94    Radiology Reports CT CHEST ABDOMEN PELVIS W CONTRAST  Result  Date: 06/25/2021 CLINICAL DATA:  Pneumonia, HIV, bronchiectasis EXAM: CT CHEST, ABDOMEN, AND PELVIS WITH CONTRAST TECHNIQUE: Multidetector CT imaging of the chest, abdomen and pelvis  was performed following the standard protocol during bolus administration of intravenous contrast. RADIATION DOSE REDUCTION: This exam was performed according to the departmental dose-optimization program which includes automated exposure control, adjustment of the mA and/or kV according to patient size and/or use of iterative reconstruction technique. CONTRAST:  100mL OMNIPAQUE IOHEXOL 300 MG/ML  SOLN COMPARISON:  Outside hospital [facility]) CTA chest dated 06/23/2021. CT abdomen/pelvis dated 09/28/2019. FINDINGS: CT CHEST FINDINGS Cardiovascular: Heart is normal in size.  No pericardial effusion. No evidence of thoracic aortic aneurysm. Right arm PICC terminates at the cavoatrial junction. Mediastinum/Nodes: No suspicious mediastinal lymphadenopathy. Visualized thyroid is unremarkable. Lungs/Pleura: Right middle and bilateral lower lobe pneumonia, right lower lobe predominant, with air bronchograms and bronchiectasis. This is unchanged from recent CT. No suspicious pulmonary nodules. No pleural effusion or pneumothorax. Musculoskeletal: Visualized osseous structures are within normal limits. CT ABDOMEN PELVIS FINDINGS Hepatobiliary: Liver is within normal limits. Gallbladder is unremarkable. No intrahepatic or extrahepatic ductal dilatation. Pancreas: Within normal limits. Spleen: Within normal limits. Adrenals/Urinary Tract: Adrenal glands are within normal limits. Right upper pole cortical atrophy with two upper pole cysts (series 3/images 62 and 64). Left kidney is within normal limits. No hydronephrosis. Mildly thick-walled bladder. 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 No evidence of bowel obstruction. Appendix is not discretely visualized. No colonic wall  thickening or inflammatory changes. Vascular/Lymphatic: No evidence of abdominal aortic aneurysm. No suspicious abdominopelvic lymphadenopathy. Reproductive: Uterus is within normal limits. Bilateral ovaries are within normal limits. Other: No abdominopelvic ascites. Musculoskeletal: Visualized osseous structures are within normal limits. IMPRESSION: Multifocal pneumonia, right lower lobe predominant, unchanged. Mildly thick-walled bladder, correlate for cystitis. Additional ancillary findings as above. Electronically Signed   By: Charline BillsSriyesh  Krishnan M.D.   On: 06/25/2021 18:58   DG Chest Port 1 View  Result Date: 06/25/2021 CLINICAL DATA:  Shortness of breath and cough with right chest pain EXAM: PORTABLE CHEST 1 VIEW COMPARISON:  10/27/2016 FINDINGS: Infiltrate at the right more than left base, stable from CT yesterday. No effusion or pneumothorax. Normal heart size and mediastinal contours accounting for rotation. Right PICC in good position. IMPRESSION: Unchanged right more than left infiltrate in the lower lungs. Electronically Signed   By: Tiburcio PeaJonathan  Watts M.D.   On: 06/25/2021 07:08

## 2021-06-28 NOTE — Plan of Care (Signed)
  Problem: Health Behavior/Discharge Planning: Goal: Ability to manage health-related needs will improve 06/28/2021 2254 by Saddie Benders, RN Outcome: Progressing 06/28/2021 2143 by Saddie Benders, RN Outcome: Progressing

## 2021-06-28 NOTE — Progress Notes (Signed)
Patient offered bath this AM along with CHG bath. Patient refused both bath and CHG wipes. Patient educated on purpose of CHG wipes against prevention/decreasing rate of infection.  Patient states she will wipe herself later. Nurse asked patient to please call us when she is ready so we may provide her with the necessary materials.

## 2021-06-29 DIAGNOSIS — J189 Pneumonia, unspecified organism: Secondary | ICD-10-CM | POA: Diagnosis not present

## 2021-06-29 MED ORDER — SODIUM CHLORIDE 0.9 % IV BOLUS
250.0000 mL | Freq: Once | INTRAVENOUS | Status: AC
  Administered 2021-06-29: 250 mL via INTRAVENOUS

## 2021-06-29 MED ORDER — DEXAMETHASONE SODIUM PHOSPHATE 4 MG/ML IJ SOLN
4.0000 mg | Freq: Once | INTRAMUSCULAR | Status: AC
  Administered 2021-06-29: 4 mg via INTRAVENOUS
  Filled 2021-06-29: qty 1

## 2021-06-29 NOTE — Progress Notes (Signed)
Patient refused sub Q heparin, despite education and rationale provided. MD Thedore Mins notified.

## 2021-06-29 NOTE — Progress Notes (Signed)
PROGRESS NOTE                                                                                                                                                                                                             Patient Demographics:    Frances Ball, is a 31 y.o. female, DOB - Jul 03, 1990, [id number]  Outpatient Primary MD for the patient is Pcp, No    LOS - 5  Admit date - 06/24/2021    No chief complaint on file.      Brief Narrative (HPI from H&P)   31 y.o. female with medical history significant of HIV on HAART, protein calorie malnutrition, COPD, bronchiectasis, chronic microcytic anemia, presented with increasing of productive cough, shortness of breath, night sweats, weight loss, diarrhea.  Did with the diagnosis of HIV, pneumonia and unintentional weight loss along with AKI.   Subjective:   Patient in bed, appears comfortable, denies any headache, no fever, no chest pain or pressure, no shortness of breath , no abdominal pain. No new focal weakness.    Assessment  & Plan :    Multilobar pneumonia in a patient with HIV positive for rhinovirus and MRSA nasal PCR positive as well.  She definitely has some component of rhinoviral pneumonia, overlapping bacterial pneumonia cannot be ruled out, has relatively low CD4 count but stable viral load, ID on board, currently on broad-spectrum antibiotics which include Cefdinir, Doxycyline and Dapsone.  Not hypoxic with stable LDH, hence do not think this is a serious PCP pneumonia.  CT chest appears nonacute, cryptococcal antigen negative, pending final sputum Gram stain culture AFB & Fungitell . Case discussed with ID on 06/27/21.  Will discuss again on 06/30/2021 on disposition plan.  2.  History of diarrhea present on admission.  Resolved.  3.  History of COPD.  Mild wheezing upon admission, supportive care with nebulizer treatment, short trial of steroids started on  admission will be continued as in #8 below.  Wheezing has improved.  4.  History of bronchiectasis.  Management as a #1 above, CT chest noted.  5.  History of microcytic anemia.  Acute on chronic, no signs of active bleeding, iron replacement orally, will expect some fall with heme dilution caused by IV fluids, patient has consented for transfusion if needed.  6.  Severe protein calorie malnutrition.  Placed on  protein supplementation.  Appetite good here, encouraged to consume protein supplements, outpatient weight loss work-up by PCP.  Stable TSH, stable viral load, nonacute CT chest abdomen pelvis.  7.  HIV.  On Biktarvy, ID to follow.  Patient states she is compliant with treatment and medications, HIV viral load satisfactory, CD4 count somewhat low, continue treatment per ID.  8.  Relative adrenal insufficiency with baseline cortisol level low in the setting of hypotension, she is already on steroid helps cosyntropin stimulation test would be misleading.  Continue steroid for now monitor blood pressure closely.   Patient noncompliant with DVT prophylaxis, counseled also refusing medicated body wipes for MRSA, counseled.       Condition - Extremely Guarded  Family Communication  :  mother bedside on 06/25/2021, mother over the phone on 06/26/2021  Code Status : Full  Consults  : ID  PUD Prophylaxis :    Procedures  :     R Arm PICC placed at [facility] on 06/22/2021.    CT scan chest, abdomen and pelvis. Multifocal pneumonia, right lower lobe predominant, unchanged. Mildly thick-walled bladder, correlate for cystitis.      Disposition Plan  :    Status is: Inpatient  DVT Prophylaxis  :    heparin injection 5,000 Units Start: 06/25/21 1400 SCDs Start: 06/24/21 1938    Lab Results  Component Value Date   PLT 462 (H) 06/28/2021    Diet :  Diet Order             Diet regular Room service appropriate? Yes; Fluid consistency: Thin  Diet effective now                     Inpatient Medications  Scheduled Meds:  (feeding supplement) PROSource Plus  30 mL Oral BID BM   acidophilus  1 capsule Oral TID AC   bictegravir-emtricitabine-tenofovir AF  1 tablet Oral Daily   budesonide (PULMICORT) nebulizer solution  0.25 mg Nebulization BID   cefadroxil  500 mg Oral BID   Chlorhexidine Gluconate Cloth  6 each Topical Daily   dapsone  100 mg Oral Daily   dexamethasone  4 mg Oral Daily   doxycycline  100 mg Oral BID WC   feeding supplement  237 mL Oral BID BM   ferrous sulfate  325 mg Oral BID WC   folic acid  1 mg Oral Daily   guaiFENesin  1,200 mg Oral BID   heparin injection (subcutaneous)  5,000 Units Subcutaneous Q8H   ipratropium-albuterol  3 mL Nebulization BID   multivitamin with minerals  1 tablet Oral Daily   mupirocin ointment  1 application. Nasal BID   thiamine  100 mg Oral Daily   vitamin B-12  1,000 mcg Oral Daily   Continuous Infusions:  diphenhydrAMINE     PRN Meds:.albuterol, diphenhydrAMINE, ondansetron, sodium chloride flush     Time Spent in minutes  30   Susa Raring M.D on 06/29/2021 at 9:05 AM  To page go to [email or web address]   [facility] -  Office  [phone]  See all Orders from today for further details    Objective:   Vitals:   06/28/21 2012 06/28/21 2333 06/29/21 0233 06/29/21 0346  BP:  (!) 85/54  93/61  Pulse: 83 83  83  Resp: Temp:  97.8 F (36.6 C)  97.8 F (36.6 C)  TempSrc:  Oral  Oral  SpO2: 94% 94%    Weight:  48.2 kg     Wt Readings from Last 3 Encounters:  06/29/21 48.2 kg  04/17/21 50.3 kg  09/27/20 51 kg     Intake/Output Summary (Last 24 hours) at 06/29/2021 [id number] Last data filed at 06/28/2021 2200 Gross per 24 hour  Intake 100 ml  Output --  Net 100 ml      Physical Exam  Frail cachectic Caucasian female sitting up in hospital bed in no distress, mild cough, R Arm PICC [place].AT,PERRAL Supple Neck, No JVD,   Symmetrical Chest wall movement,  Good air movement bilaterally, CTAB RRR,No Gallops, Rubs or new Murmurs,  +ve B.Sounds, Abd Soft, No tenderness,   No Cyanosis, Clubbing or edema     Data Review:    CBC Recent Labs  Lab 06/25/21 0343 06/26/21 0445 06/27/21 0330 06/28/21 0249  WBC 8.4 6.9 7.0 6.7  HGB 7.6* 7.6* 7.8* 7.7*  HCT 23.8* 23.9* 24.6* 24.3*  PLT 445* 487* 481* 462*  MCV 81.5 81.8 82.3 83.5  MCH 26.0 26.0 26.1 26.5  MCHC 31.9 31.8 31.7 31.7  RDW 17.0* 17.1* 17.3* 17.4*  LYMPHSABS  --  1.1 0.8 1.1  MONOABS  --  0.4 0.3 0.3  EOSABS  --  0.0 0.0 0.0  BASOSABS  --  0.0 0.0 0.0    Electrolytes Recent Labs  Lab 06/24/21 2107 06/25/21 0343 06/25/21 0817 06/26/21 0445 06/26/21 0502 06/27/21 0330 06/28/21 0249  NA 136 137  --  137  --  136 134*  K 3.3* 3.9  --  3.6  --  4.1 3.9  CL 102 105  --  105  --  105 102  CO2 25 25  --  26  --  26 24  GLUCOSE 145* 99  --  97  --  117* 117*  BUN 16 15  --  7  --  8 12  CREATININE 1.13* 1.08*  --  0.94  --  0.96 0.94  CALCIUM 8.3* 8.1*  --  8.5*  --  8.6* 8.4*  AST  --  15  --  13*  --  12* 14*  ALT  --  8  --  8  --  8 10  ALKPHOS  --  33*  --  36*  --  39 38  BILITOT  --  0.4  --  0.6  --  0.3 0.4  ALBUMIN  --  2.1*  --  2.2*  --  2.4* 2.4*  MG  --  2.1  --  2.3  --  2.2 1.9  CRP  --   --  7.3* 6.2*  --  4.2* 1.8*  PROCALCITON 0.31 0.21  --  0.11  --   --   --   TSH  --   --   --   --  1.393  --   --   BNP  --   --  16.9  --  36.4 44.3 43.8    ------------------------------------------------------------------------------------------------------------------ No results for input(s): CHOL, HDL, LDLCALC, TRIG, CHOLHDL, LDLDIRECT in the last 72 hours.  No results found for: HGBA1C  No results for input(s): TSH, T4TOTAL, T3FREE, THYROIDAB in the last 72 hours.  Invalid input(s): FREET3  ------------------------------------------------------------------------------------------------------------------ ID Labs Recent Labs  Lab 06/24/21 2107  06/25/21 0343 06/25/21 0817 06/26/21 0445 06/27/21 0330 06/28/21 0249  WBC  --  8.4  --  6.9 7.0 6.7  PLT  --  445*  --  487* 481* 462*  CRP  --   --  7.3* 6.2* 4.2* 1.8*  PROCALCITON 0.31 0.21  --  0.11  --   --   CREATININE 1.13* 1.08*  --  0.94 0.96 0.94    Radiology Reports CT CHEST ABDOMEN PELVIS W CONTRAST  Result Date: 06/25/2021 CLINICAL DATA:  Pneumonia, HIV, bronchiectasis EXAM: CT CHEST, ABDOMEN, AND PELVIS WITH CONTRAST TECHNIQUE: Multidetector CT imaging of the chest, abdomen and pelvis was performed following the standard protocol during bolus administration of intravenous contrast. RADIATION DOSE REDUCTION: This exam was performed according to the departmental dose-optimization program which includes automated exposure control, adjustment of the mA and/or kV according to patient size and/or use of iterative reconstruction technique. CONTRAST:  OMNIPAQUE IOHEXOL 300 MG/ML  SOLN COMPARISON:  Outside hospital [facility]) CTA chest dated 06/23/2021. CT abdomen/pelvis dated 09/28/2019. FINDINGS: CT CHEST FINDINGS Cardiovascular: Heart is normal in size.  No pericardial effusion. No evidence of thoracic aortic aneurysm. Right arm PICC terminates at the cavoatrial junction. Mediastinum/Nodes: No suspicious mediastinal lymphadenopathy. Visualized thyroid is unremarkable. Lungs/Pleura: Right middle and bilateral lower lobe pneumonia, right lower lobe predominant, with air bronchograms and bronchiectasis. This is unchanged from recent CT. No suspicious pulmonary nodules. No pleural effusion or pneumothorax. Musculoskeletal: Visualized osseous structures are within normal limits. CT ABDOMEN PELVIS FINDINGS Hepatobiliary: Liver is within normal limits. Gallbladder is unremarkable. No intrahepatic or extrahepatic ductal dilatation. Pancreas: Within normal limits. Spleen: Within normal limits. Adrenals/Urinary Tract: Adrenal glands are within normal limits. Right upper pole cortical  atrophy with two upper pole cysts (series 3/images 62 and 64). Left kidney is within normal limits. No hydronephrosis. Mildly thick-walled bladder. 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 No evidence of bowel obstruction. Appendix is not discretely visualized. No colonic wall thickening or inflammatory changes. Vascular/Lymphatic: No evidence of abdominal aortic aneurysm. No suspicious abdominopelvic lymphadenopathy. Reproductive: Uterus is within normal limits. Bilateral ovaries are within normal limits. Other: No abdominopelvic ascites. Musculoskeletal: Visualized osseous structures are within normal limits. IMPRESSION: Multifocal pneumonia, right lower lobe predominant, unchanged. Mildly thick-walled bladder, correlate for cystitis. Additional ancillary findings as above. Electronically Signed   By: Charline Bills M.D.   On: 06/25/2021 18:58

## 2021-06-30 ENCOUNTER — Encounter (INDEPENDENT_AMBULATORY_CARE_PROVIDER_SITE_OTHER): Payer: Medicaid Other

## 2021-06-30 DIAGNOSIS — E2740 Unspecified adrenocortical insufficiency: Secondary | ICD-10-CM | POA: Diagnosis not present

## 2021-06-30 DIAGNOSIS — R634 Abnormal weight loss: Secondary | ICD-10-CM

## 2021-06-30 DIAGNOSIS — J181 Lobar pneumonia, unspecified organism: Secondary | ICD-10-CM | POA: Diagnosis not present

## 2021-06-30 DIAGNOSIS — B20 Human immunodeficiency virus [HIV] disease: Secondary | ICD-10-CM | POA: Diagnosis not present

## 2021-06-30 DIAGNOSIS — J479 Bronchiectasis, uncomplicated: Secondary | ICD-10-CM | POA: Diagnosis not present

## 2021-06-30 DIAGNOSIS — J189 Pneumonia, unspecified organism: Secondary | ICD-10-CM | POA: Diagnosis not present

## 2021-06-30 LAB — LACTIC ACID, PLASMA: Lactic Acid, Venous: 1.4 mmol/L (ref 0.5–1.9)

## 2021-06-30 MED ORDER — LACTATED RINGERS IV BOLUS: 1000.0000 mL | Freq: Once | INTRAVENOUS | Status: DC

## 2021-06-30 MED ORDER — ALBUMIN HUMAN 25 % IV SOLN
50.0000 g | Freq: Once | INTRAVENOUS | Status: DC
  Filled 2021-06-30: qty 200

## 2021-06-30 MED ORDER — SODIUM CHLORIDE 0.9 % IV BOLUS
250.0000 mL | Freq: Once | INTRAVENOUS | Status: AC
  Administered 2021-06-30: 250 mL via INTRAVENOUS

## 2021-06-30 MED ORDER — SODIUM CHLORIDE 0.9 % IV BOLUS
500.0000 mL | Freq: Once | INTRAVENOUS | Status: AC
  Administered 2021-06-30: 500 mL via INTRAVENOUS

## 2021-06-30 NOTE — Progress Notes (Signed)
This patient is alert and oriented, Denies pain. She is refusing scheduled albumin this a.m., despite education provided, she declines the administration of albumin. Dr. Candiss Norse made aware. Patient will only allow bolus of NS (500 ml) to be given. She also refused multilpe oral scheduled mediations. "Tell the doctor I want to go home." Dr. Merita Norton is aware and the patient was informed of probable discharge tomorrow or the choice of AMA. NS bolus infusing at time of entry

## 2021-06-30 NOTE — Progress Notes (Signed)
PROGRESS NOTE                                                                                                                                                                                                             Patient Demographics:    Frances Ball, is a 31 y.o. female, DOB - 04/28/1990, [id number]  Outpatient Primary MD for the patient is Pcp, No    LOS - 6  Admit date - 06/24/2021    No chief complaint on file.      Brief Narrative (HPI from H&P)   31 y.o. female with medical history significant of HIV on HAART, protein calorie malnutrition, COPD, bronchiectasis, chronic microcytic anemia, presented with increasing of productive cough, shortness of breath, night sweats, weight loss, diarrhea.  Did with the diagnosis of HIV, pneumonia and unintentional weight loss along with AKI.   Subjective:   Patient in bed, appears comfortable, denies any headache, no fever, no chest pain or pressure, no shortness of breath , no abdominal pain. No new focal weakness.   Assessment  & Plan :    Multilobar pneumonia in a patient with HIV positive for rhinovirus and MRSA nasal PCR positive as well.  She definitely has some component of rhinoviral pneumonia, overlapping bacterial pneumonia cannot be ruled out, has relatively low CD4 count but stable viral load, ID on board, she has been treated with 4 days of  Cefdinir & Doxycyline, continue dapsone.  Not hypoxic with stable LDH, hence do not think this is a serious PCP pneumonia.  CT chest appears nonacute, cryptococcal antigen negative, pending final sputum Gram stain culture AFB & Fungitell . Case discussed with ID on 06/27/21.  Discussed again on 06/30/2021 final recommendations on 07/01/2021.  2.  History of diarrhea present on admission.  Resolved.  3.  History of COPD.  Mild wheezing upon admission, supportive care with nebulizer treatment, short trial of steroids started on  admission will be continued as in #8 below.  Wheezing has improved.  4.  History of bronchiectasis.  Management as a #1 above, CT chest noted.  5.  History of microcytic anemia.  Acute on chronic, no signs of active bleeding, iron replacement orally, will expect some fall with heme dilution caused by IV fluids, patient has consented for transfusion if needed.  6.  Severe protein calorie malnutrition.  Placed on protein supplementation.  Appetite good here, encouraged to consume protein supplements, outpatient weight loss work-up by PCP.  Stable TSH, stable viral load, nonacute CT chest abdomen pelvis.  7.  HIV.  On Biktarvy, ID to follow.  Patient states she is compliant with treatment and medications, HIV viral load satisfactory, CD4 count somewhat low, continue treatment per ID.  8.  Relative adrenal insufficiency with baseline cortisol level low in the setting of hypotension, she is already on steroid helps cosyntropin stimulation test would be misleading.  On IV Decadron along with IV fluids, also started on low-dose midodrine.  Symptom-free.   Patient noncompliant with DVT prophylaxis, counseled also refusing medicated body wipes for MRSA, counseled.       Condition - Extremely Guarded  Family Communication  :  mother bedside on 06/25/2021, mother over the phone on 06/26/2021  Code Status : Full  Consults  : ID  PUD Prophylaxis :    Procedures  :     R Arm PICC placed at [facility] on 06/22/2021.    CT scan chest, abdomen and pelvis. Multifocal pneumonia, right lower lobe predominant, unchanged. Mildly thick-walled bladder, correlate for cystitis.      Disposition Plan  :    Status is: Inpatient  DVT Prophylaxis  :    heparin injection 5,000 Units Start: 06/25/21 1400 SCDs Start: 06/24/21 1938    Lab Results  Component Value Date   PLT 462 (H) 06/28/2021    Diet :  Diet Order             Diet regular Room service appropriate? Yes; Fluid consistency:  Thin  Diet effective now                    Inpatient Medications  Scheduled Meds:  (feeding supplement) PROSource Plus  30 mL Oral BID BM   acidophilus  1 capsule Oral TID AC   bictegravir-emtricitabine-tenofovir AF  1 tablet Oral Daily   budesonide (PULMICORT) nebulizer solution  0.25 mg Nebulization BID   Chlorhexidine Gluconate Cloth  6 each Topical Daily   dapsone  100 mg Oral Daily   dexamethasone  4 mg Oral Daily   doxycycline  100 mg Oral BID WC   feeding supplement  237 mL Oral BID BM   ferrous sulfate  325 mg Oral BID WC   folic acid  1 mg Oral Daily   guaiFENesin  1,200 mg Oral BID   heparin injection (subcutaneous)  5,000 Units Subcutaneous Q8H   ipratropium-albuterol  3 mL Nebulization BID   multivitamin with minerals  1 tablet Oral Daily   mupirocin ointment  1 application. Nasal BID   thiamine  100 mg Oral Daily   vitamin B-12  1,000 mcg Oral Daily   Continuous Infusions:  albumin human     lactated ringers     PRN Meds:.albuterol, ondansetron, sodium chloride flush     Time Spent in minutes  30   Susa Raring M.D on 06/30/2021 at 9:09 AM  To page go to [email or web address]   [facility] -  Office  [phone]  See all Orders from today for further details    Objective:   Vitals:   06/29/21 2325 06/30/21 0236 06/30/21 0332 06/30/21 0823  BP: (!) 89/59  (!) 85/57 104/75  Pulse: 80   79  Resp: Temp: 98.6 F (37 C)  97.8 F (36.6 C) 98 F (36.7 C)  TempSrc: Oral  Oral Oral  SpO2:   94% 92%  Weight:  47.6 kg      Wt Readings from Last 3 Encounters:  06/30/21 47.6 kg  04/17/21 50.3 kg  09/27/20 51 kg    No intake or output data in the 24 hours ending 06/30/21 0909     Physical Exam  Frail cachectic Caucasian female sitting up in hospital bed in no distress, mild cough, R Arm PICC [place].AT,PERRAL Supple Neck, No JVD,   Symmetrical Chest wall movement, Good air movement bilaterally, CTAB RRR,No Gallops, Rubs or new  Murmurs,  +ve B.Sounds, Abd Soft, No tenderness,   No Cyanosis, Clubbing or edema      Data Review:    CBC Recent Labs  Lab 06/25/21 0343 06/26/21 0445 06/27/21 0330 06/28/21 0249  WBC 8.4 6.9 7.0 6.7  HGB 7.6* 7.6* 7.8* 7.7*  HCT 23.8* 23.9* 24.6* 24.3*  PLT 445* 487* 481* 462*  MCV 81.5 81.8 82.3 83.5  MCH 26.0 26.0 26.1 26.5  MCHC 31.9 31.8 31.7 31.7  RDW 17.0* 17.1* 17.3* 17.4*  LYMPHSABS  --  1.1 0.8 1.1  MONOABS  --  0.4 0.3 0.3  EOSABS  --  0.0 0.0 0.0  BASOSABS  --  0.0 0.0 0.0    Electrolytes Recent Labs  Lab 06/24/21 2107 06/25/21 0343 06/25/21 0817 06/26/21 0445 06/26/21 0502 06/27/21 0330 06/28/21 0249 06/30/21 0027  NA 136 137  --  137  --  136 134*  --   K 3.3* 3.9  --  3.6  --  4.1 3.9  --   CL 102 105  --  105  --  105 102  --   CO2 25 25  --  26  --  26 24  --   GLUCOSE 145* 99  --  97  --  117* 117*  --   BUN 16 15  --  7  --  8 12  --   CREATININE 1.13* 1.08*  --  0.94  --  0.96 0.94  --   CALCIUM 8.3* 8.1*  --  8.5*  --  8.6* 8.4*  --   AST  --  15  --  13*  --  12* 14*  --   ALT  --  8  --  8  --  8 10  --   ALKPHOS  --  33*  --  36*  --  39 38  --   BILITOT  --  0.4  --  0.6  --  0.3 0.4  --   ALBUMIN  --  2.1*  --  2.2*  --  2.4* 2.4*  --   MG  --  2.1  --  2.3  --  2.2 1.9  --   CRP  --   --  7.3* 6.2*  --  4.2* 1.8*  --   PROCALCITON 0.31 0.21  --  0.11  --   --   --   --   LATICACIDVEN  --   --   --   --   --   --   --  1.4  TSH  --   --   --   --  1.393  --   --   --   BNP  --   --  16.9  --  36.4 44.3 43.8  --     ------------------------------------------------------------------------------------------------------------------ No results for input(s): CHOL, HDL, LDLCALC, TRIG, CHOLHDL, LDLDIRECT in the last 72 hours.  No results found for:  HGBA1C  No results for input(s): TSH, T4TOTAL, T3FREE, THYROIDAB in the last 72 hours.  Invalid input(s):  FREET3  ------------------------------------------------------------------------------------------------------------------ ID Labs Recent Labs  Lab 06/24/21 2107 06/25/21 0343 06/25/21 0817 06/26/21 0445 06/27/21 0330 06/28/21 0249 06/30/21 0027  WBC  --  8.4  --  6.9 7.0 6.7  --   PLT  --  445*  --  487* 481* 462*  --   CRP  --   --  7.3* 6.2* 4.2* 1.8*  --   PROCALCITON 0.31 0.21  --  0.11  --   --   --   LATICACIDVEN  --   --   --   --   --   --  1.4  CREATININE 1.13* 1.08*  --  0.94 0.96 0.94  --     Radiology Reports No results found.

## 2021-06-30 NOTE — TOC Initial Note (Signed)
Transition of Care [facility]) - Initial/Assessment Note    Patient Details  Name: Frances Ball MRN: [id number] Date of Birth: 01/06/1991  Transition of Care [facility]) CM/SW Contact:    Harriet Masson, RN Phone Number: 06/30/2021, 11:40 AM  Clinical Narrative:                 Spoke to patient regarding transition needs.  Patient is followed by infectious disease as outpatient. Patient has transportation to apts and transportation home once discharged.  TOC will continue to follow for needs. Expected Discharge Plan: Home/Self Care Barriers to Discharge: Continued Medical Work up   Patient Goals and CMS Choice Patient states their goals for this hospitalization and ongoing recovery are:: return home      Expected Discharge Plan and Services Expected Discharge Plan: Home/Self Care                                              Prior Living Arrangements/Services   Lives with:: Minor Children Patient language and need for interpreter reviewed:: Yes Do you feel safe going back to the place where you live?: Yes      Need for Family Participation in Patient Care: Yes (Comment) Care giver support system in place?: Yes (comment)   Criminal Activity/Legal Involvement Pertinent to Current Situation/Hospitalization: No - Comment as needed  Activities of Daily Living Home Assistive Devices/Equipment: None ADL Screening (condition at time of admission) Patient's cognitive ability adequate to safely complete daily activities?: No Is the patient deaf or have difficulty hearing?: No Does the patient have difficulty seeing, even when wearing glasses/contacts?: No Does the patient have difficulty concentrating, remembering, or making decisions?: No Patient able to express need for assistance with ADLs?: No Does the patient have difficulty dressing or bathing?: No Independently performs ADLs?: Yes (appropriate for developmental age) Does the patient have difficulty walking or  climbing stairs?: No Weakness of Legs: None Weakness of Arms/Hands: None  Permission Sought/Granted                  Emotional Assessment   Attitude/Demeanor/Rapport: Engaged Affect (typically observed): Accepting Orientation: : Oriented to Self, Oriented to Place, Oriented to  Time, Oriented to Situation Alcohol / Substance Use: Not Applicable Psych Involvement: No (comment)  Admission diagnosis:  PNA (pneumonia) [J18.9] Patient Active Problem List   Diagnosis Date Noted   PNA (pneumonia) 06/24/2021   Lobar pneumonia (HCC) 09/27/2020   Palpitations 03/15/2020   Migraines 11/22/2018   Family history of factor V deficiency 11/22/2018   Bronchiectasis (HCC) 03/28/2018   Healthcare maintenance 06/02/2017   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 [place] [address] Phone: [phone] Fax: [phone]  [facility] [address] [place] [address] Phone: [phone] Fax: [phone]  [facility] [address] [place] [address] Phone: [phone] Fax: [phone]     Social Determinants of Health (SDOH) Interventions    Readmission Risk Interventions     View : No  data to display.

## 2021-07-01 ENCOUNTER — Other Ambulatory Visit (HOSPITAL_COMMUNITY): Payer: Self-pay

## 2021-07-01 DIAGNOSIS — J189 Pneumonia, unspecified organism: Secondary | ICD-10-CM | POA: Diagnosis not present

## 2021-07-01 LAB — CORTISOL: Cortisol, Plasma: 1.3 ug/dL

## 2021-07-01 MED ORDER — MIDODRINE HCL 5 MG PO TABS
5.0000 mg | ORAL_TABLET | Freq: Three times a day (TID) | ORAL | Status: DC
  Administered 2021-07-01: 5 mg via ORAL
  Filled 2021-07-01: qty 1

## 2021-07-01 MED ORDER — DEXAMETHASONE 2 MG PO TABS
2.0000 mg | ORAL_TABLET | Freq: Every day | ORAL | 0 refills | Status: DC
  Filled 2021-07-01: qty 30, 30d supply, fill #0

## 2021-07-01 MED ORDER — CYANOCOBALAMIN 1000 MCG PO TABS
1000.0000 ug | ORAL_TABLET | Freq: Every day | ORAL | 0 refills | Status: DC
  Filled 2021-07-01: qty 30, 30d supply, fill #0

## 2021-07-01 MED ORDER — DEXAMETHASONE 2 MG PO TABS
2.0000 mg | ORAL_TABLET | Freq: Every day | ORAL | Status: DC
  Filled 2021-07-01: qty 1

## 2021-07-01 MED ORDER — DAPSONE 100 MG PO TABS
100.0000 mg | ORAL_TABLET | Freq: Every day | ORAL | 0 refills | Status: DC
  Filled 2021-07-01: qty 30, 30d supply, fill #0

## 2021-07-01 MED ORDER — MIDODRINE HCL 5 MG PO TABS
5.0000 mg | ORAL_TABLET | Freq: Three times a day (TID) | ORAL | 0 refills | Status: DC
  Filled 2021-07-01: qty 90, 30d supply, fill #0

## 2021-07-01 NOTE — Discharge Instructions (Signed)
Follow with Primary MD Blanchard Kelch, NP in 1 to 2 days, follow-up on your pending sputum culture results, you also need referral to endocrinologist and must follow-up with endocrinologist within the next 1 to 2 weeks  Activity: As tolerated with Full fall precautions use walker/cane & assistance as needed  Disposition Home   Diet: Heart Healthy    Special Instructions: If you have smoked or chewed Tobacco  in the last 2 yrs please stop smoking, stop any regular Alcohol  and or any Recreational drug use.  On your next visit with your primary care physician please Get Medicines reviewed and adjusted.  Please request your Prim.MD to go over all Hospital Tests and Procedure/Radiological results at the follow up, please get all Hospital records sent to your Prim MD by signing hospital release before you go home.  If you experience worsening of your admission symptoms, develop shortness of breath, life threatening emergency, suicidal or homicidal thoughts you must seek medical attention immediately by calling 911 or calling your MD immediately  if symptoms less severe.  You Must read complete instructions/literature along with all the possible adverse reactions/side effects for all the Medicines you take and that have been prescribed to you. Take any new Medicines after you have completely understood and accpet all the possible adverse reactions/side effects.

## 2021-07-01 NOTE — Progress Notes (Signed)
RA DL PICC removed per protocol per MD order. Manual pressure applied for 5 mins. Vaseline gauze, gauze, and Tegaderm applied over insertion site. No bleeding or swelling noted. Instructed patient to remain in bed for thirty mins. Educated patient about S/S of infection and when to call MD; no heavy lifting or pressure on right side for 24 hours; keep dressing dry and intact for 24 hours. Pt verbalized comprehension. 

## 2021-07-01 NOTE — Progress Notes (Signed)
Pt refused all AM meds except for 4 meds given this AM. Reported she did not need the rest of the meds refused. Provider notified, per hospital protocol. No new orders given.

## 2021-07-01 NOTE — Discharge Summary (Signed)
Frances Ball WUJ:[id number] DOB: [birth year] DOA: 06/24/2021  PCP: Blanchard Kelch, NP  Admit date: 06/24/2021  Discharge date: 07/01/2021  Admitted From: Home   Disposition:  Home   Recommendations for Outpatient Follow-up:   Follow up with PCP in 1-2 weeks  PCP Please obtain BMP/CBC, 2 view CXR in 1week,  (see Discharge instructions)   PCP Please follow up on the following pending results: Pending sputum AFB culture, fungal assay.  Needs close outpatient endocrine follow-up within 1 to 2 weeks of discharge for relative adrenal insufficiency.   Home Health: None   Equipment/Devices: None  Consultations: ID Discharge Condition: Stable    CODE STATUS: Full    Diet Recommendation: Heart Healthy   Diet Order             Diet - low sodium heart healthy           Diet regular Room service appropriate? Yes; Fluid consistency: Thin  Diet effective now                   CC  - cough    Brief history of present illness from the day of admission and additional interim summary    31 y.o. female with medical history significant of HIV on HAART, protein calorie malnutrition, COPD, bronchiectasis, chronic microcytic anemia, presented with increasing of productive cough, shortness of breath, night sweats, weight loss, diarrhea.  Did with the diagnosis of HIV, pneumonia and unintentional weight loss along with AKI.                                                                 Hospital Course      Multilobar pneumonia in a patient with HIV positive for rhinovirus and MRSA nasal PCR positive as well.  She definitely had some component of rhinoviral pneumonia, overlapping bacterial pneumonia could not be ruled out, had relatively low CD4 count but stable viral load, ID on board, he was seen by ID and has  completed her dose of Cefdinir & Doxycyline, continue dapsone which is her home medication.  Not hypoxic with stable LDH, hence do not think this is a serious PCP pneumonia.  CT chest appears nonacute, cryptococcal antigen negative, pending final sputum Gram stain culture AFB & Fungitell . Case discussed with ID Dr. Thedore Mins on 07/01/2021 stable for discharge with outpatient ID follow-up, no isolation needed.  She is currently at her baseline..   2.  History of diarrhea present on admission.  Resolved.   3.  History of COPD.  Mild wheezing upon admission, was treated with short course of steroid upon admission this has resolved and back to her baseline.  No wheezing now   4.  History of bronchiectasis.  Management as a #1 above, CT chest noted.   5.  History of microcytic anemia.  Borderline B12 placed on replacement PCP to monitor.   6.  Severe protein calorie malnutrition.  Placed on protein supplementation.  Appetite good here, encouraged to consume protein supplements, outpatient weight loss work-up by PCP.  Stable TSH, stable viral load, nonacute CT chest abdomen pelvis.  Continue outpatient weight loss work-up by PCP and endocrine.   7.  HIV.  On Biktarvy, ID to follow.  Patient states she is compliant with treatment and medications, HIV viral load satisfactory, CD4 count somewhat low, continue treatment per ID.   8.  Relative adrenal insufficiency with baseline cortisol level low in the setting of hypotension, she is already on steroid helps cosyntropin stimulation test would be misleading.  Was on Decadron along with IV fluids for hydration, MAP is above 70 and she is symptom-free.  Will be discharged on low-dose midodrine and Decadron. Will benefit from outpatient endocrine follow-up in 1 to 2 weeks to be arranged by PCP.   Noncompliance with multiple medications in the hospital.  Extensively counseled.  Discharge diagnosis     Principal Problem:   PNA (pneumonia) Active Problems:    Bronchiectasis (HCC)   Lobar pneumonia (HCC)    Discharge instructions    Discharge Instructions     Diet - low sodium heart healthy   Complete by: As directed    Discharge instructions   Complete by: As directed    Follow with Primary MD Blanchard Kelch, NP in 1 to 2 days, follow-up on your pending sputum culture results, you also need referral to endocrinologist and must follow-up with endocrinologist within the next 1 to 2 weeks  Activity: As tolerated with Full fall precautions use walker/cane & assistance as needed  Disposition Home   Diet: Heart Healthy    Special Instructions: If you have smoked or chewed Tobacco  in the last 2 yrs please stop smoking, stop any regular Alcohol  and or any Recreational drug use.  On your next visit with your primary care physician please Get Medicines reviewed and adjusted.  Please request your Prim.MD to go over all Hospital Tests and Procedure/Radiological results at the follow up, please get all Hospital records sent to your Prim MD by signing hospital release before you go home.  If you experience worsening of your admission symptoms, develop shortness of breath, life threatening emergency, suicidal or homicidal thoughts you must seek medical attention immediately by calling 911 or calling your MD immediately  if symptoms less severe.  You Must read complete instructions/literature along with all the possible adverse reactions/side effects for all the Medicines you take and that have been prescribed to you. Take any new Medicines after you have completely understood and accpet all the possible adverse reactions/side effects.   Increase activity slowly   Complete by: As directed        Discharge Medications   Allergies as of 07/01/2021       Reactions   Levaquin [levofloxacin In D5w] Shortness Of Breath, Rash   Zithromax [azithromycin] Shortness Of Breath, Swelling, Other (See Comments)   Pt reports tightness in skin with redness    Bactrim [sulfamethoxazole-trimethoprim] Swelling, Other (See Comments)   Pt reports that she was placed on Bactrim for pneumonia when she was d/c from [facility] and developed a red face with swelling and can not tolerate Bactrim.    Sulfa Antibiotics Swelling   Vancomycin Other (See Comments)   Intolerance, pt states that as long as med is administered with Benadryl  its tolarable   Darunavir Rash        Medication List     TAKE these medications    acetaminophen 325 MG tablet Commonly known as: TYLENOL Take 325 mg by mouth every 6 (six) hours as needed for moderate pain.   albuterol 108 (90 Base) MCG/ACT inhaler Commonly known as: VENTOLIN HFA Inhale 2 puffs into the lungs every 6 (six) hours as needed for wheezing or shortness of breath.   Biktarvy 50-200-25 MG Tabs tablet Generic drug: bictegravir-emtricitabine-tenofovir AF Take 1 tablet by mouth daily.   cyanocobalamin 1000 MCG tablet Take 1 tablet (1,000 mcg total) by mouth daily.   dapsone 100 MG tablet Take 1 tablet (100 mg total) by mouth daily.   dexamethasone 2 MG tablet Commonly known as: DECADRON Take 1 tablet (2 mg total) by mouth daily. You must follow-up with endocrinologist within 2 weeks, do not stop this medication unless stopped by the endocrinologist or your PCP.   midodrine 5 MG tablet Commonly known as: PROAMATINE Take 1 tablet (5 mg total) by mouth 3 (three) times daily with meals.   Suboxone 8-2 MG Film Generic drug: Buprenorphine HCl-Naloxone HCl Place 1 Film under the tongue in the morning and at bedtime.         Follow-up Information     Blanchard Kelchixon, Stephanie N, NP. Schedule an appointment as soon as possible for a visit in 1 day(s).   Specialty: Infectious Diseases Why: Also need referral to endocrinologist, must follow-up with endocrinologist in 1 to 2 weeks. Contact information: [address] [place] [address] [phone]                 Major procedures and  Radiology Reports - PLEASE review detailed and final reports thoroughly  -       CT CHEST ABDOMEN PELVIS W CONTRAST  Result Date: 06/25/2021 CLINICAL DATA:  Pneumonia, HIV, bronchiectasis EXAM: CT CHEST, ABDOMEN, AND PELVIS WITH CONTRAST TECHNIQUE: Multidetector CT imaging of the chest, abdomen and pelvis was performed following the standard protocol during bolus administration of intravenous contrast. RADIATION DOSE REDUCTION: This exam was performed according to the departmental dose-optimization program which includes automated exposure control, adjustment of the mA and/or kV according to patient size and/or use of iterative reconstruction technique. CONTRAST:  100mL OMNIPAQUE IOHEXOL 300 MG/ML  SOLN COMPARISON:  Outside hospital [facility] CTA chest dated 06/23/2021. CT abdomen/pelvis dated 09/28/2019. FINDINGS: CT CHEST FINDINGS Cardiovascular: Heart is normal in size.  No pericardial effusion. No evidence of thoracic aortic aneurysm. Right arm PICC terminates at the cavoatrial junction. Mediastinum/Nodes: No suspicious mediastinal lymphadenopathy. Visualized thyroid is unremarkable. Lungs/Pleura: Right middle and bilateral lower lobe pneumonia, right lower lobe predominant, with air bronchograms and bronchiectasis. This is unchanged from recent CT. No suspicious pulmonary nodules. No pleural effusion or pneumothorax. Musculoskeletal: Visualized osseous structures are within normal limits. CT ABDOMEN PELVIS FINDINGS Hepatobiliary: Liver is within normal limits. Gallbladder is unremarkable. No intrahepatic or extrahepatic ductal dilatation. Pancreas: Within normal limits. Spleen: Within normal limits. Adrenals/Urinary Tract: Adrenal glands are within normal limits. Right upper pole cortical atrophy with two upper pole cysts (series 3/images 62 and 64). Left kidney is within normal limits. No hydronephrosis. Mildly thick-walled bladder. Stomach/Bowel: Stomach is notable for a posterior gastric cardia  diverticulum (series 3/image 47). No evidence of bowel obstruction. Appendix is not discretely visualized. No colonic wall thickening or inflammatory changes. Vascular/Lymphatic: No evidence of abdominal aortic aneurysm. No suspicious abdominopelvic lymphadenopathy. Reproductive: Uterus is within normal limits.  Bilateral ovaries are within normal limits. Other: No abdominopelvic ascites. Musculoskeletal: Visualized osseous structures are within normal limits. IMPRESSION: Multifocal pneumonia, right lower lobe predominant, unchanged. Mildly thick-walled bladder, correlate for cystitis. Additional ancillary findings as above. Electronically Signed   By: Charline Bills M.D.   On: 06/25/2021 18:58   DG Chest Port 1 View  Result Date: 06/25/2021 CLINICAL DATA:  Shortness of breath and cough with right chest pain EXAM: PORTABLE CHEST 1 VIEW COMPARISON:  10/27/2016 FINDINGS: Infiltrate at the right more than left base, stable from CT yesterday. No effusion or pneumothorax. Normal heart size and mediastinal contours accounting for rotation. Right PICC in good position. IMPRESSION: Unchanged right more than left infiltrate in the lower lungs. Electronically Signed   By: Tiburcio Pea M.D.   On: 06/25/2021 07:08    Today   Subjective    Frances Ball today has no headache,no chest abdominal pain,no new weakness tingling or numbness, feels much better wants to go home today.     Objective   Blood pressure 98/67, pulse 75, temperature 97.8 F (36.6 C), temperature source Oral, resp. rate 18, weight 47.6 kg, SpO2 92 %.  No intake or output data in the 24 hours ending 07/01/21 0924  Exam  Frail cachectic Caucasian female sitting up in hospital bed in no distress, mild cough, R Arm PICC [place].AT,PERRAL Supple Neck, No JVD,   Symmetrical Chest wall movement, Good air movement bilaterally, CTAB RRR,No Gallops, Rubs or new Murmurs,  +ve B.Sounds, Abd Soft, No tenderness,   No Cyanosis, Clubbing or edema     Data Review   Recent Labs  Lab 06/25/21 0343 06/26/21 0445 06/27/21 0330 06/28/21 0249  WBC 8.4 6.9 7.0 6.7  HGB 7.6* 7.6* 7.8* 7.7*  HCT 23.8* 23.9* 24.6* 24.3*  PLT 445* 487* 481* 462*  MCV 81.5 81.8 82.3 83.5  MCH 26.0 26.0 26.1 26.5  MCHC 31.9 31.8 31.7 31.7  RDW 17.0* 17.1* 17.3* 17.4*  LYMPHSABS  --  1.1 0.8 1.1  MONOABS  --  0.4 0.3 0.3  EOSABS  --  0.0 0.0 0.0  BASOSABS  --  0.0 0.0 0.0    Recent Labs  Lab 06/24/21 2107 06/25/21 0343 06/25/21 0817 06/26/21 0445 06/26/21 0502 06/27/21 0330 06/28/21 0249 06/30/21 0027  NA 136 137  --  137  --  136 134*  --   K 3.3* 3.9  --  3.6  --  4.1 3.9  --   CL 102 105  --  105  --  105 102  --   CO2 25 25  --  26  --  26 24  --   GLUCOSE 145* 99  --  97  --  117* 117*  --   BUN 16 15  --  7  --  8 12  --   CREATININE 1.13* 1.08*  --  0.94  --  0.96 0.94  --   CALCIUM 8.3* 8.1*  --  8.5*  --  8.6* 8.4*  --   AST  --  15  --  13*  --  12* 14*  --   ALT  --  8  --  8  --  8 10  --   ALKPHOS  --  33*  --  36*  --  39 38  --   BILITOT  --  0.4  --  0.6  --  0.3 0.4  --   ALBUMIN  --  2.1*  --  2.2*  --  2.4* 2.4*  --   MG  --  2.1  --  2.3  --  2.2 1.9  --   PHOS  --  3.6  --   --   --   --   --   --   CRP  --   --  7.3* 6.2*  --  4.2* 1.8*  --   PROCALCITON 0.31 0.21  --  0.11  --   --   --   --   LATICACIDVEN  --   --   --   --   --   --   --  1.4  TSH  --   --   --   --  1.393  --   --   --   BNP  --   --  16.9  --  36.4 44.3 43.8  --     Total Time in preparing paper work, data evaluation and todays exam - 35 minutes  Susa Raring M.D on 07/01/2021 at 9:24 AM  [facility]

## 2021-07-01 NOTE — Progress Notes (Signed)
Pt discharged to home. DC instructions given. No concerns voiced. Meds delivered. Pt left unit in wheelchair pushed by hospital volunteer. Left in stable condition.

## 2021-07-02 LAB — FUNGITELL, SERUM: Fungitell Result: 47 pg/mL (ref <80)

## 2021-07-02 LAB — ACID FAST SMEAR (AFB, MYCOBACTERIA): Acid Fast Smear: NEGATIVE

## 2021-07-02 NOTE — Telephone Encounter (Signed)
Please see the MyChart message reply(ies) for my assessment and plan.    This patient gave consent for this Medical Advice Message and is aware that it may result in a bill to [employer], as well as the possibility of receiving a bill for a co-payment or deductible. They are an established patient, but are not seeking medical advice exclusively about a problem treated during an in person or video visit in the last seven days. I did not recommend an in person or video visit within seven days of my reply.    I spent a total of 10 minutes cumulative time within 7 days through [employer].   Problem List Items Addressed This Visit   None Visit Diagnoses     Adrenal insufficiency (HCC)    -  Primary   Relevant Orders   Ambulatory referral to Endocrinology   Weight loss       Relevant Orders   Ambulatory referral to Endocrinology        Rexene Alberts, NP

## 2021-07-03 ENCOUNTER — Inpatient Hospital Stay: Payer: Medicaid Other | Admitting: Infectious Diseases

## 2021-07-09 ENCOUNTER — Inpatient Hospital Stay: Payer: Medicaid Other | Admitting: Infectious Diseases

## 2021-07-10 ENCOUNTER — Inpatient Hospital Stay: Payer: Medicaid Other | Admitting: Infectious Diseases

## 2021-07-14 ENCOUNTER — Inpatient Hospital Stay: Payer: Medicaid Other | Admitting: Infectious Diseases

## 2021-07-14 ENCOUNTER — Other Ambulatory Visit (HOSPITAL_COMMUNITY): Payer: Self-pay

## 2021-07-24 ENCOUNTER — Other Ambulatory Visit: Payer: Self-pay

## 2021-07-24 ENCOUNTER — Ambulatory Visit (INDEPENDENT_AMBULATORY_CARE_PROVIDER_SITE_OTHER): Payer: Medicaid Other | Admitting: Infectious Diseases

## 2021-07-24 ENCOUNTER — Encounter: Payer: Self-pay | Admitting: Infectious Diseases

## 2021-07-24 VITALS — BP 99/70 | HR 93 | Temp 98.5°F | Ht 62.0 in | Wt 108.0 lb

## 2021-07-24 DIAGNOSIS — R634 Abnormal weight loss: Secondary | ICD-10-CM | POA: Diagnosis not present

## 2021-07-24 DIAGNOSIS — Z8619 Personal history of other infectious and parasitic diseases: Secondary | ICD-10-CM

## 2021-07-24 DIAGNOSIS — B20 Human immunodeficiency virus [HIV] disease: Secondary | ICD-10-CM

## 2021-07-24 DIAGNOSIS — J189 Pneumonia, unspecified organism: Secondary | ICD-10-CM | POA: Diagnosis present

## 2021-07-24 DIAGNOSIS — D6851 Activated protein C resistance: Secondary | ICD-10-CM

## 2021-07-24 NOTE — Assessment & Plan Note (Signed)
Continues on biktarvy daily with dapsone for prophlaxis. CD4 up to 110 during hospital stay recently. Will have her back early September to repeat immune check to allow for more improvement. Recommend to continue dapsone until that time frame.  VL in hospital < 20 indicating good compliance with Biktarvy.   Needs pap smear at some point but not urgent issue at present.  Contraception Care: BTL history, no further plans for family planning.   RTC in September for routine follow up and monitoring for antiviral treatment and immune recovery.

## 2021-07-24 NOTE — Patient Instructions (Addendum)
Please continue to take your Biktarvy and Dapsone.   You can hold off on the steroid for now since you feel ok - message me if you notice weight loss / fatigue. Get on the scale to weigh 2x a week please.   [facility] - call to see if they can get your appointment scheduled. The referral is in place.  Address: [address] Frances Ball [place], [place] [address] Phone: [phone]  [name] said to call the [facility] to check on [name] Biktarvy - [phone]  - they want to talk with you anyway to verify shipment.     There is a hematology center through [facility] in [place] - will place referral for you to see them if that's easier.  Address: [address], [place], [place] [address] Phone: [phone]  See if we can get you and [name] back up here in early September to check in again.
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
## 2021-07-24 NOTE — Assessment & Plan Note (Signed)
RVP positive for rhinovirus +/- bacterial component. She improved on doxy/cefdinir combination and all symptoms have resolved.

## 2021-07-24 NOTE — Assessment & Plan Note (Signed)
Cured. 

## 2021-07-28 DIAGNOSIS — D6851 Activated protein C resistance: Secondary | ICD-10-CM | POA: Insufficient documentation

## 2021-07-28 NOTE — Assessment & Plan Note (Signed)
Referral to Hematology as requested entered for [place] location.

## 2021-08-10 LAB — ACID FAST CULTURE WITH REFLEXED SENSITIVITIES (MYCOBACTERIA): Acid Fast Culture: NEGATIVE

## 2021-08-19 ENCOUNTER — Inpatient Hospital Stay: Payer: Medicaid Other | Attending: Infectious Diseases

## 2021-08-19 ENCOUNTER — Encounter: Payer: Medicaid Other | Admitting: Oncology

## 2021-08-28 ENCOUNTER — Ambulatory Visit: Payer: Medicaid Other | Admitting: Infectious Diseases

## 2021-09-23 ENCOUNTER — Encounter (INDEPENDENT_AMBULATORY_CARE_PROVIDER_SITE_OTHER): Payer: Medicaid Other

## 2021-09-23 DIAGNOSIS — B370 Candidal stomatitis: Secondary | ICD-10-CM | POA: Diagnosis not present

## 2021-09-23 MED ORDER — AMOXICILLIN-POT CLAVULANATE 875-125 MG PO TABS: 1.0000 | ORAL_TABLET | Freq: Two times a day (BID) | ORAL | 0 refills | Status: AC

## 2021-09-23 NOTE — Telephone Encounter (Signed)
Please see the MyChart message reply(ies) for my assessment and plan.    This patient gave consent for this Medical Advice Message and is aware that it may result in a bill to their insurance company, as well as the possibility of receiving a bill for a co-payment or deductible. They are an established patient, but are not seeking medical advice exclusively about a problem treated during an in person or video visit in the last seven days. I did not recommend an in person or video visit within seven days of my reply.    I spent a total of 8 minutes cumulative time within 7 days through MyChart messaging.  Miel Wisener, NP   

## 2021-11-04 ENCOUNTER — Ambulatory Visit: Payer: Medicaid Other | Admitting: Internal Medicine
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
## 2021-11-28 ENCOUNTER — Encounter (INDEPENDENT_AMBULATORY_CARE_PROVIDER_SITE_OTHER): Payer: Medicaid Other | Admitting: Infectious Diseases

## 2021-11-28 DIAGNOSIS — R002 Palpitations: Secondary | ICD-10-CM

## 2021-11-28 DIAGNOSIS — R000 Tachycardia, unspecified: Secondary | ICD-10-CM | POA: Diagnosis not present

## 2021-11-28 NOTE — Telephone Encounter (Signed)
Received this message from patient:   Hey thank you for trying to get me into see all these drs I'm going to call and try and reschedule all of those appointments I havent had reliable transportation since the end of June. But finally I do so I wanna get everything looked at and checked. My mom wants to see if I can get a referral to see a cardiologist in [place] Agustin Cree. Donnald Garre been having spells and my heart rate runs up to 160 and then my blood pressure does next and its really scary and I just want answers :/. You're truly the best dr Rene Paci ever had thank you so much for helping me and being patient with me.     Please see the MyChart message reply(ies) for my assessment and plan.    This patient gave consent for this Medical Advice Message and is aware that it may result in a bill to [employer], as well as the possibility of receiving a bill for a co-payment or deductible. They are an established patient, but are not seeking medical advice exclusively about a problem treated during an in person or video visit in the last seven days. I did not recommend an in person or video visit within seven days of my reply.    I spent a total of 12 minutes cumulative time within 7 days through [employer], coordination of care and chart review of pertinent information.     Janene Madeira, NP

## 2021-12-11 ENCOUNTER — Encounter: Payer: Self-pay | Admitting: Cardiology

## 2021-12-11 ENCOUNTER — Ambulatory Visit: Payer: Medicaid Other | Attending: Cardiology | Admitting: Cardiology

## 2021-12-11 ENCOUNTER — Ambulatory Visit: Payer: Medicaid Other | Attending: Cardiology

## 2021-12-11 VITALS — BP 98/68 | HR 80 | Ht 62.0 in | Wt 116.2 lb

## 2021-12-11 DIAGNOSIS — R0600 Dyspnea, unspecified: Secondary | ICD-10-CM

## 2021-12-11 DIAGNOSIS — D638 Anemia in other chronic diseases classified elsewhere: Secondary | ICD-10-CM | POA: Diagnosis not present

## 2021-12-11 DIAGNOSIS — R002 Palpitations: Secondary | ICD-10-CM

## 2021-12-11 DIAGNOSIS — E0590 Thyrotoxicosis, unspecified without thyrotoxic crisis or storm: Secondary | ICD-10-CM | POA: Diagnosis not present

## 2021-12-11 DIAGNOSIS — B20 Human immunodeficiency virus [HIV] disease: Secondary | ICD-10-CM

## 2021-12-11 NOTE — Patient Instructions (Signed)
Medication Instructions:  Your physician recommends that you continue on your current medications as directed. Please refer to the Current Medication list given to you today.  *If you need a refill on your cardiac medications before your next appointment, please call your pharmacy*   Lab Work: NONE If you have labs (blood work) drawn today and your tests are completely normal, you will receive your results only by: MyChart Message (if you have MyChart) OR A paper copy in the mail If you have any lab test that is abnormal or we need to change your treatment, we will call you to review the results.   Testing/Procedures: Your physician has requested that you have an echocardiogram. Echocardiography is a painless test that uses sound waves to create images of your heart. It provides your doctor with information about the size and shape of your heart and how well your heart's chambers and valves are working. This procedure takes approximately one hour. There are no restrictions for this procedure. Please do NOT wear cologne, perfume, aftershave, or lotions (deodorant is allowed). Please arrive 15 minutes prior to your appointment time.   You have been asked to wear a Zio Heart Monitor today. It is to be worn for 14 days. Please remove the monitor on Nov. 23rd  and mail back in the box provided.  If you have any questions about the monitor please call the company at [phone]     Follow-Up: At [facility], you and your health needs are our priority.  As part of our continuing mission to provide you with exceptional heart care, we have created designated Provider Care Teams.  These Care Teams include your primary Cardiologist (physician) and Advanced Practice Providers (APPs -  Physician Assistants and Nurse Practitioners) who all work together to provide you with the care you need, when you need it.  We recommend signing up for the patient portal called "MyChart".  Sign up  information is provided on this After Visit Summary.  MyChart is used to connect with patients for Virtual Visits (Telemedicine).  Patients are able to view lab/test results, encounter notes, upcoming appointments, etc.  Non-urgent messages can be sent to your provider as well.   To learn more about what you can do with MyChart, go to [email or web address].    Your next appointment:   3 month(s)  The format for your next appointment:   In Person  Provider:   Gypsy Balsam, MD    Other Instructions Echocardiogram An echocardiogram is a test that uses sound waves (ultrasound) to produce images of the heart. Images from an echocardiogram can provide important information about: Heart size and shape. The size and thickness and movement of your heart's walls. Heart muscle function and strength. Heart valve function or if you have stenosis. Stenosis is when the heart valves are too narrow. If blood is flowing backward through the heart valves (regurgitation). A tumor or infectious growth around the heart valves. Areas of heart muscle that are not working well because of poor blood flow or injury from a heart attack. Aneurysm detection. An aneurysm is a weak or damaged part of an artery wall. The wall bulges out from the normal force of blood pumping through the body. Tell a health care provider about: Any allergies you have. All medicines you are taking, including vitamins, herbs, eye drops, creams, and over-the-counter medicines. Any blood disorders you have. Any surgeries you have had. Any medical conditions you have. Whether you are pregnant or may be  pregnant. What are the risks? Generally, this is a safe test. However, problems may occur, including an allergic reaction to dye (contrast) that may be used during the test. What happens before the test? No specific preparation is needed. You may eat and drink normally. What happens during the test?  You will take off your clothes  from the waist up and put on a hospital gown. Electrodes or electrocardiogram (ECG)patches may be placed on your chest. The electrodes or patches are then connected to a device that monitors your heart rate and rhythm. You will lie down on a table for an ultrasound exam. A gel will be applied to your chest to help sound waves pass through your skin. A handheld device, called a transducer, will be pressed against your chest and moved over your heart. The transducer produces sound waves that travel to your heart and bounce back (or "echo" back) to the transducer. These sound waves will be captured in real-time and changed into images of your heart that can be viewed on a video monitor. The images will be recorded on a computer and reviewed by your health care provider. You may be asked to change positions or hold your breath for a short time. This makes it easier to get different views or better views of your heart. In some cases, you may receive contrast through an IV in one of your veins. This can improve the quality of the pictures from your heart. The procedure may vary among health care providers and hospitals. What can I expect after the test? You may return to your normal, everyday life, including diet, activities, and medicines, unless your health care provider tells you not to do that. Follow these instructions at home: It is up to you to get the results of your test. Ask your health care provider, or the department that is doing the test, when your results will be ready. Keep all follow-up visits. This is important. Summary An echocardiogram is a test that uses sound waves (ultrasound) to produce images of the heart. Images from an echocardiogram can provide important information about the size and shape of your heart, heart muscle function, heart valve function, and other possible heart problems. You do not need to do anything to prepare before this test. You may eat and drink  normally. After the echocardiogram is completed, you may return to your normal, everyday life, unless your health care provider tells you not to do that. This information is not intended to replace advice given to you by your health care provider. Make sure you discuss any questions you have with your health care provider. Document Revised: 10/02/2020 Document Reviewed: 09/12/2019 Elsevier Patient Education  2023 Elsevier Inc.   Important Information About Sugar

## 2021-12-11 NOTE — Addendum Note (Signed)
Addended by: Roosvelt Harps R on: 12/11/2021 10:44 AM   Modules accepted: Orders
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
## 2021-12-30 ENCOUNTER — Inpatient Hospital Stay (HOSPITAL_COMMUNITY)
Admission: EM | Admit: 2021-12-30 | Discharge: 2022-01-04 | DRG: 191 | Disposition: A | Discharge status: Home / Self Care | Payer: Medicaid Other | Attending: Internal Medicine | Admitting: Internal Medicine

## 2021-12-30 ENCOUNTER — Emergency Department (HOSPITAL_COMMUNITY): Payer: Medicaid Other

## 2021-12-30 ENCOUNTER — Ambulatory Visit: Payer: Medicaid Other | Attending: Cardiology

## 2021-12-30 ENCOUNTER — Other Ambulatory Visit: Payer: Self-pay

## 2021-12-30 DIAGNOSIS — Z8619 Personal history of other infectious and parasitic diseases: Secondary | ICD-10-CM

## 2021-12-30 DIAGNOSIS — R079 Chest pain, unspecified: Secondary | ICD-10-CM

## 2021-12-30 DIAGNOSIS — F4310 Post-traumatic stress disorder, unspecified: Secondary | ICD-10-CM | POA: Diagnosis present

## 2021-12-30 DIAGNOSIS — E162 Hypoglycemia, unspecified: Secondary | ICD-10-CM | POA: Diagnosis present

## 2021-12-30 DIAGNOSIS — Z79899 Other long term (current) drug therapy: Secondary | ICD-10-CM

## 2021-12-30 DIAGNOSIS — B9789 Other viral agents as the cause of diseases classified elsewhere: Secondary | ICD-10-CM | POA: Diagnosis present

## 2021-12-30 DIAGNOSIS — E46 Unspecified protein-calorie malnutrition: Secondary | ICD-10-CM | POA: Diagnosis present

## 2021-12-30 DIAGNOSIS — D6851 Activated protein C resistance: Secondary | ICD-10-CM | POA: Diagnosis present

## 2021-12-30 DIAGNOSIS — B20 Human immunodeficiency virus [HIV] disease: Secondary | ICD-10-CM | POA: Diagnosis present

## 2021-12-30 DIAGNOSIS — Z6821 Body mass index (BMI) 21.0-21.9, adult: Secondary | ICD-10-CM

## 2021-12-30 DIAGNOSIS — D649 Anemia, unspecified: Secondary | ICD-10-CM | POA: Diagnosis present

## 2021-12-30 DIAGNOSIS — Z8249 Family history of ischemic heart disease and other diseases of the circulatory system: Secondary | ICD-10-CM

## 2021-12-30 DIAGNOSIS — J479 Bronchiectasis, uncomplicated: Secondary | ICD-10-CM | POA: Diagnosis present

## 2021-12-30 DIAGNOSIS — Z21 Asymptomatic human immunodeficiency virus [HIV] infection status: Secondary | ICD-10-CM | POA: Diagnosis present

## 2021-12-30 DIAGNOSIS — Z1152 Encounter for screening for COVID-19: Secondary | ICD-10-CM

## 2021-12-30 DIAGNOSIS — J470 Bronchiectasis with acute lower respiratory infection: Principal | ICD-10-CM | POA: Diagnosis present

## 2021-12-30 DIAGNOSIS — R112 Nausea with vomiting, unspecified: Secondary | ICD-10-CM | POA: Diagnosis present

## 2021-12-30 DIAGNOSIS — B348 Other viral infections of unspecified site: Secondary | ICD-10-CM | POA: Diagnosis present

## 2021-12-30 DIAGNOSIS — D61818 Other pancytopenia: Secondary | ICD-10-CM | POA: Diagnosis present

## 2021-12-30 DIAGNOSIS — Z8701 Personal history of pneumonia (recurrent): Secondary | ICD-10-CM

## 2021-12-30 DIAGNOSIS — R195 Other fecal abnormalities: Secondary | ICD-10-CM | POA: Diagnosis present

## 2021-12-30 DIAGNOSIS — Z881 Allergy status to other antibiotic agents status: Secondary | ICD-10-CM

## 2021-12-30 DIAGNOSIS — D682 Hereditary deficiency of other clotting factors: Secondary | ICD-10-CM | POA: Diagnosis present

## 2021-12-30 DIAGNOSIS — E860 Dehydration: Secondary | ICD-10-CM | POA: Diagnosis present

## 2021-12-30 DIAGNOSIS — E871 Hypo-osmolality and hyponatremia: Secondary | ICD-10-CM | POA: Diagnosis present

## 2021-12-30 DIAGNOSIS — R6510 Systemic inflammatory response syndrome (SIRS) of non-infectious origin without acute organ dysfunction: Secondary | ICD-10-CM | POA: Diagnosis present

## 2021-12-30 DIAGNOSIS — Z882 Allergy status to sulfonamides status: Secondary | ICD-10-CM

## 2021-12-30 DIAGNOSIS — Z833 Family history of diabetes mellitus: Secondary | ICD-10-CM

## 2021-12-30 DIAGNOSIS — R8290 Unspecified abnormal findings in urine: Secondary | ICD-10-CM | POA: Diagnosis present

## 2021-12-30 DIAGNOSIS — Z79891 Long term (current) use of opiate analgesic: Secondary | ICD-10-CM

## 2021-12-30 DIAGNOSIS — Z888 Allergy status to other drugs, medicaments and biological substances status: Secondary | ICD-10-CM

## 2021-12-30 DIAGNOSIS — R1110 Vomiting, unspecified: Principal | ICD-10-CM

## 2021-12-30 DIAGNOSIS — R102 Pelvic and perineal pain: Secondary | ICD-10-CM | POA: Diagnosis present

## 2021-12-30 DIAGNOSIS — Z91410 Personal history of adult physical and sexual abuse: Secondary | ICD-10-CM

## 2021-12-30 DIAGNOSIS — F1111 Opioid abuse, in remission: Secondary | ICD-10-CM

## 2021-12-30 MED ORDER — ONDANSETRON 4 MG PO TBDP
4.0000 mg | ORAL_TABLET | Freq: Once | ORAL | Status: AC
  Administered 2021-12-30: 4 mg via ORAL
  Filled 2021-12-30: qty 1

## 2021-12-30 MED ORDER — LACTATED RINGERS IV BOLUS
1000.0000 mL | Freq: Once | INTRAVENOUS | Status: AC
  Administered 2021-12-30: 1000 mL via INTRAVENOUS

## 2021-12-30 NOTE — ED Provider Notes (Signed)
[facility] EMERGENCY DEPARTMENT Provider Note   CSN: [id number] Arrival date & time: 12/30/21  0944     History  Chief Complaint  Patient presents with   loss of appitite   Fever   Generalized Body Aches    Frances Ball is a 31 y.o. female.  With past medical history of HIV AIDS, factor V deficiency, history of IV drug use who presents to the emergency department with nausea, vomiting and diarrhea.  Patient states that symptoms began Wednesday, 12/24/21. She states she initially spiked a fever of 103. States fever persisted until Friday and has not returned. On Thursday 12/25/21 she began having nausea and vomiting. States she then started having nonbloody diarrhea and inability to tolerate PO. States since then she has vomited daily after attempting PO. She has had limited intake due to severe nausea. Is having myalgias. Additionally, over the past week has had intermittent, sharp left sided chest pain that is brief. She additionally feels mildly short of breath. She denies cough, congestion, sore throat, palpitations, syncope, abdominal pain, dysuria, vaginal discharge. Denies alcohol or substance abuse or IVDU. States she has been taking her biktarvy, but may have missed a few doses.    Fever Associated symptoms: chest pain, diarrhea, nausea and vomiting   Associated symptoms: no cough, no dysuria and no sore throat        Home Medications Prior to Admission medications   Medication Sig Start Date End Date Taking? Authorizing Provider  albuterol (VENTOLIN HFA) 108 (90 Base) MCG/ACT inhaler Inhale 2 puffs into the lungs every 6 (six) hours as needed for wheezing or shortness of breath. 04/17/21   Blanchard Kelch, NP  bictegravir-emtricitabine-tenofovir AF (BIKTARVY) 50-200-25 MG TABS tablet Take 1 tablet by mouth daily. 06/27/21   Leroy Sea, MD  SUBOXONE 8-2 MG FILM Place 1 Film under the tongue in the morning and at bedtime. 06/16/18   [provider]      Allergies    Levaquin [levofloxacin in d5w], Zithromax [azithromycin], Bactrim [sulfamethoxazole-trimethoprim], Sulfa antibiotics, Vancomycin, and Darunavir    Review of Systems   Review of Systems  Constitutional:  Positive for appetite change, fatigue and fever.  HENT:  Negative for sore throat.   Respiratory:  Positive for shortness of breath. Negative for cough.   Cardiovascular:  Positive for chest pain. Negative for palpitations and leg swelling.  Gastrointestinal:  Positive for diarrhea, nausea and vomiting.  Genitourinary:  Negative for dysuria and vaginal discharge.  All other systems reviewed and are negative.   Physical Exam Updated Vital Signs BP (!) 117/91 (BP Location: Right Arm)   Pulse (!) 118   Temp 98.8 F (37.1 C) (Oral)   Resp 16   Ht 5\' 2"  (1.575 m)   Wt 52.2 kg   LMP 11/29/2021   SpO2 98%   BMI 21.03 kg/m  Physical Exam Vitals and nursing note reviewed.  Constitutional:      General: She is not in acute distress.    Appearance: She is ill-appearing. She is not toxic-appearing.     Comments: Thin   HENT:     Head: Normocephalic.     Mouth/Throat:     Mouth: Mucous membranes are dry.     Pharynx: Oropharynx is clear.  Eyes:     General: No scleral icterus.    Extraocular Movements: Extraocular movements intact.  Cardiovascular:     Rate and Rhythm: Regular rhythm. Tachycardia present.     Pulses: Normal  pulses.     Heart sounds: No murmur heard. Pulmonary:     Effort: Pulmonary effort is normal. No respiratory distress.     Breath sounds: Normal breath sounds. No wheezing, rhonchi or rales.  Abdominal:     General: Abdomen is flat. Bowel sounds are normal. There is no distension.     Palpations: Abdomen is soft.     Tenderness: There is generalized abdominal tenderness and tenderness in the periumbilical area and suprapubic area. There is no guarding or rebound.  Musculoskeletal:     Cervical back: Neck supple.     Right  lower leg: No edema.     Left lower leg: No edema.  Skin:    General: Skin is warm and dry.     Capillary Refill: Capillary refill takes less than 2 seconds.     Findings: No rash.  Neurological:     General: No focal deficit present.     Mental Status: She is alert and oriented to person, place, and time. Mental status is at baseline.  Psychiatric:        Mood and Affect: Mood normal.        Behavior: Behavior normal.        Thought Content: Thought content normal.        Judgment: Judgment normal.     ED Results / Procedures / Treatments   Labs (all labs ordered are listed, but only abnormal results are displayed) Labs Reviewed  RESP PANEL BY RT-PCR (FLU A&B, COVID) ARPGX2  RESPIRATORY PANEL BY PCR  GASTROINTESTINAL PANEL BY PCR, STOOL (REPLACES STOOL CULTURE)  LACTIC ACID, PLASMA  LACTIC ACID, PLASMA  CBC WITH DIFFERENTIAL/PLATELET  URINALYSIS, ROUTINE W REFLEX MICROSCOPIC  CBC WITH DIFFERENTIAL/PLATELET  COMPREHENSIVE METABOLIC PANEL  I-STAT BETA HCG BLOOD, ED ([facility], [facility], [facility] ONLY)   EKG None  Radiology DG Chest 2 View  Result Date: 12/30/2021 CLINICAL DATA:  Body aches with vomiting.  Pain under left breast. EXAM: CHEST - 2 VIEW COMPARISON:  Radiographs 12/28/2021 and 06/25/2021.  CT 06/25/2021. FINDINGS: The heart size and mediastinal contours are stable. The lungs are mildly hyperinflated. There is chronic left lower lobe scarring which has mildly improved. No superimposed airspace disease, edema, pleural effusion or pneumothorax. The bones appear unremarkable. IMPRESSION: Improving left lower lobe scarring. No acute cardiopulmonary process. Electronically Signed   By: Carey Bullocks M.D.   On: 12/30/2021 11:35    Procedures Procedures   Medications Ordered in ED Medications  lactated ringers bolus 1,000 mL (has no administration in time range)  ondansetron (ZOFRAN-ODT) disintegrating tablet 4 mg (4 mg Oral Given 12/30/21 1137)  ondansetron (ZOFRAN-ODT)  disintegrating tablet 4 mg (4 mg Oral Given 12/30/21 2132)    ED Course/ Medical Decision Making/ A&P                           Medical Decision Making Amount and/or Complexity of Data Reviewed Labs: ordered. Radiology: ordered.  Care of patient handed off to [employer], PA-C at this time. Patient pending laboratory work-up and CT imaging of her abdomen. Obtaining GI and respiratory full panel given immunosuppression. Needs IVF for dehydration. Please see his note for ultimate disposition and completion of care.  Final Clinical Impression(s) / ED Diagnoses Final diagnoses:  None    Rx / DC Orders ED Discharge Orders     None         Cristopher Peru, PA-C 12/30/21 2330  Wynetta Fines, MD 12/30/21 [phone]

## 2021-12-30 NOTE — ED Provider Triage Note (Signed)
Emergency Medicine Provider Triage Evaluation Note  Frances Ball , a 31 y.o. female  was evaluated in triage.  Patient complains of being sick since the day before Thanksgiving.  Reports nausea, vomiting and diarrhea.  Also decreased appetite.  Spoke with her infectious disease doctor told her to come here for potential IV fluids.  Has a history of AIDS for which she follows with ID.  No urinary or vaginal symptoms  Review of Systems  Positive: NVD,  Negative:   Physical Exam  BP (!) 123/97 (BP Location: Right Arm)   Pulse (!) 123   Temp 98.7 F (37.1 C) (Oral)   Resp 16   Ht 5\' 2"  (1.575 m)   Wt 52.2 kg   LMP 11/29/2021   SpO2 100%   BMI 21.03 kg/m  Gen:   Awake, no distress   Resp:  Normal effort  MSK:   Moves extremities without difficulty  Other:  Soft moderately tender abdomen in all quadrants.  Medical Decision Making  Medically screening exam initiated at 11:08 AM.  Appropriate orders placed.  Frances Ball was informed that the remainder of the evaluation will be completed by another provider, this initial triage assessment does not replace that evaluation, and the importance of remaining in the ED until their evaluation is complete.      Ardine Bjork, PA-C 12/30/21 1129

## 2021-12-30 NOTE — ED Triage Notes (Signed)
Pt. Stated, Frances Ball not been able to keep anything down, not able to eat, my whole body aches and everything smells funny. Pain in on the left side under breast.

## 2021-12-30 NOTE — ED Notes (Signed)
Called for vitals. No response 

## 2021-12-31 ENCOUNTER — Emergency Department (HOSPITAL_COMMUNITY): Payer: Medicaid Other

## 2021-12-31 DIAGNOSIS — D61818 Other pancytopenia: Secondary | ICD-10-CM

## 2021-12-31 DIAGNOSIS — D6851 Activated protein C resistance: Secondary | ICD-10-CM

## 2021-12-31 DIAGNOSIS — E871 Hypo-osmolality and hyponatremia: Secondary | ICD-10-CM

## 2021-12-31 DIAGNOSIS — J479 Bronchiectasis, uncomplicated: Secondary | ICD-10-CM

## 2021-12-31 DIAGNOSIS — B348 Other viral infections of unspecified site: Secondary | ICD-10-CM | POA: Diagnosis not present

## 2021-12-31 DIAGNOSIS — E162 Hypoglycemia, unspecified: Secondary | ICD-10-CM | POA: Diagnosis present

## 2021-12-31 DIAGNOSIS — B20 Human immunodeficiency virus [HIV] disease: Secondary | ICD-10-CM

## 2021-12-31 DIAGNOSIS — R8290 Unspecified abnormal findings in urine: Secondary | ICD-10-CM | POA: Diagnosis present

## 2021-12-31 DIAGNOSIS — R112 Nausea with vomiting, unspecified: Secondary | ICD-10-CM | POA: Diagnosis not present

## 2021-12-31 DIAGNOSIS — R079 Chest pain, unspecified: Secondary | ICD-10-CM

## 2021-12-31 DIAGNOSIS — F1111 Opioid abuse, in remission: Secondary | ICD-10-CM

## 2021-12-31 DIAGNOSIS — R6510 Systemic inflammatory response syndrome (SIRS) of non-infectious origin without acute organ dysfunction: Secondary | ICD-10-CM | POA: Diagnosis not present

## 2021-12-31 LAB — COMPREHENSIVE METABOLIC PANEL
ALT: 8 U/L (ref 0–44)
AST: 16 U/L (ref 15–41)
Albumin: 2.8 g/dL — ABNORMAL LOW (ref 3.5–5.0)
Alkaline Phosphatase: 35 U/L — ABNORMAL LOW (ref 38–126)
Anion gap: 10 (ref 5–15)
BUN: 9 mg/dL (ref 6–20)
CO2: 20 mmol/L — ABNORMAL LOW (ref 22–32)
Calcium: 7.9 mg/dL — ABNORMAL LOW (ref 8.9–10.3)
Chloride: 102 mmol/L (ref 98–111)
Creatinine, Ser: 1.12 mg/dL — ABNORMAL HIGH (ref 0.44–1.00)
GFR, Estimated: >60 mL/min (ref >60)
Glucose, Bld: 69 mg/dL — ABNORMAL LOW (ref 70–99)
Potassium: 3.5 mmol/L (ref 3.5–5.1)
Sodium: 132 mmol/L — ABNORMAL LOW (ref 135–145)
Total Bilirubin: 0.8 mg/dL (ref 0.3–1.2)
Total Protein: 5.7 g/dL — ABNORMAL LOW (ref 6.5–8.1)

## 2021-12-31 LAB — CBC WITH DIFFERENTIAL/PLATELET
Abs Immature Granulocytes: 0.01 10*3/uL (ref 0.00–0.07)
Abs Immature Granulocytes: 0.02 10*3/uL (ref 0.00–0.07)
Basophils Absolute: 0 10*3/uL (ref 0.0–0.1)
Basophils Absolute: 0 10*3/uL (ref 0.0–0.1)
Basophils Relative: 0 %
Basophils Relative: 0 %
Eosinophils Absolute: 0 10*3/uL (ref 0.0–0.5)
Eosinophils Absolute: 0 10*3/uL (ref 0.0–0.5)
Eosinophils Relative: 0 %
Eosinophils Relative: 1 %
HCT: 33.2 % — ABNORMAL LOW (ref 36.0–46.0)
HCT: 44.7 % (ref 36.0–46.0)
Hemoglobin: 10.4 g/dL — ABNORMAL LOW (ref 12.0–15.0)
Hemoglobin: 13.5 g/dL (ref 12.0–15.0)
Immature Granulocytes: 0 %
Immature Granulocytes: 1 %
Lymphocytes Relative: 33 %
Lymphocytes Relative: 46 %
Lymphs Abs: 1 10*3/uL (ref 0.7–4.0)
Lymphs Abs: 1.6 10*3/uL (ref 0.7–4.0)
MCH: 24.2 pg — ABNORMAL LOW (ref 26.0–34.0)
MCH: 24.6 pg — ABNORMAL LOW (ref 26.0–34.0)
MCHC: 30.2 g/dL (ref 30.0–36.0)
MCHC: 31.3 g/dL (ref 30.0–36.0)
MCV: 78.7 fL — ABNORMAL LOW (ref 80.0–100.0)
MCV: 80 fL (ref 80.0–100.0)
Monocytes Absolute: 0.1 10*3/uL (ref 0.1–1.0)
Monocytes Absolute: 0.2 10*3/uL (ref 0.1–1.0)
Monocytes Relative: 4 %
Monocytes Relative: 5 %
Neutro Abs: 1.1 10*3/uL — ABNORMAL LOW (ref 1.7–7.7)
Neutro Abs: 2.9 10*3/uL (ref 1.7–7.7)
Neutrophils Relative %: 48 %
Neutrophils Relative %: 62 %
Platelets: 233 10*3/uL (ref 150–400)
Platelets: 244 10*3/uL (ref 150–400)
RBC: 4.22 MIL/uL (ref 3.87–5.11)
RBC: 5.59 MIL/uL — ABNORMAL HIGH (ref 3.87–5.11)
RDW: 17.4 % — ABNORMAL HIGH (ref 11.5–15.5)
RDW: 17.6 % — ABNORMAL HIGH (ref 11.5–15.5)
WBC: 2.2 10*3/uL — ABNORMAL LOW (ref 4.0–10.5)
WBC: 4.7 10*3/uL (ref 4.0–10.5)
nRBC: 0 % (ref 0.0–0.2)
nRBC: 0 % (ref 0.0–0.2)

## 2021-12-31 LAB — GLUCOSE, CAPILLARY
Glucose-Capillary: 66 mg/dL — ABNORMAL LOW (ref 70–99)
Glucose-Capillary: 82 mg/dL (ref 70–99)

## 2021-12-31 LAB — URINALYSIS, ROUTINE W REFLEX MICROSCOPIC
Bilirubin Urine: NEGATIVE
Glucose, UA: NEGATIVE mg/dL
Hgb urine dipstick: NEGATIVE
Ketones, ur: 80 mg/dL — AB
Leukocytes,Ua: NEGATIVE
Nitrite: NEGATIVE
Protein, ur: 100 mg/dL — AB
Specific Gravity, Urine: 1.021 (ref 1.005–1.030)
pH: 6 (ref 5.0–8.0)

## 2021-12-31 LAB — RESPIRATORY PANEL BY PCR

## 2021-12-31 LAB — I-STAT CHEM 8, ED
BUN: 13 mg/dL (ref 6–20)
Calcium, Ion: 1.05 mmol/L — ABNORMAL LOW (ref 1.15–1.40)
Chloride: 105 mmol/L (ref 98–111)
Creatinine, Ser: 0.8 mg/dL (ref 0.44–1.00)
Glucose, Bld: 70 mg/dL (ref 70–99)
HCT: 39 % (ref 36.0–46.0)
Hemoglobin: 13.3 g/dL (ref 12.0–15.0)
Potassium: 4.3 mmol/L (ref 3.5–5.1)
Sodium: 136 mmol/L (ref 135–145)
TCO2: 19 mmol/L — ABNORMAL LOW (ref 22–32)

## 2021-12-31 LAB — MAGNESIUM: Magnesium: 1.5 mg/dL — ABNORMAL LOW (ref 1.7–2.4)

## 2021-12-31 LAB — LIPASE, BLOOD: Lipase: 33 U/L (ref 11–51)

## 2021-12-31 LAB — RESP PANEL BY RT-PCR (FLU A&B, COVID) ARPGX2
Influenza A by PCR: NEGATIVE
Influenza B by PCR: NEGATIVE
SARS Coronavirus 2 by RT PCR: NEGATIVE

## 2021-12-31 LAB — RAPID URINE DRUG SCREEN, HOSP PERFORMED
Amphetamines: NOT DETECTED
Barbiturates: NOT DETECTED
Benzodiazepines: POSITIVE — AB
Cocaine: NOT DETECTED
Opiates: NOT DETECTED
Tetrahydrocannabinol: NOT DETECTED

## 2021-12-31 LAB — PROCALCITONIN: Procalcitonin: <0.1 ng/mL

## 2021-12-31 LAB — TROPONIN I (HIGH SENSITIVITY): Troponin I (High Sensitivity): 7 ng/L (ref <18)

## 2021-12-31 LAB — LACTIC ACID, PLASMA: Lactic Acid, Venous: 0.8 mmol/L (ref 0.5–1.9)

## 2021-12-31 LAB — CBG MONITORING, ED: Glucose-Capillary: 72 mg/dL (ref 70–99)

## 2021-12-31 MED ORDER — IOHEXOL 350 MG/ML SOLN
75.0000 mL | Freq: Once | INTRAVENOUS | Status: AC | PRN
  Administered 2021-12-31: 75 mL via INTRAVENOUS

## 2021-12-31 MED ORDER — ACETAMINOPHEN 325 MG PO TABS
650.0000 mg | ORAL_TABLET | Freq: Four times a day (QID) | ORAL | Status: DC | PRN
  Administered 2022-01-01 – 2022-01-04 (×5): 650 mg via ORAL
  Filled 2021-12-31 (×5): qty 2

## 2021-12-31 MED ORDER — PANTOPRAZOLE SODIUM 40 MG PO TBEC
40.0000 mg | DELAYED_RELEASE_TABLET | Freq: Every day | ORAL | Status: DC
  Administered 2022-01-01 – 2022-01-04 (×4): 40 mg via ORAL
  Filled 2021-12-31 (×4): qty 1

## 2021-12-31 MED ORDER — ACETAMINOPHEN 650 MG RE SUPP: 650.0000 mg | Freq: Four times a day (QID) | RECTAL | Status: DC | PRN

## 2021-12-31 MED ORDER — BICTEGRAVIR-EMTRICITAB-TENOFOV 50-200-25 MG PO TABS
1.0000 | ORAL_TABLET | Freq: Every day | ORAL | Status: DC
  Filled 2021-12-31: qty 1

## 2021-12-31 MED ORDER — LACTATED RINGERS IV BOLUS
1000.0000 mL | Freq: Once | INTRAVENOUS | Status: AC
  Administered 2021-12-31: 1000 mL via INTRAVENOUS

## 2021-12-31 MED ORDER — BICTEGRAVIR-EMTRICITAB-TENOFOV 50-200-25 MG PO TABS
1.0000 | ORAL_TABLET | Freq: Every day | ORAL | Status: DC
  Administered 2022-01-01 – 2022-01-03 (×3): 1 via ORAL
  Filled 2021-12-31 (×6): qty 1

## 2021-12-31 MED ORDER — GUAIFENESIN ER 600 MG PO TB12
600.0000 mg | ORAL_TABLET | Freq: Two times a day (BID) | ORAL | Status: DC
  Administered 2021-12-31 – 2022-01-04 (×9): 600 mg via ORAL
  Filled 2021-12-31 (×9): qty 1

## 2021-12-31 MED ORDER — PANTOPRAZOLE SODIUM 40 MG IV SOLR
40.0000 mg | Freq: Once | INTRAVENOUS | Status: AC
  Administered 2021-12-31: 40 mg via INTRAVENOUS
  Filled 2021-12-31: qty 10

## 2021-12-31 MED ORDER — ONDANSETRON HCL 4 MG/2ML IJ SOLN
4.0000 mg | Freq: Four times a day (QID) | INTRAMUSCULAR | Status: DC | PRN
  Administered 2021-12-31 – 2022-01-04 (×9): 4 mg via INTRAVENOUS
  Filled 2021-12-31 (×9): qty 2

## 2021-12-31 MED ORDER — ENOXAPARIN SODIUM 40 MG/0.4ML IJ SOSY
40.0000 mg | PREFILLED_SYRINGE | INTRAMUSCULAR | Status: DC
  Filled 2021-12-31 (×2): qty 0.4

## 2021-12-31 MED ORDER — SODIUM CHLORIDE 0.9% FLUSH
3.0000 mL | Freq: Two times a day (BID) | INTRAVENOUS | Status: DC
  Administered 2021-12-31 – 2022-01-04 (×9): 3 mL via INTRAVENOUS

## 2021-12-31 MED ORDER — BUPRENORPHINE HCL-NALOXONE HCL 8-2 MG SL SUBL
1.0000 | SUBLINGUAL_TABLET | Freq: Two times a day (BID) | SUBLINGUAL | Status: DC
  Filled 2021-12-31 (×3): qty 1

## 2021-12-31 MED ORDER — LACTATED RINGERS IV SOLN: INTRAVENOUS | Status: AC

## 2021-12-31 MED ORDER — ALBUTEROL SULFATE (2.5 MG/3ML) 0.083% IN NEBU: 2.5000 mg | INHALATION_SOLUTION | Freq: Four times a day (QID) | RESPIRATORY_TRACT | Status: DC | PRN

## 2021-12-31 MED ORDER — MAGNESIUM SULFATE 2 GM/50ML IV SOLN
2.0000 g | Freq: Once | INTRAVENOUS | Status: AC
  Administered 2021-12-31: 2 g via INTRAVENOUS
  Filled 2021-12-31: qty 50

## 2021-12-31 MED ORDER — DEXTROSE 50 % IV SOLN: 1.0000 | INTRAVENOUS | Status: DC | PRN

## 2021-12-31 MED ORDER — DEXTROSE 50 % IV SOLN
1.0000 | Freq: Once | INTRAVENOUS | Status: AC
  Administered 2021-12-31: 50 mL via INTRAVENOUS
  Filled 2021-12-31: qty 50

## 2021-12-31 NOTE — ED Provider Notes (Signed)
Patient here with generalized body aches, nausea, vomiting, diarrhea.  States that she has not been able to tolerate oral intake for the past week.  She reports fevers at home to 103.  She is followed by infectious disease.  She has history of AIDS.  Last CD4 count back in May of this year was in the low 100s.  Labs and imaging ordered by prior provider notable for CT abdomen/pelvis with questionable pyelonephritis, however urinalysis is not consistent with infection.  She denies dysuria.  20 pathogen RVP positive for enterovirus/rhinovirus, however I do not think that this would cause her persistent vomiting.  She does have significant improvement in her heart rate after a couple liters of fluid.  However, on repeat evaluation she is still not been able to tolerate eating.  She states that she is concerned about going home and having persistent vomiting.  I feel that it would be beneficial for observation admission for further evaluation and monitoring.  I consulted with Dr. Arlean Hopping, he was appreciated for admitting.   Roxy Horseman, PA-C 12/31/21 0623    Shon Baton, MD 01/01/22 [phone]

## 2021-12-31 NOTE — ED Notes (Signed)
Patient taken to CT.
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
## 2021-12-31 NOTE — ED Notes (Signed)
Pt in room A&O x4. Updated on plan of care. MD at bedside. Pt reports some N without new episodes of V. Educated on med administration. Room adjusted for comfort. ABD soft non-tender.

## 2021-12-31 NOTE — Progress Notes (Signed)
Patient stated she took her own home medication, BIKTARVY, due to sticking to her home med schedule.This RN educated this pt on the importance of not taking home meds without informing RN or MD before. Pt verbalized understanding to no take any home meds without approval. Pt has 3 single tablets in personal belongings at bedside. Pt prefer to keep them in in belongings. Pharmacy made aware to adjust timing for medication.

## 2021-12-31 NOTE — Progress Notes (Signed)
Patient arrived to 6 north room 25 alert and oriented x4. Pain level 3/10. Bed in lowest position, call light in reach. Will continue to monitor pt.

## 2021-12-31 NOTE — Progress Notes (Signed)
  Carryover admission to the Day Admitter.  I discussed this case with the EDP, Roxy Horseman, PA.  Per these discussions:   This is a 31 year old female with HIV/AIDS (most recent CD4 count 110 in May '23), who is being admitted with intractable nausea/vomiting, dehydration, and further infectious work-up after presenting with complaint 1 week of intermittent fevers associated with recurrent nausea/vomiting associate with nonbloody, nonbilious emesis as well as some diarrhea and generalized abdominal discomfort.  Denies any dysuria.  Unable to tolerate p.o. at this time, with very little oral intake over the course the last week.  Failed trial of ice chips in the ED, even after 2 doses of Zofran, continuing experience persistent nausea/vomiting.  In the ED, she is afebrile.  Was tachycardic upon arrival with initial rates in the 120s, which is improved in the 80s following IV fluids; normotensive.  Labs notable for UA that appears inconsistent with UTI, demonstrating 0-5 white blood cells.  Blood cell count 4700.  Nonelevated lactic acid.  Chest x-ray showed no evidence of acute cardiopulmonary process.  CT abdomen/pelvis without evidence of enterocolitis.   She has not been started on antibiotics, and I will defer decision making regarding initiation of biotics to the admitting hospitalist.  I have placed an order for observation to med telemetry for further evaluation management of intractable nausea/vomiting, dehydration.   I have placed some additional preliminary admit orders via the adult multi-morbid admission order set. I have also ordered continuous lactated Ringer's.  Prn IV Zofran.  I have initially set her diet has clear liquids.  Also ordered morning labs in the form of CMP, CBC, serum magnesium level.    Newton Pigg, DO Hospitalist

## 2021-12-31 NOTE — ED Notes (Signed)
Patient's IV no longer working at this time. Consult put in for IV team for difficult stick

## 2021-12-31 NOTE — ED Notes (Signed)
CT called at this time and notified that labs are back and patient is ready for CT

## 2022-01-01 ENCOUNTER — Inpatient Hospital Stay (HOSPITAL_COMMUNITY): Payer: Medicaid Other

## 2022-01-01 ENCOUNTER — Encounter: Payer: Self-pay | Admitting: Cardiology

## 2022-01-01 DIAGNOSIS — R112 Nausea with vomiting, unspecified: Secondary | ICD-10-CM | POA: Diagnosis present

## 2022-01-01 DIAGNOSIS — R9431 Abnormal electrocardiogram [ECG] [EKG]: Secondary | ICD-10-CM | POA: Diagnosis not present

## 2022-01-01 DIAGNOSIS — R102 Pelvic and perineal pain: Secondary | ICD-10-CM | POA: Diagnosis present

## 2022-01-01 DIAGNOSIS — R1110 Vomiting, unspecified: Secondary | ICD-10-CM | POA: Diagnosis not present

## 2022-01-01 DIAGNOSIS — E46 Unspecified protein-calorie malnutrition: Secondary | ICD-10-CM | POA: Diagnosis present

## 2022-01-01 DIAGNOSIS — D649 Anemia, unspecified: Secondary | ICD-10-CM | POA: Diagnosis present

## 2022-01-01 DIAGNOSIS — Z91410 Personal history of adult physical and sexual abuse: Secondary | ICD-10-CM | POA: Diagnosis not present

## 2022-01-01 DIAGNOSIS — R195 Other fecal abnormalities: Secondary | ICD-10-CM | POA: Diagnosis present

## 2022-01-01 DIAGNOSIS — E860 Dehydration: Secondary | ICD-10-CM | POA: Diagnosis present

## 2022-01-01 DIAGNOSIS — Z881 Allergy status to other antibiotic agents status: Secondary | ICD-10-CM | POA: Diagnosis not present

## 2022-01-01 DIAGNOSIS — Z882 Allergy status to sulfonamides status: Secondary | ICD-10-CM | POA: Diagnosis not present

## 2022-01-01 DIAGNOSIS — J470 Bronchiectasis with acute lower respiratory infection: Secondary | ICD-10-CM | POA: Diagnosis present

## 2022-01-01 DIAGNOSIS — F4310 Post-traumatic stress disorder, unspecified: Secondary | ICD-10-CM | POA: Diagnosis present

## 2022-01-01 DIAGNOSIS — B20 Human immunodeficiency virus [HIV] disease: Secondary | ICD-10-CM | POA: Diagnosis present

## 2022-01-01 DIAGNOSIS — D682 Hereditary deficiency of other clotting factors: Secondary | ICD-10-CM | POA: Diagnosis present

## 2022-01-01 DIAGNOSIS — B9789 Other viral agents as the cause of diseases classified elsewhere: Secondary | ICD-10-CM | POA: Diagnosis present

## 2022-01-01 DIAGNOSIS — Z8249 Family history of ischemic heart disease and other diseases of the circulatory system: Secondary | ICD-10-CM | POA: Diagnosis not present

## 2022-01-01 DIAGNOSIS — E162 Hypoglycemia, unspecified: Secondary | ICD-10-CM | POA: Diagnosis present

## 2022-01-01 DIAGNOSIS — J479 Bronchiectasis, uncomplicated: Secondary | ICD-10-CM | POA: Diagnosis present

## 2022-01-01 DIAGNOSIS — E871 Hypo-osmolality and hyponatremia: Secondary | ICD-10-CM | POA: Diagnosis present

## 2022-01-01 DIAGNOSIS — R8290 Unspecified abnormal findings in urine: Secondary | ICD-10-CM | POA: Diagnosis present

## 2022-01-01 DIAGNOSIS — R7989 Other specified abnormal findings of blood chemistry: Secondary | ICD-10-CM | POA: Diagnosis not present

## 2022-01-01 DIAGNOSIS — Z1152 Encounter for screening for COVID-19: Secondary | ICD-10-CM | POA: Diagnosis not present

## 2022-01-01 DIAGNOSIS — Z833 Family history of diabetes mellitus: Secondary | ICD-10-CM | POA: Diagnosis not present

## 2022-01-01 DIAGNOSIS — R6510 Systemic inflammatory response syndrome (SIRS) of non-infectious origin without acute organ dysfunction: Secondary | ICD-10-CM | POA: Diagnosis present

## 2022-01-01 DIAGNOSIS — Z8619 Personal history of other infectious and parasitic diseases: Secondary | ICD-10-CM | POA: Diagnosis not present

## 2022-01-01 DIAGNOSIS — Z8701 Personal history of pneumonia (recurrent): Secondary | ICD-10-CM | POA: Diagnosis not present

## 2022-01-01 DIAGNOSIS — D6851 Activated protein C resistance: Secondary | ICD-10-CM | POA: Diagnosis present

## 2022-01-01 LAB — COMPREHENSIVE METABOLIC PANEL
ALT: 9 U/L (ref 0–44)
AST: 19 U/L (ref 15–41)
Albumin: 2.8 g/dL — ABNORMAL LOW (ref 3.5–5.0)
Alkaline Phosphatase: 35 U/L — ABNORMAL LOW (ref 38–126)
Anion gap: 11 (ref 5–15)
BUN: 5 mg/dL — ABNORMAL LOW (ref 6–20)
CO2: 21 mmol/L — ABNORMAL LOW (ref 22–32)
Calcium: 7.9 mg/dL — ABNORMAL LOW (ref 8.9–10.3)
Chloride: 106 mmol/L (ref 98–111)
Creatinine, Ser: 0.87 mg/dL (ref 0.44–1.00)
GFR, Estimated: >60 mL/min (ref >60)
Glucose, Bld: 87 mg/dL (ref 70–99)
Potassium: 4 mmol/L (ref 3.5–5.1)
Sodium: 138 mmol/L (ref 135–145)
Total Bilirubin: 0.4 mg/dL (ref 0.3–1.2)
Total Protein: 5.6 g/dL — ABNORMAL LOW (ref 6.5–8.1)

## 2022-01-01 LAB — CBC
HCT: 32.9 % — ABNORMAL LOW (ref 36.0–46.0)
Hemoglobin: 10.6 g/dL — ABNORMAL LOW (ref 12.0–15.0)
MCH: 24.6 pg — ABNORMAL LOW (ref 26.0–34.0)
MCHC: 32.2 g/dL (ref 30.0–36.0)
MCV: 76.3 fL — ABNORMAL LOW (ref 80.0–100.0)
Platelets: 219 10*3/uL (ref 150–400)
RBC: 4.31 MIL/uL (ref 3.87–5.11)
RDW: 17.3 % — ABNORMAL HIGH (ref 11.5–15.5)
WBC: 3.3 10*3/uL — ABNORMAL LOW (ref 4.0–10.5)
nRBC: 0 % (ref 0.0–0.2)

## 2022-01-01 LAB — GLUCOSE, CAPILLARY: Glucose-Capillary: 78 mg/dL (ref 70–99)

## 2022-01-01 MED ORDER — MAGNESIUM SULFATE 2 GM/50ML IV SOLN
2.0000 g | Freq: Once | INTRAVENOUS | Status: AC
  Administered 2022-01-01: 2 g via INTRAVENOUS
  Filled 2022-01-01: qty 50

## 2022-01-01 MED ORDER — SODIUM CHLORIDE 0.9 % IV SOLN
2.0000 g | INTRAVENOUS | Status: DC
  Administered 2022-01-01: 2 g via INTRAVENOUS
  Filled 2022-01-01 (×2): qty 20

## 2022-01-01 MED ORDER — SODIUM CHLORIDE 0.9 % IV SOLN: INTRAVENOUS | Status: DC

## 2022-01-01 NOTE — Progress Notes (Signed)
PROGRESS NOTE    Frances Ball  ZOX:[id number] DOB: 04/08/1990 DOA: 12/30/2021 PCP: Blanchard Kelchixon, Stephanie N, NP   Brief Narrative: 31 year old with past medical history significant for HIV on HARRT , bronchiectasis, anemia, hepatitis C status posttreatment, protein caloric malnutrition presents complaining of nausea and vomiting.  Unable to tolerate oral intake.  Reports urine was neon yellow, reports some suprapubic abdominal pain.  She reports fever up to 103. Evaluation in the ED: Chest x-ray improved lower lobe [place] with no acute process.  CT scan of the abdomen and pelvis: Showed focal hypoperfusion at the left kidney indicative of possible nephritis, left small ovarian cyst, postinfectious cystic area on the right kidney.  Respiratory panel positive for rhinovirus enterovirus.   Assessment & Plan:   Principal Problem:   Nausea and vomiting Active Problems:   Bronchiectasis (HCC)   Rhinovirus/enterovirus   Pancytopenia (HCC)   SIRS (systemic inflammatory response syndrome) (HCC)   Hypoglycemia   Chest pain   Hyponatremia   Hypomagnesemia   Abnormal urinalysis   HIV (human immunodeficiency virus infection) (HCC)   Factor 5 Leiden mutation, heterozygous (HCC)   History of opioid abuse (HCC)   Intractable nausea and vomiting   1-Nausea vomiting: Suspect related to acute viral illness Report care, continue with Zofran as needed, start IV fluids. -Still complaining of  Nausea.  -Lipase 33.  -IV fluids   2-Rhinovirus, history of bronchiectasis: Supportive care. Due to her prior history of pneumonia bronchiectasis will start IV ceftriaxone  3-Supra-pubic pain;  Abnormal CT scan showed hypoperfusion in the upper pole of the left kidney which could be indicative of pyelonephritis. Urine culture growing 30 K colonies, gram negative rods She will be cover with IV ceftriaxone.   4-History of palpitation;  Near syncope. She is due for Repeat ECHO. Will proceed with ECHO.    5-HVI; she is compliant with her meds.  Continue with HARRT.  Check CD4  6-Loose Stool; check GI pathogen.  Hypomagnesemia; replete IV>  History of opioid Use,  Chronic Suboxon.  Pancytopenia, SIRS; in setting of viral illness.  Hyponatremia; continue with IV fluids.   Factor V Leiden Mutation;  Needs to follow up with Hematology.  Hypoglycemia; due to poor oral inatke. Continue with IV fluids.  Chest pain;  Troponin negative.  No hypoxemia.  Suspect related to bronchitis, cough   Estimated body mass index is 21.03 kg/m as calculated from the following:   Height as of this encounter: 5\' 2"  (1.575 m).   Weight as of this encounter: 52.2 kg.   DVT prophylaxis: Lovenox Code Status: Full code Family Communication: Car e discussed with patient, Mother was on the phone Disposition Plan:  Status is: Inpatient Remains inpatient appropriate because: management of Viral illness, vomiting, not tolerating diet consistently     Consultants:  None  Procedures:  ECHO  Antimicrobials:    Subjective: She report sharp pain in her lower abdomen. She also report loose stool.  Still having nausea. She is waiting to get nausea meds to try to drink something.   Objective: Vitals:   12/31/21 1415 12/31/21 1527 01/01/22 0432 01/01/22 0903  BP: 103/78 103/78 96/71 102/72  Pulse: 83 97 85 97  Resp: 16 17 17 15   Temp: 97.6 F (36.4 C) 98.2 F (36.8 C) 98.1 F (36.7 C) 99.1 F (37.3 C)  TempSrc: Oral Oral Oral Oral  SpO2: 99% 99% 98% 99%  Weight:      Height:        Intake/Output Summary (  Last 24 hours) at 01/01/2022 1536 Last data filed at 12/31/2021 1542 Gross per 24 hour  Intake 870.51 ml  Output --  Net 870.51 ml   Filed Weights   12/30/21 1038  Weight: 52.2 kg    Examination:  General exam: Appears calm and comfortable  Respiratory system: Clear to auscultation. Respiratory effort normal. Cardiovascular system: S1 & S2 heard, RRR.  Gastrointestinal system:  Abdomen is nondistended, soft and nontender. No organomegaly or masses felt. Normal bowel sounds heard. Central nervous system: Alert and oriented. No focal neurological deficits. Extremities: Symmetric 5 x 5 power.   Data Reviewed: I have personally reviewed following labs and imaging studies  CBC: Recent Labs  Lab 12/30/21 2330 12/31/21 0216 12/31/21 0709 01/01/22 0815  WBC 4.7  --  2.2* 3.3*  NEUTROABS 2.9  --  1.1*  --   HGB 13.5 13.3 10.4* 10.6*  HCT 44.7 39.0 33.2* 32.9*  MCV 80.0  --  78.7* 76.3*  PLT 244  --  233 219   Basic Metabolic Panel: Recent Labs  Lab 12/31/21 0216 12/31/21 0709 01/01/22 0519  NA 136 132* 138  K 4.3 3.5 4.0  CL 105 102 106  CO2  --  20* 21*  GLUCOSE 70 69* 87  BUN 13 9 5*  CREATININE 0.80 1.12* 0.87  CALCIUM  --  7.9* 7.9*  MG  --  1.5*  --    GFR: Estimated Creatinine Clearance: 73.4 mL/min (by C-G formula based on SCr of 0.87 mg/dL). Liver Function Tests: Recent Labs  Lab 12/31/21 0709 01/01/22 0519  AST 16 19  ALT 8 9  ALKPHOS 35* 35*  BILITOT 0.8 0.4  PROT 5.7* 5.6*  ALBUMIN 2.8* 2.8*   Recent Labs  Lab 12/31/21 0709  LIPASE 33   No results for input(s): "AMMONIA" in the last 168 hours. Coagulation Profile: No results for input(s): "INR", "PROTIME" in the last 168 hours. Cardiac Enzymes: No results for input(s): "CKTOTAL", "CKMB", "CKMBINDEX", "TROPONINI" in the last 168 hours. BNP (last 3 results) No results for input(s): "PROBNP" in the last 8760 hours. HbA1C: No results for input(s): "HGBA1C" in the last 72 hours. CBG: Recent Labs  Lab 12/31/21 1204 12/31/21 1608 12/31/21 2216 01/01/22 0906  GLUCAP 72 66* 82 78   Lipid Profile: No results for input(s): "CHOL", "HDL", "LDLCALC", "TRIG", "CHOLHDL", "LDLDIRECT" in the last 72 hours. Thyroid Function Tests: No results for input(s): "TSH", "T4TOTAL", "FREET4", "T3FREE", "THYROIDAB" in the last 72 hours. Anemia Panel: No results for input(s): "VITAMINB12",  "FOLATE", "FERRITIN", "TIBC", "IRON", "RETICCTPCT" in the last 72 hours. Sepsis Labs: Recent Labs  Lab 12/30/21 2330 12/31/21 0709  PROCALCITON  --  <0.10  LATICACIDVEN 0.8  --     Recent Results (from the past 240 hour(s))  Resp Panel by RT-PCR (Flu A&B, Covid) Anterior Nasal Swab     Status: None   Collection Time: 12/30/21 11:30 PM   Specimen: Anterior Nasal Swab  Result Value Ref Range Status   SARS Coronavirus 2 by RT PCR NEGATIVE NEGATIVE Final    Comment: (NOTE) SARS-CoV-2 target nucleic acids are NOT DETECTED.  The SARS-CoV-2 RNA is generally detectable in upper respiratory specimens during the acute phase of infection. The lowest concentration of SARS-CoV-2 viral copies this assay can detect is 138 copies/mL. A negative result does not preclude SARS-Cov-2 infection and should not be used as the sole basis for treatment or other patient management decisions. A negative result may occur with  improper specimen collection/handling, submission  of specimen other than nasopharyngeal swab, presence of viral mutation(s) within the areas targeted by this assay, and inadequate number of viral copies(<138 copies/mL). A negative result must be combined with clinical observations, patient history, and epidemiological information. The expected result is Negative.  Fact Sheet for Patients:  BloggerCourse.com  Fact Sheet for Healthcare Providers:  SeriousBroker.it  This test is no t yet approved or cleared by the [place] FDA and  has been authorized for detection and/or diagnosis of SARS-CoV-2 by FDA under an Emergency Use Authorization (EUA). This EUA will remain  in effect (meaning this test can be used) for the duration of the COVID-19 declaration under Section 564(b)(1) of the Act, 21 U.S.C.section 360bbb-3(b)(1), unless the authorization is terminated  or revoked sooner.       Influenza A by PCR NEGATIVE NEGATIVE  Final   Influenza B by PCR NEGATIVE NEGATIVE Final    Comment: (NOTE) The Xpert Xpress SARS-CoV-2/FLU/RSV plus assay is intended as an aid in the diagnosis of influenza from Nasopharyngeal swab specimens and should not be used as a sole basis for treatment. Nasal washings and aspirates are unacceptable for Xpert Xpress SARS-CoV-2/FLU/RSV testing.  Fact Sheet for Patients: BloggerCourse.com  Fact Sheet for Healthcare Providers: SeriousBroker.it  This test is not yet approved or cleared by the [place] FDA and has been authorized for detection and/or diagnosis of SARS-CoV-2 by FDA under an Emergency Use Authorization (EUA). This EUA will remain in effect (meaning this test can be used) for the duration of the COVID-19 declaration under Section 564(b)(1) of the Act, 21 U.S.C. section 360bbb-3(b)(1), unless the authorization is terminated or revoked.  Performed at [facility], [address] [place], [place] [address]   Respiratory (~20 pathogens) panel by PCR     Status: Abnormal   Collection Time: 12/30/21 11:30 PM   Specimen: Anterior Nasal Swab; Respiratory  Result Value Ref Range Status   Adenovirus NOT DETECTED NOT DETECTED Final   Coronavirus 229E NOT DETECTED NOT DETECTED Final    Comment: (NOTE) The Coronavirus on the Respiratory Panel, DOES NOT test for the novel  Coronavirus (2019 nCoV)    Coronavirus HKU1 NOT DETECTED NOT DETECTED Final   Coronavirus NL63 NOT DETECTED NOT DETECTED Final   Coronavirus OC43 NOT DETECTED NOT DETECTED Final   Metapneumovirus NOT DETECTED NOT DETECTED Final   Rhinovirus / Enterovirus DETECTED (A) NOT DETECTED Final   Influenza A NOT DETECTED NOT DETECTED Final   Influenza B NOT DETECTED NOT DETECTED Final   Parainfluenza Virus 1 NOT DETECTED NOT DETECTED Final   Parainfluenza Virus 2 NOT DETECTED NOT DETECTED Final   Parainfluenza Virus 3 NOT DETECTED NOT DETECTED Final    Parainfluenza Virus 4 NOT DETECTED NOT DETECTED Final   Respiratory Syncytial Virus NOT DETECTED NOT DETECTED Final   Bordetella pertussis NOT DETECTED NOT DETECTED Final   Bordetella Parapertussis NOT DETECTED NOT DETECTED Final   Chlamydophila pneumoniae NOT DETECTED NOT DETECTED Final   Mycoplasma pneumoniae NOT DETECTED NOT DETECTED Final    Comment: Performed at [facility], [address], [place], [place] [address]  Urine Culture     Status: Abnormal (Preliminary result)   Collection Time: 12/31/21  7:31 AM   Specimen: Urine, Clean Catch  Result Value Ref Range Status   Specimen Description URINE, CLEAN CATCH  Final   Special Requests NONE  Final   Culture (A)  Final    30,000 COLONIES/mL GRAM NEGATIVE RODS IDENTIFICATION AND SUSCEPTIBILITIES TO  FOLLOW Performed at [facility], [address] [place], [place] [address]    Report Status PENDING  Incomplete         Radiology Studies: CT Abdomen Pelvis W Contrast  Result Date: 12/31/2021 CLINICAL DATA:  31 year old female with history of acute onset of nonlocalized abdominal pain. Nausea, vomiting and diarrhea. Decreased appetite. EXAM: CT ABDOMEN AND PELVIS WITH CONTRAST TECHNIQUE: Multidetector CT imaging of the abdomen and pelvis was performed using the standard protocol following bolus administration of intravenous contrast. RADIATION DOSE REDUCTION: This exam was performed according to the departmental dose-optimization program which includes automated exposure control, adjustment of the mA and/or kV according to patient size and/or use of iterative reconstruction technique. CONTRAST:  59mL OMNIPAQUE IOHEXOL 350 MG/ML SOLN COMPARISON:  CT of the abdomen and pelvis 06/25/2021. FINDINGS: Lower chest: Areas of cylindrical and varicose bronchiectasis are noted in the left lower lobe, and to a lesser extent in the right middle lobe where there is also extensive thickening of the peribronchovascular interstitium and  regional architectural distortion and chronic volume loss. Hepatobiliary: No suspicious cystic or solid hepatic lesions. No intra or extrahepatic biliary ductal dilatation. Gallbladder is normal in appearance. Pancreas: No pancreatic mass. No pancreatic ductal dilatation. No pancreatic or peripancreatic fluid collections or inflammatory changes. Spleen: Unremarkable. Adrenals/Urinary Tract: Multifocal cortical thinning in the right kidney, most severe posteriorly and in the upper pole, presumably areas of scarring from prior renal infection or infarctions. Left kidney is generally normal in appearance, although there is focal hypoperfusion in the upper pole which somewhat wedge-shaped in appearance. No aggressive appearing renal lesions. No hydroureteronephrosis. Urinary bladder is unremarkable in appearance. Bilateral adrenal glands are normal in appearance. Stomach/Bowel: The appearance of the stomach is normal. There is no pathologic dilatation of small bowel or colon. Status post appendectomy. Vascular/Lymphatic: No significant atherosclerotic disease, aneurysm or dissection noted in the abdominal or pelvic vasculature. No lymphadenopathy noted in the abdomen or pelvis. Reproductive: Uterus and right ovary are unremarkable in appearance. Low-attenuation lesions in the left adnexa measure up to 3.3 x 2.6 cm, presumably a combination of follicles and small cysts. Other: No significant volume of ascites.  No pneumoperitoneum. Musculoskeletal: There are no aggressive appearing lytic or blastic lesions noted in the visualized portions of the skeleton. IMPRESSION: 1. Focal hypoperfusion in the upper pole of the left kidney, which could be indicative of pyelonephritis. Correlation with urinalysis is recommended. 2. 3.3 x 2.6 cm low-attenuation lesion in the left adnexa, presumably a small ovarian cyst. No imaging follow-up is recommended. 3. Extensive post infectious scarring, volume loss and bronchiectasis in the lung  bases, similar to prior studies. 4. 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 Electronically Signed   By: Trudie Reed M.D.   On: 12/31/2021 05:19        Scheduled Meds:  bictegravir-emtricitabine-tenofovir AF  1 tablet Oral Daily   buprenorphine-naloxone  1 tablet Sublingual BID   enoxaparin (LOVENOX) injection  40 mg Subcutaneous Q24H   guaiFENesin  600 mg Oral BID   pantoprazole  40 mg Oral Daily   sodium chloride flush  3 mL Intravenous Q12H   Continuous Infusions:  sodium chloride 100 mL/hr at 01/01/22 1142   cefTRIAXone (ROCEPHIN)  IV 2 g (01/01/22 1042)     LOS: 0 days    Time spent: 35 minutes.     Alba Cory, MD [facility]   If 7PM-7AM, please contact night-coverage [email or web address]  01/01/2022, 3:36  PM

## 2022-01-02 ENCOUNTER — Inpatient Hospital Stay (HOSPITAL_COMMUNITY): Payer: Medicaid Other

## 2022-01-02 DIAGNOSIS — R9431 Abnormal electrocardiogram [ECG] [EKG]: Secondary | ICD-10-CM

## 2022-01-02 LAB — CBC
HCT: 33.7 % — ABNORMAL LOW (ref 36.0–46.0)
Hemoglobin: 10.6 g/dL — ABNORMAL LOW (ref 12.0–15.0)
MCH: 24 pg — ABNORMAL LOW (ref 26.0–34.0)
MCHC: 31.5 g/dL (ref 30.0–36.0)
MCV: 76.2 fL — ABNORMAL LOW (ref 80.0–100.0)
Platelets: 191 10*3/uL (ref 150–400)
RBC: 4.42 MIL/uL (ref 3.87–5.11)
RDW: 17.5 % — ABNORMAL HIGH (ref 11.5–15.5)
WBC: 2.3 10*3/uL — ABNORMAL LOW (ref 4.0–10.5)
nRBC: 0 % (ref 0.0–0.2)

## 2022-01-02 LAB — URINE CULTURE: Culture: 30000 — AB

## 2022-01-02 LAB — BASIC METABOLIC PANEL
Anion gap: 13 (ref 5–15)
BUN: 6 mg/dL (ref 6–20)
CO2: 26 mmol/L (ref 22–32)
Calcium: 8.2 mg/dL — ABNORMAL LOW (ref 8.9–10.3)
Chloride: 101 mmol/L (ref 98–111)
Creatinine, Ser: 0.75 mg/dL (ref 0.44–1.00)
GFR, Estimated: >60 mL/min (ref >60)
Glucose, Bld: 98 mg/dL (ref 70–99)
Potassium: 3.5 mmol/L (ref 3.5–5.1)
Sodium: 140 mmol/L (ref 135–145)

## 2022-01-02 LAB — ECHOCARDIOGRAM COMPLETE
AR max vel: 2.77 cm2
AV Area VTI: 2.72 cm2
AV Area mean vel: 2.72 cm2
AV Mean grad: 2 mmHg
AV Peak grad: 3.6 mmHg
Ao pk vel: 0.95 m/s
Area-P 1/2: 3.91 cm2
Height: 62 in
S' Lateral: 2.5 cm
Weight: 1840 oz

## 2022-01-02 LAB — MAGNESIUM: Magnesium: 2 mg/dL (ref 1.7–2.4)

## 2022-01-02 LAB — T-HELPER CELLS (CD4) COUNT (NOT AT ARMC)
CD4 % Helper T Cell: 9 % — ABNORMAL LOW (ref 33–65)
CD4 T Cell Abs: 76 /uL — ABNORMAL LOW (ref 400–1790)

## 2022-01-02 LAB — CORTISOL: Cortisol, Plasma: 4.2 ug/dL

## 2022-01-02 LAB — HIV-1 RNA QUANT-NO REFLEX-BLD
HIV 1 RNA Quant: 198000 copies/mL
LOG10 HIV-1 RNA: 5.297 log10copy/mL

## 2022-01-02 LAB — D-DIMER, QUANTITATIVE: D-Dimer, Quant: 0.79 ug/mL-FEU — ABNORMAL HIGH (ref 0.00–0.50)

## 2022-01-02 MED ORDER — DAPSONE 100 MG PO TABS
100.0000 mg | ORAL_TABLET | Freq: Every day | ORAL | Status: DC
  Administered 2022-01-02 – 2022-01-04 (×3): 100 mg via ORAL
  Filled 2022-01-02 (×4): qty 1

## 2022-01-02 MED ORDER — IOHEXOL 350 MG/ML SOLN
60.0000 mL | Freq: Once | INTRAVENOUS | Status: AC | PRN
  Administered 2022-01-02: 60 mL via INTRAVENOUS

## 2022-01-02 MED ORDER — COSYNTROPIN 0.25 MG IJ SOLR
0.2500 mg | Freq: Once | INTRAMUSCULAR | Status: DC
  Filled 2022-01-02: qty 0.25

## 2022-01-02 MED ORDER — SACCHAROMYCES BOULARDII 250 MG PO CAPS
250.0000 mg | ORAL_CAPSULE | Freq: Two times a day (BID) | ORAL | Status: DC
  Administered 2022-01-02 – 2022-01-04 (×5): 250 mg via ORAL
  Filled 2022-01-02 (×5): qty 1

## 2022-01-02 NOTE — Progress Notes (Signed)
PROGRESS NOTE    Frances Ball  XMI:[id number] DOB: Jun 29, 1990 DOA: 12/30/2021 PCP: Blanchard Kelch, NP   Brief Narrative: 31 year old with past medical history significant for HIV on HARRT , bronchiectasis, anemia, hepatitis C status posttreatment, protein caloric malnutrition presents complaining of nausea and vomiting.  Unable to tolerate oral intake.  Reports urine was neon yellow, reports some suprapubic abdominal pain.  She reports fever up to 103. Evaluation in the ED: Chest x-ray improved lower lobe [place] with no acute process.  CT scan of the abdomen and pelvis: Showed focal hypoperfusion at the left kidney indicative of possible nephritis, left small ovarian cyst, postinfectious cystic area on the right kidney.  Respiratory panel positive for rhinovirus enterovirus.   Assessment & Plan:   Principal Problem:   Nausea and vomiting Active Problems:   Bronchiectasis (HCC)   Rhinovirus/enterovirus   Pancytopenia (HCC)   SIRS (systemic inflammatory response syndrome) (HCC)   Hypoglycemia   Chest pain   Hyponatremia   Hypomagnesemia   Abnormal urinalysis   HIV (human immunodeficiency virus infection) (HCC)   Factor 5 Leiden mutation, heterozygous (HCC)   History of opioid abuse (HCC)   Intractable nausea and vomiting   1-Nausea vomiting: Suspect related to acute viral illness Report care, continue with Zofran as needed, start IV fluids. -Lipase 33.  -IV fluids -Required nausea to tolerate diet.  Plan to check cortisol level, prior history of low cortisol and ACTH test    2-Rhinovirus, history of bronchiectasis: Supportive care. Due to her prior history of pneumonia bronchiectasis will start IV ceftriaxone  3-Supra-pubic pain;  Abnormal CT scan showed hypoperfusion in the upper pole of the left kidney which could be indicative of pyelonephritis. Urine culture growing 30 K colonies, gram negative rods Received 2 doses of IV ceftriaxone. Discontinue by ID>    4-History of palpitation;  History of Near syncope.  ECHO Normal.   5-HVI; she is compliant with her meds.  Continue with HARRT.  CD4 75, ID ordering dapsone   6-Loose Stool; check GI pathogen. Start florastore.  Hypomagnesemia;Replaced IV  History of opioid Use,  Chronic Suboxon.  Pancytopenia, SIRS; in setting of viral illness.  Hyponatremia; continue with IV fluids.   Factor V Leiden Mutation;  Needs to follow up with Hematology.  Hypoglycemia; due to poor oral inatke. Continue with IV fluids.  Chest pain;  Troponin negative.  No hypoxemia.  Suspect related to bronchitis, cough  D dimer elevated, will proceed with CT angio.   Estimated body mass index is 21.03 kg/m as calculated from the following:   Height as of this encounter: 5\' 2"  (1.575 m).   Weight as of this encounter: 52.2 kg.   DVT prophylaxis: Lovenox Code Status: Full code Family Communication: Car e discussed with patient, Mother was on the phone Disposition Plan:  Status is: Inpatient Remains inpatient appropriate because: management of Viral illness, vomiting, not tolerating diet consistently     Consultants:  None  Procedures:  ECHO  Antimicrobials:    Subjective: She report needing Zofran to be able to eat. Sh report 2 episode of loose stool yesterday.    Objective: Vitals:   01/01/22 1857 01/01/22 2117 01/02/22 0541 01/02/22 0722  BP: 99/72 106/73 101/72 104/80  Pulse: 95 92 88 80  Resp: 17 18 18 16   Temp: 98.7 F (37.1 C) 98.6 F (37 C) (!) 97.3 F (36.3 C) 98.5 F (36.9 C)  TempSrc: Oral Oral Oral Oral  SpO2: 97% 98% 99% 98%  Weight:  Height:        Intake/Output Summary (Last 24 hours) at 01/02/2022 1521 Last data filed at 01/01/2022 1649 Gross per 24 hour  Intake 660.04 ml  Output --  Net 660.04 ml    Filed Weights   12/30/21 1038  Weight: 52.2 kg    Examination:  General exam: NAD Respiratory system: CTA Cardiovascular system: S 1, S 2  RRR Gastrointestinal system: BS present, soft, nt Central nervous system: Alert Extremities: no edema   Data Reviewed: I have personally reviewed following labs and imaging studies  CBC: Recent Labs  Lab 12/30/21 2330 12/31/21 0216 12/31/21 0709 01/01/22 0815 01/02/22 0319  WBC 4.7  --  2.2* 3.3* 2.3*  NEUTROABS 2.9  --  1.1*  --   --   HGB 13.5 13.3 10.4* 10.6* 10.6*  HCT 44.7 39.0 33.2* 32.9* 33.7*  MCV 80.0  --  78.7* 76.3* 76.2*  PLT 244  --  233 219 191    Basic Metabolic Panel: Recent Labs  Lab 12/31/21 0216 12/31/21 0709 01/01/22 0519 01/02/22 0319  NA 136 132* 138 140  K 4.3 3.5 4.0 3.5  CL 105 102 106 101  CO2  --  20* 21* 26  GLUCOSE 70 69* 87 98  BUN 13 9 5* 6  CREATININE 0.80 1.12* 0.87 0.75  CALCIUM  --  7.9* 7.9* 8.2*  MG  --  1.5*  --  2.0    GFR: Estimated Creatinine Clearance: 79.8 mL/min (by C-G formula based on SCr of 0.75 mg/dL). Liver Function Tests: Recent Labs  Lab 12/31/21 0709 01/01/22 0519  AST 16 19  ALT 8 9  ALKPHOS 35* 35*  BILITOT 0.8 0.4  PROT 5.7* 5.6*  ALBUMIN 2.8* 2.8*    Recent Labs  Lab 12/31/21 0709  LIPASE 33    No results for input(s): "AMMONIA" in the last 168 hours. Coagulation Profile: No results for input(s): "INR", "PROTIME" in the last 168 hours. Cardiac Enzymes: No results for input(s): "CKTOTAL", "CKMB", "CKMBINDEX", "TROPONINI" in the last 168 hours. BNP (last 3 results) No results for input(s): "PROBNP" in the last 8760 hours. HbA1C: No results for input(s): "HGBA1C" in the last 72 hours. CBG: Recent Labs  Lab 12/31/21 1204 12/31/21 1608 12/31/21 2216 01/01/22 0906  GLUCAP 72 66* 82 78    Lipid Profile: No results for input(s): "CHOL", "HDL", "LDLCALC", "TRIG", "CHOLHDL", "LDLDIRECT" in the last 72 hours. Thyroid Function Tests: No results for input(s): "TSH", "T4TOTAL", "FREET4", "T3FREE", "THYROIDAB" in the last 72 hours. Anemia Panel: No results for input(s): "VITAMINB12",  "FOLATE", "FERRITIN", "TIBC", "IRON", "RETICCTPCT" in the last 72 hours. Sepsis Labs: Recent Labs  Lab 12/30/21 2330 12/31/21 0709  PROCALCITON  --  <0.10  LATICACIDVEN 0.8  --      Recent Results (from the past 240 hour(s))  Resp Panel by RT-PCR (Flu A&B, Covid) Anterior Nasal Swab     Status: None   Collection Time: 12/30/21 11:30 PM   Specimen: Anterior Nasal Swab  Result Value Ref Range Status   SARS Coronavirus 2 by RT PCR NEGATIVE NEGATIVE Final    Comment: (NOTE) SARS-CoV-2 target nucleic acids are NOT DETECTED.  The SARS-CoV-2 RNA is generally detectable in upper respiratory specimens during the acute phase of infection. The lowest concentration of SARS-CoV-2 viral copies this assay can detect is 138 copies/mL. A negative result does not preclude SARS-Cov-2 infection and should not be used as the sole basis for treatment or other patient management decisions. A negative result  may occur with  improper specimen collection/handling, submission of specimen other than nasopharyngeal swab, presence of viral mutation(s) within the areas targeted by this assay, and inadequate number of viral copies(<138 copies/mL). A negative result must be combined with clinical observations, patient history, and epidemiological information. The expected result is Negative.  Fact Sheet for Patients:  BloggerCourse.com  Fact Sheet for Healthcare Providers:  SeriousBroker.it  This test is no t yet approved or cleared by the [place] FDA and  has been authorized for detection and/or diagnosis of SARS-CoV-2 by FDA under an Emergency Use Authorization (EUA). This EUA will remain  in effect (meaning this test can be used) for the duration of the COVID-19 declaration under Section 564(b)(1) of the Act, 21 U.S.C.section 360bbb-3(b)(1), unless the authorization is terminated  or revoked sooner.       Influenza A by PCR NEGATIVE NEGATIVE  Final   Influenza B by PCR NEGATIVE NEGATIVE Final    Comment: (NOTE) The Xpert Xpress SARS-CoV-2/FLU/RSV plus assay is intended as an aid in the diagnosis of influenza from Nasopharyngeal swab specimens and should not be used as a sole basis for treatment. Nasal washings and aspirates are unacceptable for Xpert Xpress SARS-CoV-2/FLU/RSV testing.  Fact Sheet for Patients: BloggerCourse.com  Fact Sheet for Healthcare Providers: SeriousBroker.it  This test is not yet approved or cleared by the [place] FDA and has been authorized for detection and/or diagnosis of SARS-CoV-2 by FDA under an Emergency Use Authorization (EUA). This EUA will remain in effect (meaning this test can be used) for the duration of the COVID-19 declaration under Section 564(b)(1) of the Act, 21 U.S.C. section 360bbb-3(b)(1), unless the authorization is terminated or revoked.  Performed at [facility], [address] [place], [place] [address]   Respiratory (~20 pathogens) panel by PCR     Status: Abnormal   Collection Time: 12/30/21 11:30 PM   Specimen: Anterior Nasal Swab; Respiratory  Result Value Ref Range Status   Adenovirus NOT DETECTED NOT DETECTED Final   Coronavirus 229E NOT DETECTED NOT DETECTED Final    Comment: (NOTE) The Coronavirus on the Respiratory Panel, DOES NOT test for the novel  Coronavirus (2019 nCoV)    Coronavirus HKU1 NOT DETECTED NOT DETECTED Final   Coronavirus NL63 NOT DETECTED NOT DETECTED Final   Coronavirus OC43 NOT DETECTED NOT DETECTED Final   Metapneumovirus NOT DETECTED NOT DETECTED Final   Rhinovirus / Enterovirus DETECTED (A) NOT DETECTED Final   Influenza A NOT DETECTED NOT DETECTED Final   Influenza B NOT DETECTED NOT DETECTED Final   Parainfluenza Virus 1 NOT DETECTED NOT DETECTED Final   Parainfluenza Virus 2 NOT DETECTED NOT DETECTED Final   Parainfluenza Virus 3 NOT DETECTED NOT DETECTED Final    Parainfluenza Virus 4 NOT DETECTED NOT DETECTED Final   Respiratory Syncytial Virus NOT DETECTED NOT DETECTED Final   Bordetella pertussis NOT DETECTED NOT DETECTED Final   Bordetella Parapertussis NOT DETECTED NOT DETECTED Final   Chlamydophila pneumoniae NOT DETECTED NOT DETECTED Final   Mycoplasma pneumoniae NOT DETECTED NOT DETECTED Final    Comment: Performed at [facility], [address] [place], [place] [address]  Urine Culture     Status: Abnormal   Collection Time: 12/31/21  7:31 AM   Specimen: Urine, Clean Catch  Result Value Ref Range Status   Specimen Description URINE, CLEAN CATCH  Final   Special Requests   Final    NONE Performed at [facility], [address] [place], [place] [address]    Culture 30,000 COLONIES/mL ESCHERICHIA COLI (A)  Final   Report Status 01/02/2022 FINAL  Final   Organism ID, Bacteria ESCHERICHIA COLI (A)  Final      Susceptibility   Escherichia coli - MIC*    AMPICILLIN >=32 RESISTANT Resistant     CEFAZOLIN 16 SENSITIVE Sensitive     CEFEPIME <=0.12 SENSITIVE Sensitive     CEFTRIAXONE <=0.25 SENSITIVE Sensitive     CIPROFLOXACIN >=4 RESISTANT Resistant     GENTAMICIN <=1 SENSITIVE Sensitive     IMIPENEM <=0.25 SENSITIVE Sensitive     NITROFURANTOIN <=16 SENSITIVE Sensitive     TRIMETH/SULFA <=20 SENSITIVE Sensitive     AMPICILLIN/SULBACTAM >=32 RESISTANT Resistant     PIP/TAZO 64 INTERMEDIATE Intermediate     * 30,000 COLONIES/mL ESCHERICHIA COLI         Radiology Studies: ECHOCARDIOGRAM COMPLETE  Result Date: 01/02/2022    ECHOCARDIOGRAM REPORT   Patient Name:   Frances Ball Date of Exam: 01/02/2022 Medical Rec #:  [id number]       Height:       62.0 in Accession #:    [id number]      Weight:       115.0 lb Date of Birth:  10/19/1990        BSA:          1.511 m Patient Age:    31 years        BP:           104/80 mmHg Patient Gender: F               HR:           93 bpm. Exam Location:  Inpatient Procedure: 2D Echo,  Cardiac Doppler and Color Doppler Indications:    Abnormal ECG R94.31  History:        Patient has no prior history of Echocardiogram examinations.                 Signs/Symptoms:Chest Pain.  Sonographer:    Lucendia Herrlich Referring Phys: [id number] Raylen Ken A Ocean Schildt  Sonographer Comments: Technically difficult study due to poor echo windows and suboptimal parasternal window. IMPRESSIONS  1. Left ventricular ejection fraction, by estimation, is 60 to 65%. The left ventricle has normal function. The left ventricle has no regional wall motion abnormalities. Left ventricular diastolic parameters were normal.  2. Right ventricular systolic function is normal. The right ventricular size is normal.  3. The mitral valve is normal in structure. No evidence of mitral valve regurgitation. No evidence of mitral stenosis.  4. The aortic valve was not well visualized. Aortic valve regurgitation is not visualized. No aortic stenosis is present.  5. The inferior vena cava is normal in size with greater than 50% respiratory variability, suggesting right atrial pressure of 3 mmHg. FINDINGS  Left Ventricle: Left ventricular ejection fraction, by estimation, is 60 to 65%. The left ventricle has normal function. The left ventricle has no regional wall motion abnormalities. The left ventricular internal cavity size was normal in size. There is  no left ventricular hypertrophy. Left ventricular diastolic parameters were normal. Right Ventricle: The right ventricular size is normal. Right ventricular systolic function is normal. Left Atrium: Left atrial size was normal in size. Right Atrium: Right atrial size was normal in size. Pericardium: There is no evidence of pericardial effusion. Mitral Valve: The mitral valve is normal in structure. No evidence of mitral valve regurgitation. No evidence  of mitral valve stenosis. Tricuspid Valve: The tricuspid valve is normal in structure. Tricuspid valve regurgitation is trivial. No evidence of  tricuspid stenosis. Aortic Valve: The aortic valve was not well visualized. Aortic valve regurgitation is not visualized. No aortic stenosis is present. Aortic valve mean gradient measures 2.0 mmHg. Aortic valve peak gradient measures 3.6 mmHg. Aortic valve area, by VTI measures 2.72 cm. Pulmonic Valve: The pulmonic valve was not well visualized. Pulmonic valve regurgitation is not visualized. No evidence of pulmonic stenosis. Aorta: The aortic root is normal in size and structure. Venous: The inferior vena cava is normal in size with greater than 50% respiratory variability, suggesting right atrial pressure of 3 mmHg. IAS/Shunts: No atrial level shunt detected by color flow Doppler.  LEFT VENTRICLE PLAX 2D LVIDd:         3.90 cm   Diastology LVIDs:         2.50 cm   LV e' medial:    12.00 cm/s LV PW:         0.70 cm   LV E/e' medial:  7.2 LV IVS:        0.60 cm   LV e' lateral:   12.60 cm/s LVOT diam:     1.90 cm   LV E/e' lateral: 6.9 LV SV:         47 LV SV Index:   31 LVOT Area:     2.84 cm  RIGHT VENTRICLE             IVC RV S prime:     10.70 cm/s  IVC diam: 1.00 cm TAPSE (M-mode): 1.6 cm LEFT ATRIUM             Index        RIGHT ATRIUM          Index LA diam:        2.30 cm 1.52 cm/m   RA Area:     8.94 cm LA Vol (A2C):   27.7 ml 18.33 ml/m  RA Volume:   17.10 ml 11.32 ml/m LA Vol (A4C):   22.3 ml 14.76 ml/m LA Biplane Vol: 25.7 ml 17.01 ml/m  AORTIC VALVE AV Area (Vmax):    2.77 cm AV Area (Vmean):   2.72 cm AV Area (VTI):     2.72 cm AV Vmax:           94.70 cm/s AV Vmean:          65.200 cm/s AV VTI:            0.173 m AV Peak Grad:      3.6 mmHg AV Mean Grad:      2.0 mmHg LVOT Vmax:         92.50 cm/s LVOT Vmean:        62.600 cm/s LVOT VTI:          0.166 m LVOT/AV VTI ratio: 0.96  AORTA Ao Root diam: 3.00 cm MITRAL VALVE MV Area (PHT): 3.91 cm    SHUNTS MV Decel Time: 194 msec    Systemic VTI:  0.17 m MV E velocity: 87.00 cm/s  Systemic Diam: 1.90 cm MV A velocity: 49.30 cm/s MV E/A ratio:   1.76 Olga MillersBrian Crenshaw MD Electronically signed by Olga MillersBrian Crenshaw MD Signature Date/Time: 01/02/2022/1:46:00 PM    Final         Scheduled Meds:  bictegravir-emtricitabine-tenofovir AF  1 tablet Oral Daily   buprenorphine-naloxone  1 tablet Sublingual BID   [START ON 01/03/2022]  cosyntropin  0.25 mg Intravenous Once   dapsone  100 mg Oral Daily   enoxaparin (LOVENOX) injection  40 mg Subcutaneous Q24H   guaiFENesin  600 mg Oral BID   pantoprazole  40 mg Oral Daily   saccharomyces boulardii  250 mg Oral BID   sodium chloride flush  3 mL Intravenous Q12H   Continuous Infusions:  sodium chloride 100 mL/hr at 01/01/22 1142     LOS: 1 day    Time spent: 35 minutes.     Alba Cory, MD [facility]   If 7PM-7AM, please contact night-coverage [email or web address]  01/02/2022, 3:21 PM

## 2022-01-02 NOTE — Progress Notes (Addendum)
ID Brief Progress Note for Continuity of Care   Checked in with [facility] during acute hospitalization as I have not seen her in a few months for regular HIV care follow up.   Urine culture has very low growth of e coli, but UA not c/w infection representing colonization not infection.  PCT normal also supporting all viral illness causing present symptoms. Positive for rhinovirus on RVP. Non-hypoxic. CT with scarring to left lower lobe c/w her known bronchiectasis.   CD4 has dropped with acute illness to 76 (9%). Will re-initiate dapsone 100 mg daily for prophy x 3 months until and we can re-evaluate outpatient. Could be impacted largely from concurrent unrelated illness. HIV VL is pending but she has been filling her Biktarvy regularly since February of this year; she also looks to have regained some weight since I last saw her.   Will follow viral load outpatient an help to arrange follow up with me in 2 weeks after her D/C.    It is a bit out of context that she has ongoing n/v.  Of thought, she had low fasting cortisol during last hospitalization (< 2) requiring low dose dexamethasone. I have tried to get her to endo for follow up on this and rule out of addison's (fatigue, weight loss, variable blood pressures/orthostatics and now pre-syncope).  Will d/w Dr. Sunnie Nielsen more to get her opinion on whether it is worth re-drawing fasting cortisol and starting back low dose dex? Maybe current illness prompted her to be more symptomatic for potential undiagnosed endocrine condition.    Rexene Alberts, MSN, NP-C [facility] for Infectious Disease [facility]  [email or web address] Pager: [phone] Office: [phone] [facility] Main Line: [phone] *Secure Chat Communication Welcome

## 2022-01-02 NOTE — Progress Notes (Incomplete)
Echocardiogram 2D Echocardiogram has been performed.  Frances Ball 01/02/2022, 1:34 PM

## 2022-01-03 ENCOUNTER — Inpatient Hospital Stay (HOSPITAL_COMMUNITY): Payer: Medicaid Other

## 2022-01-03 DIAGNOSIS — R7989 Other specified abnormal findings of blood chemistry: Secondary | ICD-10-CM

## 2022-01-03 MED ORDER — RIVAROXABAN 10 MG PO TABS
10.0000 mg | ORAL_TABLET | Freq: Every day | ORAL | Status: DC
  Administered 2022-01-03 – 2022-01-04 (×2): 10 mg via ORAL
  Filled 2022-01-03 (×2): qty 1

## 2022-01-03 MED ORDER — COSYNTROPIN 0.25 MG IJ SOLR
0.2500 mg | Freq: Once | INTRAMUSCULAR | Status: AC
  Administered 2022-01-04: 0.25 mg via INTRAVENOUS
  Filled 2022-01-03: qty 0.25

## 2022-01-03 MED ORDER — SODIUM CHLORIDE 0.9 % IV SOLN
INTRAVENOUS | Status: DC
  Administered 2022-01-03: 999 mL via INTRAVENOUS

## 2022-01-03 MED ORDER — STERILE WATER FOR INJECTION IJ SOLN
INTRAMUSCULAR | Status: AC
  Filled 2022-01-03: qty 10

## 2022-01-03 NOTE — Progress Notes (Signed)
PROGRESS NOTE    Frances SPRUIELL  PRF:[id number] DOB: 16-Apr-1990 DOA: 12/30/2021 PCP: Blanchard Kelch, NP   Brief Narrative: 31 year old with past medical history significant for HIV on HARRT , bronchiectasis, anemia, hepatitis C status posttreatment, protein caloric malnutrition presents complaining of nausea and vomiting.  Unable to tolerate oral intake.  Reports urine was neon yellow, reports some suprapubic abdominal pain.  She reports fever up to 103. Evaluation in the ED: Chest x-ray improved lower lobe [place] with no acute process.  CT scan of the abdomen and pelvis: Showed focal hypoperfusion at the left kidney indicative of possible nephritis, left small ovarian cyst, postinfectious cystic area on the right kidney.  Respiratory panel positive for rhinovirus enterovirus.   Assessment & Plan:   Principal Problem:   Nausea and vomiting Active Problems:   Bronchiectasis (HCC)   Rhinovirus/enterovirus   Pancytopenia (HCC)   SIRS (systemic inflammatory response syndrome) (HCC)   Hypoglycemia   Chest pain   Hyponatremia   Hypomagnesemia   Abnormal urinalysis   HIV (human immunodeficiency virus infection) (HCC)   Factor 5 Leiden mutation, heterozygous (HCC)   History of opioid abuse (HCC)   Intractable nausea and vomiting   1-Nausea vomiting: Suspect related to acute viral illness Report care, continue with Zofran as needed, start IV fluids. -Lipase 33.  -IV fluids -Required nausea to tolerate diet.  Unfortunately Cortisol level was not obtain this AM. Patient hard stick. Will give IV fluids overnight and repeat ACTH test.   2-Rhinovirus, history of bronchiectasis: Supportive care. ID recommend to discontinue antibiotics.   3-Supra-pubic pain;  Abnormal CT scan showed hypoperfusion in the upper pole of the left kidney which could be indicative of pyelonephritis. Urine culture growing 30 K colonies, gram negative rods Received 2 doses of IV ceftriaxone. Discontinue  by ID>   4-History of palpitation;  History of Near syncope.  ECHO Normal.  ACTH test ordered.   5-HVI; she is compliant with her meds.  Continue with HARRT.  CD4 75, ID ordering dapsone   6-Loose Stool; check GI pathogen. Started florastore.  Hypomagnesemia;Replaced IV  History of opioid Use,  Chronic Suboxon.  Pancytopenia, SIRS; in setting of viral illness.  Hyponatremia; continue with IV fluids.   Factor V Leiden Mutation;  Needs to follow up with Hematology.  Hypoglycemia; due to poor oral inatke. Continue with IV fluids.  Chest pain;  Troponin negative.  No hypoxemia.  Suspect related to bronchitis, cough  D dimer elevated, CTA negative for PE, showed PNA. Doppler negative.      Estimated body mass index is 21.03 kg/m as calculated from the following:   Height as of this encounter: 5\' 2"  (1.575 m).   Weight as of this encounter: 52.2 kg.   DVT prophylaxis: Lovenox Code Status: Full code Family Communication: Car e discussed with patient, Mother was on the phone Disposition Plan:  Status is: Inpatient Remains inpatient appropriate because: management of Viral illness, vomiting, not tolerating diet consistently     Consultants:  None  Procedures:  ECHO  Antimicrobials:    Subjective: Still having nausea, required Zofran. Lab couldn't get blood today.  She report headaches  Objective: Vitals:   01/02/22 2237 01/03/22 0435 01/03/22 0756 01/03/22 1557  BP: 105/76 108/69 111/85 106/80  Pulse: 74 80 82 76  Resp: 18 17 18 18   Temp: 98.6 F (37 C) 98.4 F (36.9 C) 98.3 F (36.8 C) 98.2 F (36.8 C)  TempSrc: Oral Oral Oral   SpO2: 99% 99%  100% 100%  Weight:      Height:        Intake/Output Summary (Last 24 hours) at 01/03/2022 1742 Last data filed at 01/03/2022 1300 Gross per 24 hour  Intake 400 ml  Output --  Net 400 ml    Filed Weights   12/30/21 1038  Weight: 52.2 kg    Examination:  General exam: NAD Respiratory system:  CTA Cardiovascular system: S 1, S 2  RRR Gastrointestinal system: BS present, soft, nt Central nervous system: Alert Extremities: no edema   Data Reviewed: I have personally reviewed following labs and imaging studies  CBC: Recent Labs  Lab 12/30/21 2330 12/31/21 0216 12/31/21 0709 01/01/22 0815 01/02/22 0319  WBC 4.7  --  2.2* 3.3* 2.3*  NEUTROABS 2.9  --  1.1*  --   --   HGB 13.5 13.3 10.4* 10.6* 10.6*  HCT 44.7 39.0 33.2* 32.9* 33.7*  MCV 80.0  --  78.7* 76.3* 76.2*  PLT 244  --  233 219 191    Basic Metabolic Panel: Recent Labs  Lab 12/31/21 0216 12/31/21 0709 01/01/22 0519 01/02/22 0319  NA 136 132* 138 140  K 4.3 3.5 4.0 3.5  CL 105 102 106 101  CO2  --  20* 21* 26  GLUCOSE 70 69* 87 98  BUN 13 9 5* 6  CREATININE 0.80 1.12* 0.87 0.75  CALCIUM  --  7.9* 7.9* 8.2*  MG  --  1.5*  --  2.0    GFR: Estimated Creatinine Clearance: 79.8 mL/min (by C-G formula based on SCr of 0.75 mg/dL). Liver Function Tests: Recent Labs  Lab 12/31/21 0709 01/01/22 0519  AST 16 19  ALT 8 9  ALKPHOS 35* 35*  BILITOT 0.8 0.4  PROT 5.7* 5.6*  ALBUMIN 2.8* 2.8*    Recent Labs  Lab 12/31/21 0709  LIPASE 33    No results for input(s): "AMMONIA" in the last 168 hours. Coagulation Profile: No results for input(s): "INR", "PROTIME" in the last 168 hours. Cardiac Enzymes: No results for input(s): "CKTOTAL", "CKMB", "CKMBINDEX", "TROPONINI" in the last 168 hours. BNP (last 3 results) No results for input(s): "PROBNP" in the last 8760 hours. HbA1C: No results for input(s): "HGBA1C" in the last 72 hours. CBG: Recent Labs  Lab 12/31/21 1204 12/31/21 1608 12/31/21 2216 01/01/22 0906  GLUCAP 72 66* 82 78    Lipid Profile: No results for input(s): "CHOL", "HDL", "LDLCALC", "TRIG", "CHOLHDL", "LDLDIRECT" in the last 72 hours. Thyroid Function Tests: No results for input(s): "TSH", "T4TOTAL", "FREET4", "T3FREE", "THYROIDAB" in the last 72 hours. Anemia Panel: No  results for input(s): "VITAMINB12", "FOLATE", "FERRITIN", "TIBC", "IRON", "RETICCTPCT" in the last 72 hours. Sepsis Labs: Recent Labs  Lab 12/30/21 2330 12/31/21 0709  PROCALCITON  --  <0.10  LATICACIDVEN 0.8  --      Recent Results (from the past 240 hour(s))  Resp Panel by RT-PCR (Flu A&B, Covid) Anterior Nasal Swab     Status: None   Collection Time: 12/30/21 11:30 PM   Specimen: Anterior Nasal Swab  Result Value Ref Range Status   SARS Coronavirus 2 by RT PCR NEGATIVE NEGATIVE Final    Comment: (NOTE) SARS-CoV-2 target nucleic acids are NOT DETECTED.  The SARS-CoV-2 RNA is generally detectable in upper respiratory specimens during the acute phase of infection. The lowest concentration of SARS-CoV-2 viral copies this assay can detect is 138 copies/mL. A negative result does not preclude SARS-Cov-2 infection and should not be used as the sole basis  for treatment or other patient management decisions. A negative result may occur with  improper specimen collection/handling, submission of specimen other than nasopharyngeal swab, presence of viral mutation(s) within the areas targeted by this assay, and inadequate number of viral copies(<138 copies/mL). A negative result must be combined with clinical observations, patient history, and epidemiological information. The expected result is Negative.  Fact Sheet for Patients:  BloggerCourse.com  Fact Sheet for Healthcare Providers:  SeriousBroker.it  This test is no t yet approved or cleared by the [place] FDA and  has been authorized for detection and/or diagnosis of SARS-CoV-2 by FDA under an Emergency Use Authorization (EUA). This EUA will remain  in effect (meaning this test can be used) for the duration of the COVID-19 declaration under Section 564(b)(1) of the Act, 21 U.S.C.section 360bbb-3(b)(1), unless the authorization is terminated  or revoked sooner.        Influenza A by PCR NEGATIVE NEGATIVE Final   Influenza B by PCR NEGATIVE NEGATIVE Final    Comment: (NOTE) The Xpert Xpress SARS-CoV-2/FLU/RSV plus assay is intended as an aid in the diagnosis of influenza from Nasopharyngeal swab specimens and should not be used as a sole basis for treatment. Nasal washings and aspirates are unacceptable for Xpert Xpress SARS-CoV-2/FLU/RSV testing.  Fact Sheet for Patients: BloggerCourse.com  Fact Sheet for Healthcare Providers: SeriousBroker.it  This test is not yet approved or cleared by the [place] FDA and has been authorized for detection and/or diagnosis of SARS-CoV-2 by FDA under an Emergency Use Authorization (EUA). This EUA will remain in effect (meaning this test can be used) for the duration of the COVID-19 declaration under Section 564(b)(1) of the Act, 21 U.S.C. section 360bbb-3(b)(1), unless the authorization is terminated or revoked.  Performed at [facility], [address], [place], [place] [address]   Respiratory (~20 pathogens) panel by PCR     Status: Abnormal   Collection Time: 12/30/21 11:30 PM   Specimen: Anterior Nasal Swab; Respiratory  Result Value Ref Range Status   Adenovirus NOT DETECTED NOT DETECTED Final   Coronavirus 229E NOT DETECTED NOT DETECTED Final    Comment: (NOTE) The Coronavirus on the Respiratory Panel, DOES NOT test for the novel  Coronavirus (2019 nCoV)    Coronavirus HKU1 NOT DETECTED NOT DETECTED Final   Coronavirus NL63 NOT DETECTED NOT DETECTED Final   Coronavirus OC43 NOT DETECTED NOT DETECTED Final   Metapneumovirus NOT DETECTED NOT DETECTED Final   Rhinovirus / Enterovirus DETECTED (A) NOT DETECTED Final   Influenza A NOT DETECTED NOT DETECTED Final   Influenza B NOT DETECTED NOT DETECTED Final   Parainfluenza Virus 1 NOT DETECTED NOT DETECTED Final   Parainfluenza Virus 2 NOT DETECTED NOT DETECTED Final   Parainfluenza Virus 3  NOT DETECTED NOT DETECTED Final   Parainfluenza Virus 4 NOT DETECTED NOT DETECTED Final   Respiratory Syncytial Virus NOT DETECTED NOT DETECTED Final   Bordetella pertussis NOT DETECTED NOT DETECTED Final   Bordetella Parapertussis NOT DETECTED NOT DETECTED Final   Chlamydophila pneumoniae NOT DETECTED NOT DETECTED Final   Mycoplasma pneumoniae NOT DETECTED NOT DETECTED Final    Comment: Performed at [facility], [address], [place], [place] [address]  Urine Culture     Status: Abnormal   Collection Time: 12/31/21  7:31 AM   Specimen: Urine, Clean Catch  Result Value Ref Range Status   Specimen Description URINE, CLEAN CATCH  Final   Special Requests   Final  NONE Performed at [facility], [address], [place], [place] [address]    Culture 30,000 COLONIES/mL ESCHERICHIA COLI (A)  Final   Report Status 01/02/2022 FINAL  Final   Organism ID, Bacteria ESCHERICHIA COLI (A)  Final      Susceptibility   Escherichia coli - MIC*    AMPICILLIN >=32 RESISTANT Resistant     CEFAZOLIN 16 SENSITIVE Sensitive     CEFEPIME <=0.12 SENSITIVE Sensitive     CEFTRIAXONE <=0.25 SENSITIVE Sensitive     CIPROFLOXACIN >=4 RESISTANT Resistant     GENTAMICIN <=1 SENSITIVE Sensitive     IMIPENEM <=0.25 SENSITIVE Sensitive     NITROFURANTOIN <=16 SENSITIVE Sensitive     TRIMETH/SULFA <=20 SENSITIVE Sensitive     AMPICILLIN/SULBACTAM >=32 RESISTANT Resistant     PIP/TAZO 64 INTERMEDIATE Intermediate     * 30,000 COLONIES/mL ESCHERICHIA COLI         Radiology Studies: VAS [place] LOWER EXTREMITY VENOUS (DVT)  Result Date: 01/03/2022  Lower Venous DVT Study Patient Name:  VELECIA OVITT  Date of Exam:   01/03/2022 Medical Rec #: [id number]        Accession #:    [id number] Date of Birth: September 24, 1990         Patient Gender: F Patient Age:   5 years Exam Location:  [facility] Procedure:      VAS [place] LOWER EXTREMITY VENOUS (DVT) Referring Phys: Jon Billings Zelpha Messing  --------------------------------------------------------------------------------  Indications: Elevated D-Dimer, Factor V Leiden.  Comparison Study: No prior study on file Performing Technologist: Sherren Kerns RVS  Examination Guidelines: A complete evaluation includes B-mode imaging, spectral Doppler, color Doppler, and power Doppler as needed of all accessible portions of each vessel. Bilateral testing is considered an integral part of a complete examination. Limited examinations for reoccurring indications may be performed as noted. The reflux portion of the exam is performed with the patient in reverse Trendelenburg.  +---------+---------------+---------+-----------+----------+--------------+ RIGHT    CompressibilityPhasicitySpontaneityPropertiesThrombus Aging +---------+---------------+---------+-----------+----------+--------------+ CFV      Full           Yes      Yes                                 +---------+---------------+---------+-----------+----------+--------------+ SFJ      Full                                                        +---------+---------------+---------+-----------+----------+--------------+ FV Prox  Full                                                        +---------+---------------+---------+-----------+----------+--------------+ FV Mid   Full                                                        +---------+---------------+---------+-----------+----------+--------------+ FV DistalFull                                                        +---------+---------------+---------+-----------+----------+--------------+  PFV      Full                                                        +---------+---------------+---------+-----------+----------+--------------+ POP      Full           Yes      Yes                                 +---------+---------------+---------+-----------+----------+--------------+ PTV      Full                                                         +---------+---------------+---------+-----------+----------+--------------+ PERO     Full                                                        +---------+---------------+---------+-----------+----------+--------------+   +---------+---------------+---------+-----------+----------+--------------+ LEFT     CompressibilityPhasicitySpontaneityPropertiesThrombus Aging +---------+---------------+---------+-----------+----------+--------------+ CFV      Full           Yes      Yes                                 +---------+---------------+---------+-----------+----------+--------------+ SFJ      Full                                                        +---------+---------------+---------+-----------+----------+--------------+ FV Prox  Full                                                        +---------+---------------+---------+-----------+----------+--------------+ FV Mid   Full                                                        +---------+---------------+---------+-----------+----------+--------------+ FV DistalFull                                                        +---------+---------------+---------+-----------+----------+--------------+ PFV      Full                                                        +---------+---------------+---------+-----------+----------+--------------+  POP      Full           Yes      Yes                                 +---------+---------------+---------+-----------+----------+--------------+ PTV      Full                                                        +---------+---------------+---------+-----------+----------+--------------+ PERO     Full                                                        +---------+---------------+---------+-----------+----------+--------------+     Summary: BILATERAL: - No evidence of deep vein thrombosis seen in the  lower extremities, bilaterally. -No evidence of popliteal cyst, bilaterally.   *See table(s) above for measurements and observations.    Preliminary    CT Angio Chest Pulmonary Embolism (PE) W or WO Contrast  Result Date: 01/02/2022 CLINICAL DATA:  High probability for PE. EXAM: CT ANGIOGRAPHY CHEST WITH CONTRAST TECHNIQUE: Multidetector CT imaging of the chest was performed using the standard protocol during bolus administration of intravenous contrast. Multiplanar CT image reconstructions and MIPs were obtained to evaluate the vascular anatomy. RADIATION DOSE REDUCTION: This exam was performed according to the departmental dose-optimization program which includes automated exposure control, adjustment of the mA and/or kV according to patient size and/or use of iterative reconstruction technique. CONTRAST:  60mL OMNIPAQUE IOHEXOL 350 MG/ML SOLN COMPARISON:  CT chest abdomen and pelvis 06/25/2021. CT chest 11/30/2018 FINDINGS: Cardiovascular: Satisfactory opacification of the pulmonary arteries to the segmental level. No evidence of pulmonary embolism. Normal heart size. No pericardial effusion. Mediastinum/Nodes: No enlarged mediastinal, hilar, or axillary lymph nodes. Thyroid gland, trachea, and esophagus demonstrate no significant findings. Lungs/Pleura: There is some mild patchy airspace and ground-glass opacity in the left lower lobe. 6 mm ground-glass nodule in the right upper lobe image 7/27 is stable. No new pulmonary nodules, pleural effusion or pneumothorax. Upper Abdomen: No acute abnormality. Musculoskeletal: No chest wall abnormality. No acute or significant osseous findings. Review of the MIP images confirms the above findings. IMPRESSION: 1. No evidence for pulmonary embolism. 2. Mild patchy airspace and ground-glass opacity in the left lower lobe worrisome for pneumonia. Recommend follow-up imaging to confirm complete resolution. 3. Stable 6 mm ground-glass nodule in the right upper lobe. Given  stability over 3 years this is favored as benign. Electronically Signed   By: Darliss Cheney M.D.   On: 01/02/2022 18:23   ECHOCARDIOGRAM COMPLETE  Result Date: 01/02/2022    ECHOCARDIOGRAM REPORT   Patient Name:   EMIRETH COCKERHAM Date of Exam: 01/02/2022 Medical Rec #:  [id number]       Height:       62.0 in Accession #:    [id number]      Weight:       115.0 lb Date of Birth:  01/22/1991        BSA:          1.511 m Patient Age:    31 years  BP:           104/80 mmHg Patient Gender: F               HR:           93 bpm. Exam Location:  Inpatient Procedure: 2D Echo, Cardiac Doppler and Color Doppler Indications:    Abnormal ECG R94.31  History:        Patient has no prior history of Echocardiogram examinations.                 Signs/Symptoms:Chest Pain.  Sonographer:    Lucendia Herrlich Referring Phys: [id number] Alok Minshall A Timaya Bojarski  Sonographer Comments: Technically difficult study due to poor echo windows and suboptimal parasternal window. IMPRESSIONS  1. Left ventricular ejection fraction, by estimation, is 60 to 65%. The left ventricle has normal function. The left ventricle has no regional wall motion abnormalities. Left ventricular diastolic parameters were normal.  2. Right ventricular systolic function is normal. The right ventricular size is normal.  3. The mitral valve is normal in structure. No evidence of mitral valve regurgitation. No evidence of mitral stenosis.  4. The aortic valve was not well visualized. Aortic valve regurgitation is not visualized. No aortic stenosis is present.  5. The inferior vena cava is normal in size with greater than 50% respiratory variability, suggesting right atrial pressure of 3 mmHg. FINDINGS  Left Ventricle: Left ventricular ejection fraction, by estimation, is 60 to 65%. The left ventricle has normal function. The left ventricle has no regional wall motion abnormalities. The left ventricular internal cavity size was normal in size. There is  no left ventricular  hypertrophy. Left ventricular diastolic parameters were normal. Right Ventricle: The right ventricular size is normal. Right ventricular systolic function is normal. Left Atrium: Left atrial size was normal in size. Right Atrium: Right atrial size was normal in size. Pericardium: There is no evidence of pericardial effusion. Mitral Valve: The mitral valve is normal in structure. No evidence of mitral valve regurgitation. No evidence of mitral valve stenosis. Tricuspid Valve: The tricuspid valve is normal in structure. Tricuspid valve regurgitation is trivial. No evidence of tricuspid stenosis. Aortic Valve: The aortic valve was not well visualized. Aortic valve regurgitation is not visualized. No aortic stenosis is present. Aortic valve mean gradient measures 2.0 mmHg. Aortic valve peak gradient measures 3.6 mmHg. Aortic valve area, by VTI measures 2.72 cm. Pulmonic Valve: The pulmonic valve was not well visualized. Pulmonic valve regurgitation is not visualized. No evidence of pulmonic stenosis. Aorta: The aortic root is normal in size and structure. Venous: The inferior vena cava is normal in size with greater than 50% respiratory variability, suggesting right atrial pressure of 3 mmHg. IAS/Shunts: No atrial level shunt detected by color flow Doppler.  LEFT VENTRICLE PLAX 2D LVIDd:         3.90 cm   Diastology LVIDs:         2.50 cm   LV e' medial:    12.00 cm/s LV PW:         0.70 cm   LV E/e' medial:  7.2 LV IVS:        0.60 cm   LV e' lateral:   12.60 cm/s LVOT diam:     1.90 cm   LV E/e' lateral: 6.9 LV SV:         47 LV SV Index:   31 LVOT Area:     2.84 cm  RIGHT VENTRICLE  IVC RV S prime:     10.70 cm/s  IVC diam: 1.00 cm TAPSE (M-mode): 1.6 cm LEFT ATRIUM             Index        RIGHT ATRIUM          Index LA diam:        2.30 cm 1.52 cm/m   RA Area:     8.94 cm LA Vol (A2C):   27.7 ml 18.33 ml/m  RA Volume:   17.10 ml 11.32 ml/m LA Vol (A4C):   22.3 ml 14.76 ml/m LA Biplane Vol: 25.7 ml  17.01 ml/m  AORTIC VALVE AV Area (Vmax):    2.77 cm AV Area (Vmean):   2.72 cm AV Area (VTI):     2.72 cm AV Vmax:           94.70 cm/s AV Vmean:          65.200 cm/s AV VTI:            0.173 m AV Peak Grad:      3.6 mmHg AV Mean Grad:      2.0 mmHg LVOT Vmax:         92.50 cm/s LVOT Vmean:        62.600 cm/s LVOT VTI:          0.166 m LVOT/AV VTI ratio: 0.96  AORTA Ao Root diam: 3.00 cm MITRAL VALVE MV Area (PHT): 3.91 cm    SHUNTS MV Decel Time: 194 msec    Systemic VTI:  0.17 m MV E velocity: 87.00 cm/s  Systemic Diam: 1.90 cm MV A velocity: 49.30 cm/s MV E/A ratio:  1.76 Olga Millers MD Electronically signed by Olga Millers MD Signature Date/Time: 01/02/2022/1:46:00 PM    Final         Scheduled Meds:  bictegravir-emtricitabine-tenofovir AF  1 tablet Oral Daily   buprenorphine-naloxone  1 tablet Sublingual BID   [START ON 01/04/2022] cosyntropin  0.25 mg Intravenous Once   dapsone  100 mg Oral Daily   guaiFENesin  600 mg Oral BID   pantoprazole  40 mg Oral Daily   rivaroxaban  10 mg Oral Daily   saccharomyces boulardii  250 mg Oral BID   sodium chloride flush  3 mL Intravenous Q12H   sterile water (preservative free)       Continuous Infusions:  sodium chloride Stopped (01/03/22 1336)   sodium chloride 100 mL/hr at 01/03/22 1338     LOS: 2 days    Time spent: 35 minutes.     Alba Cory, MD [facility]   If 7PM-7AM, please contact night-coverage [email or web address]  01/03/2022, 5:42 PM

## 2022-01-03 NOTE — Plan of Care (Signed)

## 2022-01-03 NOTE — Progress Notes (Signed)
VASCULAR LAB    Bilateral lower extremity venous duplex has been performed.  See CV proc for preliminary results.   Lekendrick Alpern, RVT 01/03/2022, 2:07 PM

## 2022-01-03 NOTE — Plan of Care (Signed)
Problem: Education: Goal: Ability to describe self-care measures that may prevent or decrease complications (Diabetes Survival Skills Education) will improve 01/03/2022 2213 by Hortencia Pilar, RN Outcome: Progressing 01/03/2022 2211 by Hortencia Pilar, RN Outcome: Progressing Goal: Individualized Educational Video(s) 01/03/2022 2213 by Hortencia Pilar, RN Outcome: Progressing 01/03/2022 2211 by Hortencia Pilar, RN Outcome: Progressing   Problem: Coping: Goal: Ability to adjust to condition or change in health will improve 01/03/2022 2213 by Hortencia Pilar, RN Outcome: Progressing 01/03/2022 2211 by Hortencia Pilar, RN Outcome: Progressing   Problem: Fluid Volume: Goal: Ability to maintain a balanced intake and output will improve 01/03/2022 2213 by Hortencia Pilar, RN Outcome: Progressing 01/03/2022 2211 by Hortencia Pilar, RN Outcome: Progressing   Problem: Health Behavior/Discharge Planning: Goal: Ability to identify and utilize available resources and services will improve 01/03/2022 2213 by Hortencia Pilar, RN Outcome: Progressing 01/03/2022 2211 by Hortencia Pilar, RN Outcome: Progressing Goal: Ability to manage health-related needs will improve 01/03/2022 2213 by Hortencia Pilar, RN Outcome: Progressing 01/03/2022 2211 by Hortencia Pilar, RN Outcome: Progressing   Problem: Metabolic: Goal: Ability to maintain appropriate glucose levels will improve 01/03/2022 2213 by Hortencia Pilar, RN Outcome: Progressing 01/03/2022 2211 by Hortencia Pilar, RN Outcome: Progressing   Problem: Nutritional: Goal: Maintenance of adequate nutrition will improve 01/03/2022 2213 by Hortencia Pilar, RN Outcome: Progressing 01/03/2022 2211 by Hortencia Pilar, RN Outcome: Progressing Goal: Progress toward achieving an optimal weight will improve 01/03/2022 2213 by Hortencia Pilar, RN Outcome: Progressing 01/03/2022 2211 by Hortencia Pilar, RN Outcome: Progressing   Problem:  Skin Integrity: Goal: Risk for impaired skin integrity will decrease 01/03/2022 2213 by Hortencia Pilar, RN Outcome: Progressing 01/03/2022 2211 by Hortencia Pilar, RN Outcome: Progressing   Problem: Tissue Perfusion: Goal: Adequacy of tissue perfusion will improve 01/03/2022 2213 by Hortencia Pilar, RN Outcome: Progressing 01/03/2022 2211 by Hortencia Pilar, RN Outcome: Progressing   Problem: Education: Goal: Knowledge of General Education information will improve Description: Including pain rating scale, medication(s)/side effects and non-pharmacologic comfort measures 01/03/2022 2213 by Hortencia Pilar, RN Outcome: Progressing 01/03/2022 2211 by Hortencia Pilar, RN Outcome: Progressing   Problem: Health Behavior/Discharge Planning: Goal: Ability to manage health-related needs will improve 01/03/2022 2213 by Hortencia Pilar, RN Outcome: Progressing 01/03/2022 2211 by Hortencia Pilar, RN Outcome: Progressing   Problem: Clinical Measurements: Goal: Ability to maintain clinical measurements within normal limits will improve 01/03/2022 2213 by Hortencia Pilar, RN Outcome: Progressing 01/03/2022 2211 by Hortencia Pilar, RN Outcome: Progressing Goal: Will remain free from infection 01/03/2022 2213 by Hortencia Pilar, RN Outcome: Progressing 01/03/2022 2211 by Hortencia Pilar, RN Outcome: Progressing Goal: Diagnostic test results will improve 01/03/2022 2213 by Hortencia Pilar, RN Outcome: Progressing 01/03/2022 2211 by Hortencia Pilar, RN Outcome: Progressing Goal: Respiratory complications will improve 01/03/2022 2213 by Hortencia Pilar, RN Outcome: Progressing 01/03/2022 2211 by Hortencia Pilar, RN Outcome: Progressing Goal: Cardiovascular complication will be avoided 01/03/2022 2213 by Hortencia Pilar, RN Outcome: Progressing 01/03/2022 2211 by Hortencia Pilar, RN Outcome: Progressing   Problem: Activity: Goal: Risk for activity intolerance will  decrease 01/03/2022 2213 by Hortencia Pilar, RN Outcome: Progressing 01/03/2022 2211 by Hortencia Pilar, RN Outcome: Progressing   Problem: Nutrition: Goal: Adequate nutrition will be maintained 01/03/2022 2213 by Hortencia Pilar, RN Outcome: Progressing 01/03/2022 2211 by Hortencia Pilar, RN Outcome: Progressing  Problem: Coping: Goal: Level of anxiety will decrease 01/03/2022 2213 by Hortencia Pilar, RN Outcome: Progressing 01/03/2022 2211 by Hortencia Pilar, RN Outcome: Progressing   Problem: Elimination: Goal: Will not experience complications related to bowel motility 01/03/2022 2213 by Hortencia Pilar, RN Outcome: Progressing 01/03/2022 2211 by Hortencia Pilar, RN Outcome: Progressing Goal: Will not experience complications related to urinary retention 01/03/2022 2213 by Hortencia Pilar, RN Outcome: Progressing 01/03/2022 2211 by Hortencia Pilar, RN Outcome: Progressing   Problem: Pain Managment: Goal: General experience of comfort will improve 01/03/2022 2213 by Hortencia Pilar, RN Outcome: Progressing 01/03/2022 2211 by Hortencia Pilar, RN Outcome: Progressing   Problem: Safety: Goal: Ability to remain free from injury will improve 01/03/2022 2213 by Hortencia Pilar, RN Outcome: Progressing 01/03/2022 2211 by Hortencia Pilar, RN Outcome: Progressing   Problem: Skin Integrity: Goal: Risk for impaired skin integrity will decrease 01/03/2022 2213 by Hortencia Pilar, RN Outcome: Progressing 01/03/2022 2211 by Hortencia Pilar, RN Outcome: Progressing

## 2022-01-04 LAB — ACTH STIMULATION, 3 TIME POINTS
Cortisol, 30 Min: 13.4 ug/dL
Cortisol, 60 Min: 17.1 ug/dL
Cortisol, Base: 3.7 ug/dL

## 2022-01-04 MED ORDER — ASPIRIN-ACETAMINOPHEN-CAFFEINE 250-250-65 MG PO TABS: 1.0000 | ORAL_TABLET | Freq: Three times a day (TID) | ORAL | Status: DC | PRN

## 2022-01-04 MED ORDER — ONDANSETRON HCL 4 MG PO TABS: 4.0000 mg | ORAL_TABLET | Freq: Three times a day (TID) | ORAL | 0 refills | Status: DC | PRN

## 2022-01-04 MED ORDER — PANTOPRAZOLE SODIUM 40 MG PO TBEC: 40.0000 mg | DELAYED_RELEASE_TABLET | Freq: Every day | ORAL | 0 refills | Status: DC

## 2022-01-04 MED ORDER — DAPSONE 100 MG PO TABS: 100.0000 mg | ORAL_TABLET | Freq: Every day | ORAL | 1 refills | Status: DC

## 2022-01-04 MED ORDER — GUAIFENESIN ER 600 MG PO TB12: 600.0000 mg | ORAL_TABLET | Freq: Two times a day (BID) | ORAL | 0 refills | Status: DC

## 2022-01-04 NOTE — Discharge Summary (Signed)
Physician Discharge Summary   Patient: Frances Ball MRN: [id number] DOB: 08-Dec-1990  Admit date:     12/30/2021  Discharge date: 01/04/22  Discharge Physician: Alba Cory   PCP: Blanchard Kelch, NP   Recommendations at discharge:    Needs referral to Endocrinologist Needs follow up with PCP.  Needs to follow up with Hematology for further evaluation of factor v Leiden   Discharge Diagnoses: Principal Problem:   Nausea and vomiting Active Problems:   Bronchiectasis (HCC)   Rhinovirus/enterovirus   Pancytopenia (HCC)   SIRS (systemic inflammatory response syndrome) (HCC)   Hypoglycemia   Chest pain   Hyponatremia   Hypomagnesemia   Abnormal urinalysis   HIV (human immunodeficiency virus infection) (HCC)   Factor 5 Leiden mutation, heterozygous (HCC)   History of opioid abuse (HCC)   Intractable nausea and vomiting  Resolved Problems:   * No resolved hospital problems. *  Hospital Course: 31 year old with past medical history significant for HIV on HARRT , bronchiectasis, anemia, hepatitis C status posttreatment, protein caloric malnutrition presents complaining of nausea and vomiting.  Unable to tolerate oral intake.  Reports urine was neon yellow, reports some suprapubic abdominal pain.  She reports fever up to 103. Evaluation in the ED: Chest x-ray improved lower lobe [place] with no acute process.  CT scan of the abdomen and pelvis: Showed focal hypoperfusion at the left kidney indicative of possible nephritis, left small ovarian cyst, postinfectious cystic area on the right kidney.   Respiratory panel positive for rhinovirus enterovirus.    Assessment and Plan:  1-Nausea vomiting: Suspect related to acute viral illness Report care, continue with Zofran as needed, start IV fluids. -Lipase 33.  -IV fluids -Required nausea to tolerate diet.  -Cortisol peak to 17, lab here use most specific assay. Adrenal insufficiency was rule out. I would recommend still  follow up with endocrinologist.    2-Rhinovirus, history of bronchiectasis: Supportive care. ID recommend to discontinue antibiotics.    3-Supra-pubic pain;  Abnormal CT scan showed hypoperfusion in the upper pole of the left kidney which could be indicative of pyelonephritis. Urine culture growing 30 K colonies, gram negative rods Received 2 doses of IV ceftriaxone. Discontinue by ID>    4-History of palpitation;  History of Near syncope.  ECHO Normal.  ACTH test : cortisol peak to 17 , ruled out adrenal insufficiency.    5-HVI; She is compliant with her meds.  Continue with HARRT.  CD4 75, ID ordering dapsone  Will provide prescription for Dapsone.   6-Loose Stool; Started florastore.  Hypomagnesemia;Replaced IV  History of opioid Use,  Chronic Suboxon.  Pancytopenia, SIRS; in setting of viral illness.  Hyponatremia; continue with IV fluids.    Factor V Leiden Mutation;  Needs to follow up with Hematology.  Hypoglycemia; due to poor oral inatke. Continue with IV fluids.  Chest pain;  Troponin negative.  No hypoxemia.  Suspect related to bronchitis, cough  D dimer elevated, CTA negative for PE, showed PNA. Doppler negative.  Headache, migraines; she takes Excedrin PRN at home.           Consultants: ID Procedures performed: None Disposition: Home Diet recommendation:  Discharge Diet Orders (From admission, onward)     Start     Ordered   01/04/22 0000  Diet - low sodium heart healthy        01/04/22 1301           Regular diet DISCHARGE MEDICATION: Allergies as of 01/04/2022  Reactions   Levaquin [levofloxacin In D5w] Shortness Of Breath, Rash   Zithromax [azithromycin] Shortness Of Breath, Swelling, Other (See Comments)   Pt reports tightness in skin with redness   Bactrim [sulfamethoxazole-trimethoprim] Swelling, Other (See Comments)   Pt reports that she was placed on Bactrim for pneumonia when she was d/c from [facility] and  developed a red face with swelling and can not tolerate Bactrim.    Sulfa Antibiotics Swelling   Facial swelling and burning   Vancomycin Other (See Comments)   Intolerance, pt states that as long as med is administered with Benadryl its tolarable   Darunavir Rash        Medication List     TAKE these medications    albuterol 108 (90 Base) MCG/ACT inhaler Commonly known as: VENTOLIN HFA Inhale 2 puffs into the lungs every 6 (six) hours as needed for wheezing or shortness of breath.   Biktarvy 50-200-25 MG Tabs tablet Generic drug: bictegravir-emtricitabine-tenofovir AF Take 1 tablet by mouth daily.   dapsone 100 MG tablet Take 1 tablet (100 mg total) by mouth daily. Start taking on: January 05, 2022   guaiFENesin 600 MG 12 hr tablet Commonly known as: MUCINEX Take 1 tablet (600 mg total) by mouth 2 (two) times daily.   ondansetron 4 MG tablet Commonly known as: Zofran Take 1 tablet (4 mg total) by mouth every 8 (eight) hours as needed for nausea or vomiting.   pantoprazole 40 MG tablet Commonly known as: PROTONIX Take 1 tablet (40 mg total) by mouth daily. Start taking on: January 05, 2022   Suboxone 8-2 MG Film Generic drug: Buprenorphine HCl-Naloxone HCl Place 1 Film under the tongue in the morning and at bedtime.        Discharge Exam: Filed Weights   12/30/21 1038  Weight: 52.2 kg   General; NAD  Condition at discharge: stable  The results of significant diagnostics from this hospitalization (including imaging, microbiology, ancillary and laboratory) are listed below for reference.   Imaging Studies: VAS [place] LOWER EXTREMITY VENOUS (DVT)  Result Date: 01/03/2022  Lower Venous DVT Study Patient Name:  Frances Ball  Date of Exam:   01/03/2022 Medical Rec #: [id number]        Accession #:    [id number] Date of Birth: Sep 07, 1990         Patient Gender: F Patient Age:   89 years Exam Location:  [facility] Procedure:      VAS [place] LOWER EXTREMITY VENOUS  (DVT) Referring Phys: Jon Billings Lynde Ludwig --------------------------------------------------------------------------------  Indications: Elevated D-Dimer, Factor V Leiden.  Comparison Study: No prior study on file Performing Technologist: Sherren Kerns RVS  Examination Guidelines: A complete evaluation includes B-mode imaging, spectral Doppler, color Doppler, and power Doppler as needed of all accessible portions of each vessel. Bilateral testing is considered an integral part of a complete examination. Limited examinations for reoccurring indications may be performed as noted. The reflux portion of the exam is performed with the patient in reverse Trendelenburg.  +--------+---------------+---------+-----------+----------------+-------------+ RIGHT   CompressibilityPhasicitySpontaneityProperties      Thrombus                                                                 Aging         +--------+---------------+---------+-----------+----------------+-------------+  CFV     Full                               pulsatile                                                                waveforms                     +--------+---------------+---------+-----------+----------------+-------------+ SFJ     Full                                                             +--------+---------------+---------+-----------+----------------+-------------+ FV Prox Full                                                             +--------+---------------+---------+-----------+----------------+-------------+ FV Mid  Full                                                             +--------+---------------+---------+-----------+----------------+-------------+ FV      Full                                                             Distal                                                                   +--------+---------------+---------+-----------+----------------+-------------+  PFV     Full                                                             +--------+---------------+---------+-----------+----------------+-------------+ POP     Full                               pulsatile  waveforms                     +--------+---------------+---------+-----------+----------------+-------------+ PTV     Full                                                             +--------+---------------+---------+-----------+----------------+-------------+ PERO    Full                                                             +--------+---------------+---------+-----------+----------------+-------------+   +--------+---------------+---------+-----------+----------------+-------------+ LEFT    CompressibilityPhasicitySpontaneityProperties      Thrombus                                                                 Aging         +--------+---------------+---------+-----------+----------------+-------------+ CFV     Full                               pulsatile                                                                waveforms                     +--------+---------------+---------+-----------+----------------+-------------+ SFJ     Full                                                             +--------+---------------+---------+-----------+----------------+-------------+ FV Prox Full                                                             +--------+---------------+---------+-----------+----------------+-------------+ FV Mid  Full                                                             +--------+---------------+---------+-----------+----------------+-------------+ FV      Full  Distal                                                                    +--------+---------------+---------+-----------+----------------+-------------+ PFV     Full                                                             +--------+---------------+---------+-----------+----------------+-------------+ POP     Full                               pulsatile                                                                waveforms                     +--------+---------------+---------+-----------+----------------+-------------+ PTV     Full                                                             +--------+---------------+---------+-----------+----------------+-------------+ PERO    Full                                                             +--------+---------------+---------+-----------+----------------+-------------+     Summary: BILATERAL: - No evidence of deep vein thrombosis seen in the lower extremities, bilaterally. -No evidence of popliteal cyst, bilaterally. RIGHT: pulsatile waveforms  LEFT: Pulsatile waveforms.  *See table(s) above for measurements and observations.    Preliminary    CT Angio Chest Pulmonary Embolism (PE) W or WO Contrast  Result Date: 01/02/2022 CLINICAL DATA:  High probability for PE. EXAM: CT ANGIOGRAPHY CHEST WITH CONTRAST TECHNIQUE: Multidetector CT imaging of the chest was performed using the standard protocol during bolus administration of intravenous contrast. Multiplanar CT image reconstructions and MIPs were obtained to evaluate the vascular anatomy. RADIATION DOSE REDUCTION: This exam was performed according to the departmental dose-optimization program which includes automated exposure control, adjustment of the mA and/or kV according to patient size and/or use of iterative reconstruction technique. CONTRAST:  66mL OMNIPAQUE IOHEXOL 350 MG/ML SOLN COMPARISON:  CT chest abdomen and pelvis 06/25/2021. CT chest 11/30/2018 FINDINGS: Cardiovascular: Satisfactory opacification of the pulmonary arteries to the  segmental level. No evidence of pulmonary embolism. Normal heart size. No pericardial effusion. Mediastinum/Nodes: No enlarged mediastinal, hilar, or axillary lymph nodes. Thyroid gland, trachea, and esophagus demonstrate no significant findings. Lungs/Pleura: There is some mild patchy airspace and  ground-glass opacity in the left lower lobe. 6 mm ground-glass nodule in the right upper lobe image 7/27 is stable. No new pulmonary nodules, pleural effusion or pneumothorax. Upper Abdomen: No acute abnormality. Musculoskeletal: No chest wall abnormality. No acute or significant osseous findings. Review of the MIP images confirms the above findings. IMPRESSION: 1. No evidence for pulmonary embolism. 2. Mild patchy airspace and ground-glass opacity in the left lower lobe worrisome for pneumonia. Recommend follow-up imaging to confirm complete resolution. 3. Stable 6 mm ground-glass nodule in the right upper lobe. Given stability over 3 years this is favored as benign. Electronically Signed   By: Darliss Cheney M.D.   On: 01/02/2022 18:23   ECHOCARDIOGRAM COMPLETE  Result Date: 01/02/2022    ECHOCARDIOGRAM REPORT   Patient Name:   Frances Ball Date of Exam: 01/02/2022 Medical Rec #:  [id number]       Height:       62.0 in Accession #:    [id number]      Weight:       115.0 lb Date of Birth:  1990/12/05        BSA:          1.511 m Patient Age:    31 years        BP:           104/80 mmHg Patient Gender: F               HR:           93 bpm. Exam Location:  Inpatient Procedure: 2D Echo, Cardiac Doppler and Color Doppler Indications:    Abnormal ECG R94.31  History:        Patient has no prior history of Echocardiogram examinations.                 Signs/Symptoms:Chest Pain.  Sonographer:    Lucendia Herrlich Referring Phys: [id number] Julion Gatt A Adriel Kessen  Sonographer Comments: Technically difficult study due to poor echo windows and suboptimal parasternal window. IMPRESSIONS  1. Left ventricular ejection fraction, by estimation,  is 60 to 65%. The left ventricle has normal function. The left ventricle has no regional wall motion abnormalities. Left ventricular diastolic parameters were normal.  2. Right ventricular systolic function is normal. The right ventricular size is normal.  3. The mitral valve is normal in structure. No evidence of mitral valve regurgitation. No evidence of mitral stenosis.  4. The aortic valve was not well visualized. Aortic valve regurgitation is not visualized. No aortic stenosis is present.  5. The inferior vena cava is normal in size with greater than 50% respiratory variability, suggesting right atrial pressure of 3 mmHg. FINDINGS  Left Ventricle: Left ventricular ejection fraction, by estimation, is 60 to 65%. The left ventricle has normal function. The left ventricle has no regional wall motion abnormalities. The left ventricular internal cavity size was normal in size. There is  no left ventricular hypertrophy. Left ventricular diastolic parameters were normal. Right Ventricle: The right ventricular size is normal. Right ventricular systolic function is normal. Left Atrium: Left atrial size was normal in size. Right Atrium: Right atrial size was normal in size. Pericardium: There is no evidence of pericardial effusion. Mitral Valve: The mitral valve is normal in structure. No evidence of mitral valve regurgitation. No evidence of mitral valve stenosis. Tricuspid Valve: The tricuspid valve is normal in structure. Tricuspid valve regurgitation is trivial. No evidence of tricuspid stenosis. Aortic Valve: The aortic valve was not well visualized. Aortic  valve regurgitation is not visualized. No aortic stenosis is present. Aortic valve mean gradient measures 2.0 mmHg. Aortic valve peak gradient measures 3.6 mmHg. Aortic valve area, by VTI measures 2.72 cm. Pulmonic Valve: The pulmonic valve was not well visualized. Pulmonic valve regurgitation is not visualized. No evidence of pulmonic stenosis. Aorta: The  aortic root is normal in size and structure. Venous: The inferior vena cava is normal in size with greater than 50% respiratory variability, suggesting right atrial pressure of 3 mmHg. IAS/Shunts: No atrial level shunt detected by color flow Doppler.  LEFT VENTRICLE PLAX 2D LVIDd:         3.90 cm   Diastology LVIDs:         2.50 cm   LV e' medial:    12.00 cm/s LV PW:         0.70 cm   LV E/e' medial:  7.2 LV IVS:        0.60 cm   LV e' lateral:   12.60 cm/s LVOT diam:     1.90 cm   LV E/e' lateral: 6.9 LV SV:         47 LV SV Index:   31 LVOT Area:     2.84 cm  RIGHT VENTRICLE             IVC RV S prime:     10.70 cm/s  IVC diam: 1.00 cm TAPSE (M-mode): 1.6 cm LEFT ATRIUM             Index        RIGHT ATRIUM          Index LA diam:        2.30 cm 1.52 cm/m   RA Area:     8.94 cm LA Vol (A2C):   27.7 ml 18.33 ml/m  RA Volume:   17.10 ml 11.32 ml/m LA Vol (A4C):   22.3 ml 14.76 ml/m LA Biplane Vol: 25.7 ml 17.01 ml/m  AORTIC VALVE AV Area (Vmax):    2.77 cm AV Area (Vmean):   2.72 cm AV Area (VTI):     2.72 cm AV Vmax:           94.70 cm/s AV Vmean:          65.200 cm/s AV VTI:            0.173 m AV Peak Grad:      3.6 mmHg AV Mean Grad:      2.0 mmHg LVOT Vmax:         92.50 cm/s LVOT Vmean:        62.600 cm/s LVOT VTI:          0.166 m LVOT/AV VTI ratio: 0.96  AORTA Ao Root diam: 3.00 cm MITRAL VALVE MV Area (PHT): 3.91 cm    SHUNTS MV Decel Time: 194 msec    Systemic VTI:  0.17 m MV E velocity: 87.00 cm/s  Systemic Diam: 1.90 cm MV A velocity: 49.30 cm/s MV E/A ratio:  1.76 Olga Millers MD Electronically signed by Olga Millers MD Signature Date/Time: 01/02/2022/1:46:00 PM    Final    CT Abdomen Pelvis W Contrast  Result Date: 12/31/2021 CLINICAL DATA:  31 year old female with history of acute onset of nonlocalized abdominal pain. Nausea, vomiting and diarrhea. Decreased appetite. EXAM: CT ABDOMEN AND PELVIS WITH CONTRAST TECHNIQUE: Multidetector CT imaging of the abdomen and pelvis was  performed using the standard protocol following bolus administration of intravenous contrast. RADIATION DOSE REDUCTION: This  exam was performed according to the departmental dose-optimization program which includes automated exposure control, adjustment of the mA and/or kV according to patient size and/or use of iterative reconstruction technique. CONTRAST:  75mL OMNIPAQUE IOHEXOL 350 MG/ML SOLN COMPARISON:  CT of the abdomen and pelvis 06/25/2021. FINDINGS: Lower chest: Areas of cylindrical and varicose bronchiectasis are noted in the left lower lobe, and to a lesser extent in the right middle lobe where there is also extensive thickening of the peribronchovascular interstitium and regional architectural distortion and chronic volume loss. Hepatobiliary: No suspicious cystic or solid hepatic lesions. No intra or extrahepatic biliary ductal dilatation. Gallbladder is normal in appearance. Pancreas: No pancreatic mass. No pancreatic ductal dilatation. No pancreatic or peripancreatic fluid collections or inflammatory changes. Spleen: Unremarkable. Adrenals/Urinary Tract: Multifocal cortical thinning in the right kidney, most severe posteriorly and in the upper pole, presumably areas of scarring from prior renal infection or infarctions. Left kidney is generally normal in appearance, although there is focal hypoperfusion in the upper pole which somewhat wedge-shaped in appearance. No aggressive appearing renal lesions. No hydroureteronephrosis. Urinary bladder is unremarkable in appearance. Bilateral adrenal glands are normal in appearance. Stomach/Bowel: The appearance of the stomach is normal. There is no pathologic dilatation of small bowel or colon. Status post appendectomy. Vascular/Lymphatic: No significant atherosclerotic disease, aneurysm or dissection noted in the abdominal or pelvic vasculature. No lymphadenopathy noted in the abdomen or pelvis. Reproductive: Uterus and right ovary are unremarkable in  appearance. Low-attenuation lesions in the left adnexa measure up to 3.3 x 2.6 cm, presumably a combination of follicles and small cysts. Other: No significant volume of ascites.  No pneumoperitoneum. Musculoskeletal: There are no aggressive appearing lytic or blastic lesions noted in the visualized portions of the skeleton. IMPRESSION: 1. Focal hypoperfusion in the upper pole of the left kidney, which could be indicative of pyelonephritis. Correlation with urinalysis is recommended. 2. 3.3 x 2.6 cm low-attenuation lesion in the left adnexa, presumably a small ovarian cyst. No imaging follow-up is recommended. 3. Extensive post infectious scarring, volume loss and bronchiectasis in the lung bases, similar to prior studies. 4. 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 Electronically Signed   By: Trudie Reedaniel  Entrikin M.D.   On: 12/31/2021 05:19   DG Chest 2 View  Result Date: 12/30/2021 CLINICAL DATA:  Body aches with vomiting.  Pain under left breast. EXAM: CHEST - 2 VIEW COMPARISON:  Radiographs 12/28/2021 and 06/25/2021.  CT 06/25/2021. FINDINGS: The heart size and mediastinal contours are stable. The lungs are mildly hyperinflated. There is chronic left lower lobe scarring which has mildly improved. No superimposed airspace disease, edema, pleural effusion or pneumothorax. The bones appear unremarkable. IMPRESSION: Improving left lower lobe scarring. No acute cardiopulmonary process. Electronically Signed   By: Carey BullocksWilliam  Veazey M.D.   On: 12/30/2021 11:35    Microbiology: Results for orders placed or performed during the hospital encounter of 12/30/21  Resp Panel by RT-PCR (Flu A&B, Covid) Anterior Nasal Swab     Status: None   Collection Time: 12/30/21 11:30 PM   Specimen: Anterior Nasal Swab  Result Value Ref Range Status   SARS Coronavirus 2 by RT PCR NEGATIVE NEGATIVE Final    Comment: (NOTE) SARS-CoV-2 target nucleic acids are NOT DETECTED.  The  SARS-CoV-2 RNA is generally detectable in upper respiratory specimens during the acute phase of infection. The lowest concentration of SARS-CoV-2 viral copies this assay can detect is 138 copies/mL. A negative result does not preclude SARS-Cov-2  infection and should not be used as the sole basis for treatment or other patient management decisions. A negative result may occur with  improper specimen collection/handling, submission of specimen other than nasopharyngeal swab, presence of viral mutation(s) within the areas targeted by this assay, and inadequate number of viral copies(<138 copies/mL). A negative result must be combined with clinical observations, patient history, and epidemiological information. The expected result is Negative.  Fact Sheet for Patients:  BloggerCourse.com  Fact Sheet for Healthcare Providers:  SeriousBroker.it  This test is no t yet approved or cleared by the [place] FDA and  has been authorized for detection and/or diagnosis of SARS-CoV-2 by FDA under an Emergency Use Authorization (EUA). This EUA will remain  in effect (meaning this test can be used) for the duration of the COVID-19 declaration under Section 564(b)(1) of the Act, 21 U.S.C.section 360bbb-3(b)(1), unless the authorization is terminated  or revoked sooner.       Influenza A by PCR NEGATIVE NEGATIVE Final   Influenza B by PCR NEGATIVE NEGATIVE Final    Comment: (NOTE) The Xpert Xpress SARS-CoV-2/FLU/RSV plus assay is intended as an aid in the diagnosis of influenza from Nasopharyngeal swab specimens and should not be used as a sole basis for treatment. Nasal washings and aspirates are unacceptable for Xpert Xpress SARS-CoV-2/FLU/RSV testing.  Fact Sheet for Patients: BloggerCourse.com  Fact Sheet for Healthcare Providers: SeriousBroker.it  This test is not yet approved or  cleared by the [place] FDA and has been authorized for detection and/or diagnosis of SARS-CoV-2 by FDA under an Emergency Use Authorization (EUA). This EUA will remain in effect (meaning this test can be used) for the duration of the COVID-19 declaration under Section 564(b)(1) of the Act, 21 U.S.C. section 360bbb-3(b)(1), unless the authorization is terminated or revoked.  Performed at [facility], [address] [place], [place] [address]   Respiratory (~20 pathogens) panel by PCR     Status: Abnormal   Collection Time: 12/30/21 11:30 PM   Specimen: Anterior Nasal Swab; Respiratory  Result Value Ref Range Status   Adenovirus NOT DETECTED NOT DETECTED Final   Coronavirus 229E NOT DETECTED NOT DETECTED Final    Comment: (NOTE) The Coronavirus on the Respiratory Panel, DOES NOT test for the novel  Coronavirus (2019 nCoV)    Coronavirus HKU1 NOT DETECTED NOT DETECTED Final   Coronavirus NL63 NOT DETECTED NOT DETECTED Final   Coronavirus OC43 NOT DETECTED NOT DETECTED Final   Metapneumovirus NOT DETECTED NOT DETECTED Final   Rhinovirus / Enterovirus DETECTED (A) NOT DETECTED Final   Influenza A NOT DETECTED NOT DETECTED Final   Influenza B NOT DETECTED NOT DETECTED Final   Parainfluenza Virus 1 NOT DETECTED NOT DETECTED Final   Parainfluenza Virus 2 NOT DETECTED NOT DETECTED Final   Parainfluenza Virus 3 NOT DETECTED NOT DETECTED Final   Parainfluenza Virus 4 NOT DETECTED NOT DETECTED Final   Respiratory Syncytial Virus NOT DETECTED NOT DETECTED Final   Bordetella pertussis NOT DETECTED NOT DETECTED Final   Bordetella Parapertussis NOT DETECTED NOT DETECTED Final   Chlamydophila pneumoniae NOT DETECTED NOT DETECTED Final   Mycoplasma pneumoniae NOT DETECTED NOT DETECTED Final    Comment: Performed at [facility], [address], [place], [place] [address]  Urine Culture     Status: Abnormal   Collection Time: 12/31/21  7:31 AM   Specimen: Urine, Clean Catch   Result Value Ref Range Status   Specimen Description URINE, CLEAN CATCH  Final   Special Requests   Final    NONE Performed at [facility], [address] [place], [place] [address]    Culture 30,000 COLONIES/mL ESCHERICHIA COLI (A)  Final   Report Status 01/02/2022 FINAL  Final   Organism ID, Bacteria ESCHERICHIA COLI (A)  Final      Susceptibility   Escherichia coli - MIC*    AMPICILLIN >=32 RESISTANT Resistant     CEFAZOLIN 16 SENSITIVE Sensitive     CEFEPIME <=0.12 SENSITIVE Sensitive     CEFTRIAXONE <=0.25 SENSITIVE Sensitive     CIPROFLOXACIN >=4 RESISTANT Resistant     GENTAMICIN <=1 SENSITIVE Sensitive     IMIPENEM <=0.25 SENSITIVE Sensitive     NITROFURANTOIN <=16 SENSITIVE Sensitive     TRIMETH/SULFA <=20 SENSITIVE Sensitive     AMPICILLIN/SULBACTAM >=32 RESISTANT Resistant     PIP/TAZO 64 INTERMEDIATE Intermediate     * 30,000 COLONIES/mL ESCHERICHIA COLI    Labs: CBC: Recent Labs  Lab 12/30/21 2330 12/31/21 0216 12/31/21 0709 01/01/22 0815 01/02/22 0319  WBC 4.7  --  2.2* 3.3* 2.3*  NEUTROABS 2.9  --  1.1*  --   --   HGB 13.5 13.3 10.4* 10.6* 10.6*  HCT 44.7 39.0 33.2* 32.9* 33.7*  MCV 80.0  --  78.7* 76.3* 76.2*  PLT 244  --  233 219 191   Basic Metabolic Panel: Recent Labs  Lab 12/31/21 0216 12/31/21 0709 01/01/22 0519 01/02/22 0319  NA 136 132* 138 140  K 4.3 3.5 4.0 3.5  CL 105 102 106 101  CO2  --  20* 21* 26  GLUCOSE 70 69* 87 98  BUN 13 9 5* 6  CREATININE 0.80 1.12* 0.87 0.75  CALCIUM  --  7.9* 7.9* 8.2*  MG  --  1.5*  --  2.0   Liver Function Tests: Recent Labs  Lab 12/31/21 0709 01/01/22 0519  AST 16 19  ALT 8 9  ALKPHOS 35* 35*  BILITOT 0.8 0.4  PROT 5.7* 5.6*  ALBUMIN 2.8* 2.8*   CBG: Recent Labs  Lab 12/31/21 1204 12/31/21 1608 12/31/21 2216 01/01/22 0906  GLUCAP 72 66* 82 78    Discharge time spent: greater than 30 minutes.  Signed: Alba Cory, MD [facility] 01/04/2022

## 2022-01-04 NOTE — Progress Notes (Signed)
  Transition of Care [facility]) Screening Note   Patient Details  Name: Frances Ball Date of Birth: Jul 02, 1990   Transition of Care [facility]) CM/SW Contact:    Bess Kinds, RN Phone Number: [phone] 01/04/2022, 1:06 PM  Patient to transition home today. No TOC needs identified at this time.   Transition of Care Department [facility]) has reviewed patient and no TOC needs have been identified at this time. We will continue to monitor patient advancement through interdisciplinary progression rounds. If new patient transition needs arise, please place a TOC consult.

## 2022-01-16 ENCOUNTER — Telehealth: Payer: Self-pay

## 2022-01-16 NOTE — Telephone Encounter (Signed)
Spoke with mother, she is going to try to encourage Frances Ball to come in for an overdue visit and she is willing to be a part of her care. She will call our office back if [facility] decides to schedule. Staff gave her a few open available options with the next 2 weeks.

## 2022-01-16 NOTE — Telephone Encounter (Signed)
Would encourage her mother to see if she can get Frances Ball to let her join her for a visit in the office to be a part of any discussions about labs/treatment too.

## 2022-01-16 NOTE — Telephone Encounter (Signed)
Received call from patient's mother requesting to speak with S. Durwin Nora, NP regarding labs. Family would not give any specifics as to which labs she was inquiring about.  Routing to provider to make aware.  Valarie Cones, LPN

## 2022-01-20 MED ORDER — DOXYCYCLINE HYCLATE 100 MG PO TABS: 100.0000 mg | ORAL_TABLET | Freq: Two times a day (BID) | ORAL | 0 refills | Status: AC

## 2022-01-20 MED ORDER — DAPSONE 100 MG PO TABS: 100.0000 mg | ORAL_TABLET | Freq: Every day | ORAL | 5 refills | Status: DC

## 2022-01-20 NOTE — Addendum Note (Signed)
Addended by: Blanchard Kelch on: 01/20/2022 04:30 PM   Modules accepted: Orders

## 2022-03-11 ENCOUNTER — Encounter (INDEPENDENT_AMBULATORY_CARE_PROVIDER_SITE_OTHER): Payer: Medicaid Other

## 2022-03-11 DIAGNOSIS — J101 Influenza due to other identified influenza virus with other respiratory manifestations: Secondary | ICD-10-CM | POA: Diagnosis not present

## 2022-03-11 DIAGNOSIS — B20 Human immunodeficiency virus [HIV] disease: Secondary | ICD-10-CM | POA: Diagnosis not present

## 2022-03-11 MED ORDER — ONDANSETRON HCL 4 MG PO TABS: 48.0000 mg | ORAL_TABLET | Freq: Three times a day (TID) | ORAL | 0 refills | Status: DC | PRN

## 2022-03-11 MED ORDER — OSELTAMIVIR PHOSPHATE 75 MG PO CAPS: 75.0000 mg | ORAL_CAPSULE | Freq: Two times a day (BID) | ORAL | 0 refills | Status: DC

## 2022-03-11 NOTE — Telephone Encounter (Signed)
Please see the MyChart message reply(ies) for my assessment and plan.    This patient gave consent for this Medical Advice Message and is aware that it may result in a bill to [employer], as well as the possibility of receiving a bill for a co-payment or deductible. They are an established patient, but are not seeking medical advice exclusively about a problem treated during an in person or video visit in the last seven days. I did not recommend an in person or video visit within seven days of my reply.    I spent a total of 10 minutes cumulative time within 7 days through [employer].  Problem addressed:  Acute influenzae infection Nausea  AIDS  Janene Madeira, NP

## 2022-03-24 ENCOUNTER — Ambulatory Visit: Payer: Medicaid Other | Attending: Cardiology | Admitting: Cardiology

## 2022-04-02 ENCOUNTER — Encounter: Payer: Self-pay | Admitting: Cardiology

## 2022-05-25 DIAGNOSIS — B20 Human immunodeficiency virus [HIV] disease: Secondary | ICD-10-CM

## 2022-05-26 MED ORDER — BIKTARVY 50-200-25 MG PO TABS: 1.0000 | ORAL_TABLET | Freq: Every day | ORAL | 2 refills | Status: DC

## 2022-05-26 MED ORDER — ONDANSETRON HCL 4 MG PO TABS: 48.0000 mg | ORAL_TABLET | Freq: Three times a day (TID) | ORAL | 2 refills | Status: DC | PRN

## 2022-08-28 ENCOUNTER — Other Ambulatory Visit: Payer: Self-pay

## 2022-08-28 ENCOUNTER — Encounter: Payer: Self-pay | Admitting: Infectious Diseases

## 2022-08-28 ENCOUNTER — Other Ambulatory Visit: Payer: Self-pay | Admitting: Infectious Diseases

## 2022-08-28 ENCOUNTER — Ambulatory Visit (INDEPENDENT_AMBULATORY_CARE_PROVIDER_SITE_OTHER): Payer: Medicaid Other | Admitting: Infectious Diseases

## 2022-08-28 VITALS — BP 108/84 | HR 84 | Temp 98.0°F | Ht 62.0 in | Wt 119.0 lb

## 2022-08-28 DIAGNOSIS — R55 Syncope and collapse: Secondary | ICD-10-CM

## 2022-08-28 DIAGNOSIS — R42 Dizziness and giddiness: Secondary | ICD-10-CM

## 2022-08-28 DIAGNOSIS — R112 Nausea with vomiting, unspecified: Secondary | ICD-10-CM | POA: Diagnosis not present

## 2022-08-28 DIAGNOSIS — B20 Human immunodeficiency virus [HIV] disease: Secondary | ICD-10-CM | POA: Diagnosis present

## 2022-08-28 DIAGNOSIS — R002 Palpitations: Secondary | ICD-10-CM

## 2022-08-28 DIAGNOSIS — M419 Scoliosis, unspecified: Secondary | ICD-10-CM | POA: Insufficient documentation

## 2022-08-28 DIAGNOSIS — M4125 Other idiopathic scoliosis, thoracolumbar region: Secondary | ICD-10-CM

## 2022-08-28 LAB — CBC
HCT: 43.3 % (ref 35.0–45.0)
Hemoglobin: 13.5 g/dL (ref 11.7–15.5)
MCH: 25.1 pg — ABNORMAL LOW (ref 27.0–33.0)
MCHC: 31.2 g/dL — ABNORMAL LOW (ref 32.0–36.0)
MCV: 80.6 fL (ref 80.0–100.0)
MPV: 10.6 fL (ref 7.5–12.5)
Platelets: 247 10*3/uL (ref 140–400)
RBC: 5.37 10*6/uL — ABNORMAL HIGH (ref 3.80–5.10)
RDW: 17.1 % — ABNORMAL HIGH (ref 11.0–15.0)
WBC: 2.4 10*3/uL — ABNORMAL LOW (ref 3.8–10.8)

## 2022-08-28 MED ORDER — MIDODRINE HCL 2.5 MG PO TABS: 2.5000 mg | ORAL_TABLET | Freq: Three times a day (TID) | ORAL | 0 refills | Status: DC

## 2022-08-28 MED ORDER — BIKTARVY 50-200-25 MG PO TABS: 1.0000 | ORAL_TABLET | Freq: Every day | ORAL | 11 refills | Status: DC

## 2022-08-28 MED ORDER — ONDANSETRON HCL 4 MG PO TABS: 48.0000 mg | ORAL_TABLET | Freq: Three times a day (TID) | ORAL | 2 refills | Status: DC | PRN

## 2022-08-28 NOTE — Assessment & Plan Note (Signed)
Can continue zofran 4-8 mg ODT PRN

## 2022-08-28 NOTE — Assessment & Plan Note (Addendum)
Clinically looks like she has been taking her biktarvy since I last saw her. She has regained weight and looks good. Has not been hospitalized since November either when she was off her meds and sick with norovirus/enterovirus.  Continue biktarvy everyday. Update labs today if we can get them (tough tough stick)   She can continue to defer dapsone for now - if any concerns with CD4 will message her. Last was ~80 during hospital stay in November.   Referral to bridge counselor made today.

## 2022-08-28 NOTE — Assessment & Plan Note (Signed)
Given she describes these episodes triggered by movements I do think we can trial her on low dose midodrine TID to see if this helps with possible orthostatic hypotension as cause; I think this dose scheme will be hard for her to take but maybe if she has symptoms during a particular time of day more consistently she can see if it is helpful to lessen the episodes while we figure out what's going on. She can check her blood pressures at home as well. I don't think this will in any way risk hypertension. Cardiac evaluation was unrevealing for any arrhythmia during monitor period.  She has had a few things come up over the years in working with [name] where I have suspected endocrine cause - has had subclinical hyperthyroidism and abnormal / low cortisol's during acute illnesses inpatient. I have previously recommended formal endocrinology evaluation, but she has not gone to appointments.

## 2022-08-28 NOTE — Assessment & Plan Note (Signed)
Reviewed cardiology's notes and interpretation today with [name] - no electrical disturbance noted and no arrhythmias. All NSR with controlled rate during that study.
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
## 2022-08-28 NOTE — Patient Instructions (Signed)
Will try you on a medication called Midodrine - 1 tablet three times a day to see if this helps with the dizziness. I do worry your blood pressure drops causing your symptoms - called Orthostatic Hypotension   Continue your Biktarvy everyday  Referrals to be on the look out for:  [facility] - with you having had abnormal cortisol's in the past and the ongoing dizziness/rapid heart beats I want to make sure nothing is hormonally causing that.  Orthopedic Surgery - to evaluate for possible back brace for your scoliosis.   Would like to see you back in 6 months.

## 2022-08-31 ENCOUNTER — Other Ambulatory Visit: Payer: Self-pay

## 2022-08-31 DIAGNOSIS — R42 Dizziness and giddiness: Secondary | ICD-10-CM

## 2022-08-31 MED ORDER — DAPSONE 100 MG PO TABS: 100.0000 mg | ORAL_TABLET | Freq: Every day | ORAL | 5 refills | Status: DC

## 2022-08-31 MED ORDER — ONDANSETRON HCL 4 MG PO TABS
ORAL_TABLET | ORAL | 2 refills | Status: DC
Start: 2022-08-31 — End: 2022-11-30

## 2022-08-31 NOTE — Addendum Note (Signed)
Addended by: Blanchard Kelch on: 08/31/2022 04:33 PM   Modules accepted: Orders

## 2022-10-09 ENCOUNTER — Other Ambulatory Visit: Payer: Self-pay | Admitting: Infectious Diseases

## 2022-10-09 DIAGNOSIS — B20 Human immunodeficiency virus [HIV] disease: Secondary | ICD-10-CM

## 2022-11-11 IMAGING — CT CT CHEST-ABD-PELV W/ CM
2 of 4 series · 14 of 36 positions shown, 16 images · IV contrast (agent unspecified)
Comparison: Outside hospital ([REDACTED]) CTA chest dated
06/23/2021. CT abdomen/pelvis dated 09/28/2019.

CLINICAL DATA: Pneumonia, HIV, bronchiectasis

EXAM:
CT CHEST, ABDOMEN, AND PELVIS WITH CONTRAST
TECHNIQUE: Multidetector CT imaging of the chest, abdomen and pelvis was
performed following the standard protocol during bolus
administration of intravenous contrast.

[Series 3: cap with 5mm st · axial · 0.77mm/px · z∈[+740,+1250]mm · 11 of 124 slices shown, 13 images]
[im 11/124  mediastinal]
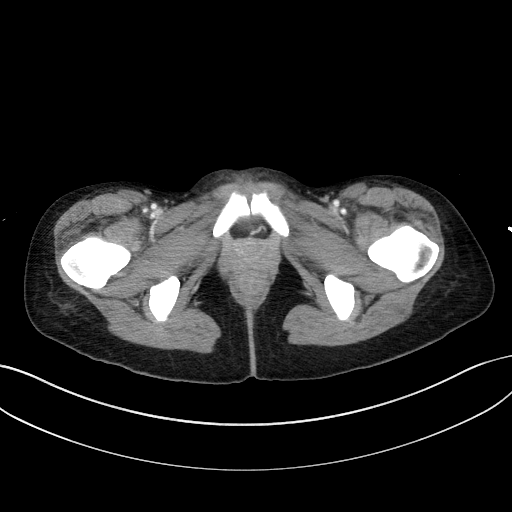
[im 11/124  bone]
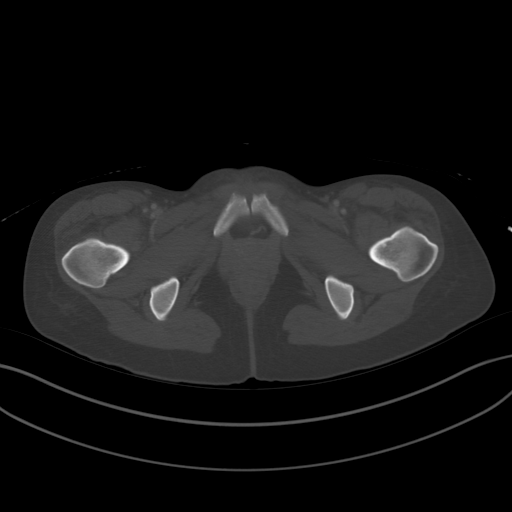
[im 21/124  mediastinal]
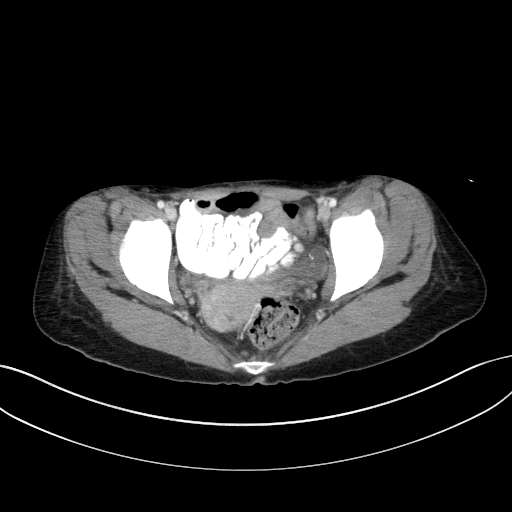
[im 31/124  mediastinal]
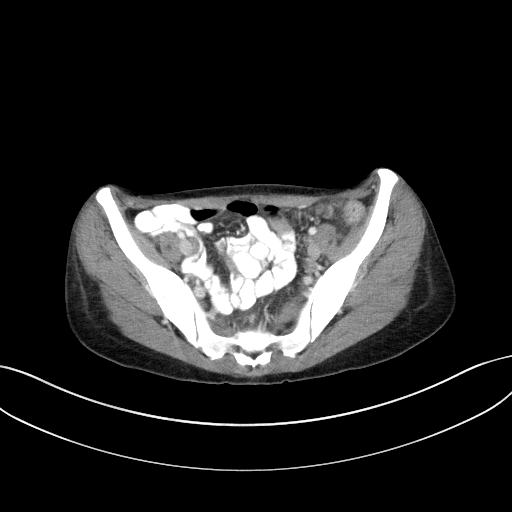
[im 42/124  mediastinal]
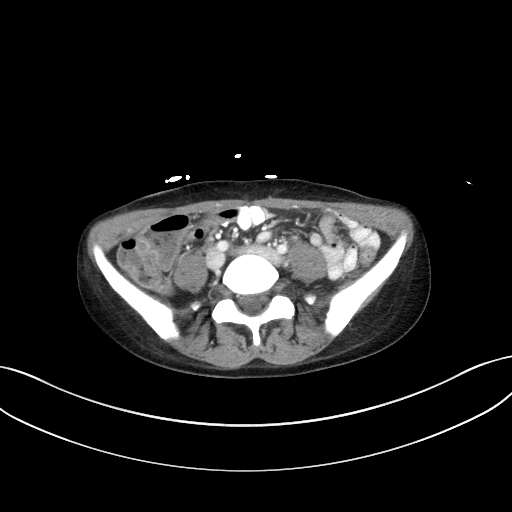
[im 52/124  mediastinal]
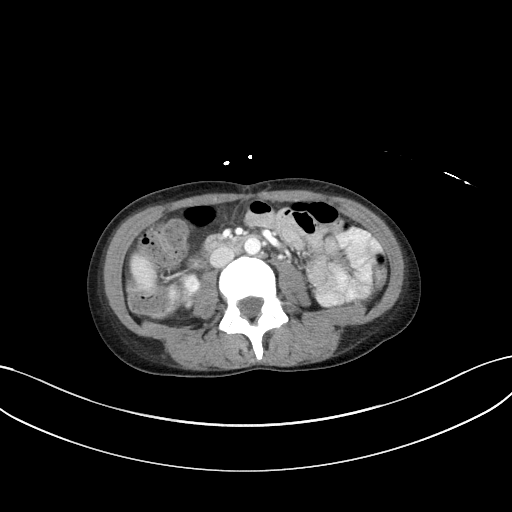
[im 62/124  mediastinal]
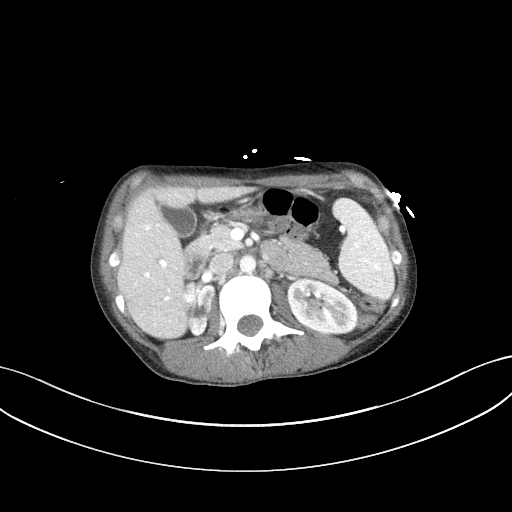
[im 72/124  mediastinal]
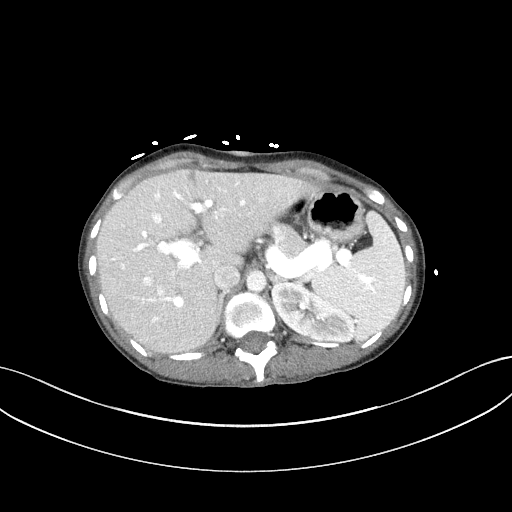
[im 83/124  mediastinal]
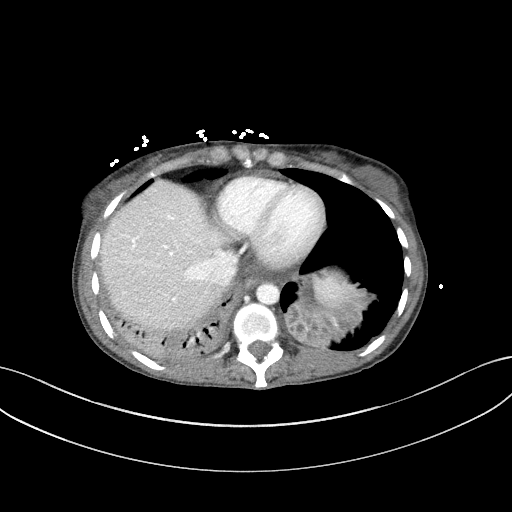
[im 93/124  mediastinal]
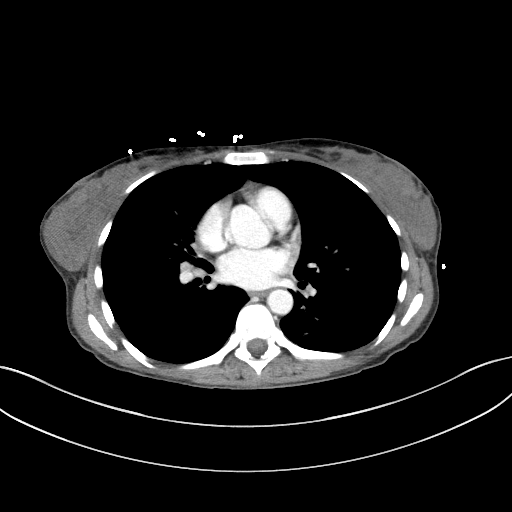
[im 93/124  bone]
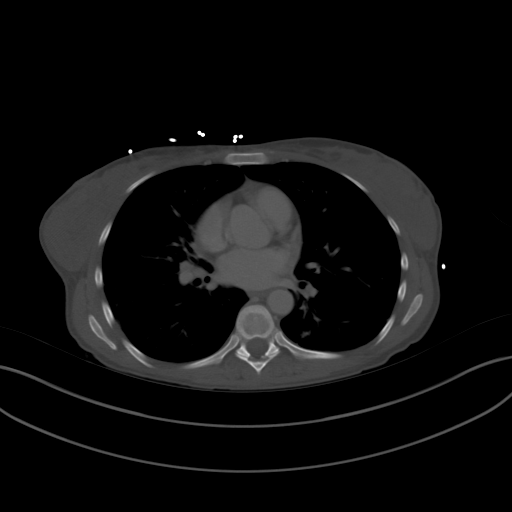
[im 103/124  mediastinal]
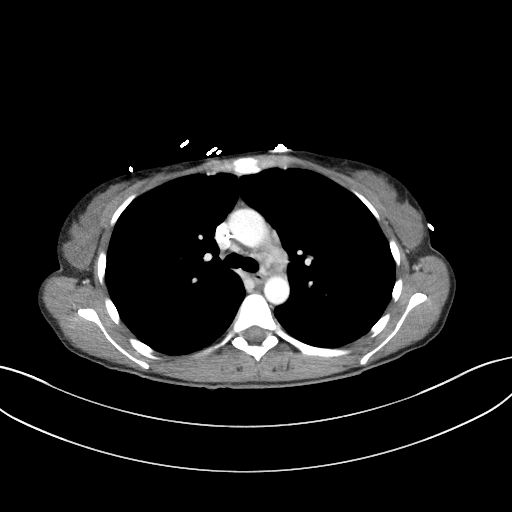
[im 113/124  mediastinal]
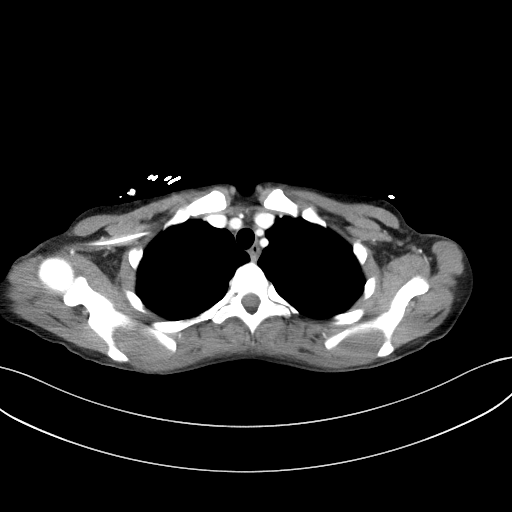

[Series 6: cap with 3mm st cor · coronal · 0.72mm/px · 3 of 105 slices shown]
[im 21/105  mediastinal]
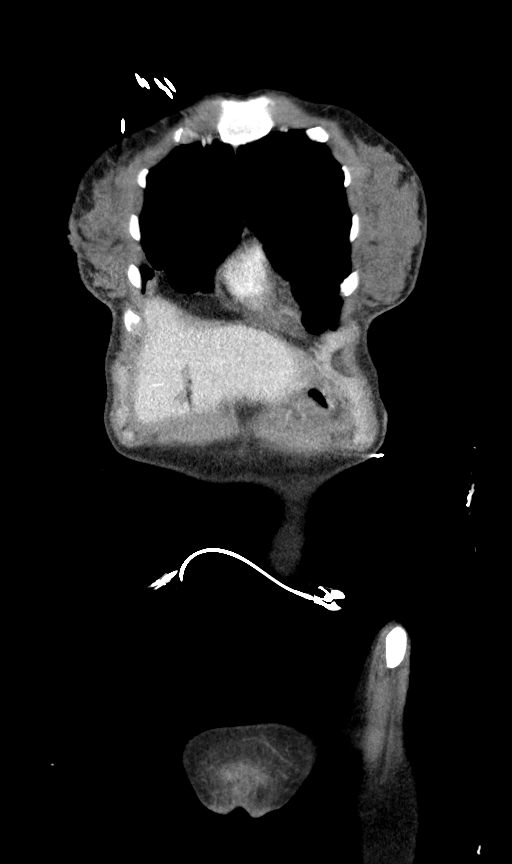
[im 42/105  mediastinal]
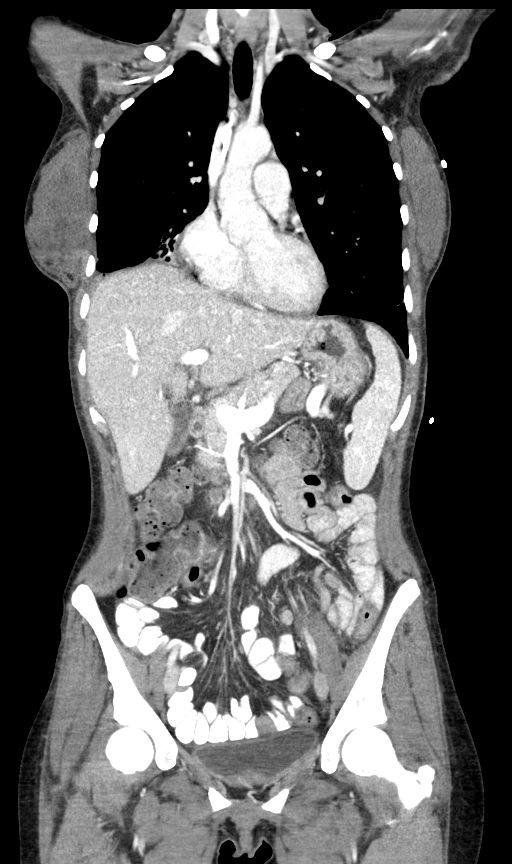
[im 63/105  mediastinal]
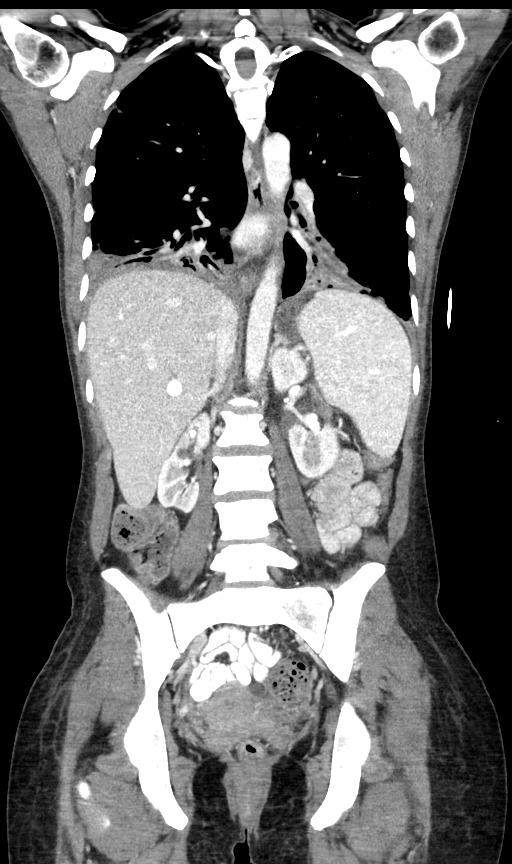

[14 of 36 positions shown; findings below may reference images not displayed]

RADIATION DOSE REDUCTION: This exam was performed according to the
departmental dose-optimization program which includes automated
exposure control, adjustment of the mA and/or kV according to
patient size and/or use of iterative reconstruction technique.

CONTRAST:  100mL OMNIPAQUE IOHEXOL 300 MG/ML  SOLN
FINDINGS: CT CHEST FINDINGS

Cardiovascular: Heart is normal in size.  No pericardial effusion.

No evidence of thoracic aortic aneurysm.

Right arm PICC terminates at the cavoatrial junction.

Mediastinum/Nodes: No suspicious mediastinal lymphadenopathy.

Visualized thyroid is unremarkable.

Lungs/Pleura: Right middle and bilateral lower lobe pneumonia, right
lower lobe predominant, with air bronchograms and bronchiectasis.
This is unchanged from recent CT.

No suspicious pulmonary nodules.

No pleural effusion or pneumothorax.

Musculoskeletal: Visualized osseous structures are within normal
limits.

CT ABDOMEN PELVIS FINDINGS

Hepatobiliary: Liver is within normal limits.

Gallbladder is unremarkable. No intrahepatic or extrahepatic ductal
dilatation.

Pancreas: Within normal limits.

Spleen: Within normal limits.

Adrenals/Urinary Tract: Adrenal glands are within normal limits.

Right upper pole cortical atrophy with two upper pole cysts (series
3/images 62 and 64). Left kidney is within normal limits. No
hydronephrosis.

Mildly thick-walled bladder.

Stomach/Bowel: Stomach is notable for a posterior gastric cardia
diverticulum (series 3/image 47).

No evidence of bowel obstruction.

Appendix is not discretely visualized.

No colonic wall thickening or inflammatory changes.

Vascular/Lymphatic: No evidence of abdominal aortic aneurysm.

No suspicious abdominopelvic lymphadenopathy.

Reproductive: Uterus is within normal limits.

Bilateral ovaries are within normal limits.

Other: No abdominopelvic ascites.

Musculoskeletal: Visualized osseous structures are within normal
limits.
IMPRESSION: Multifocal pneumonia, right lower lobe predominant, unchanged.

Mildly thick-walled bladder, correlate for cystitis.

Additional ancillary findings as above.

## 2022-11-30 ENCOUNTER — Other Ambulatory Visit: Payer: Self-pay

## 2022-11-30 DIAGNOSIS — R42 Dizziness and giddiness: Secondary | ICD-10-CM

## 2022-11-30 DIAGNOSIS — R55 Syncope and collapse: Secondary | ICD-10-CM

## 2022-11-30 MED ORDER — ONDANSETRON HCL 4 MG PO TABS: ORAL_TABLET | ORAL | 2 refills | Status: AC

## 2023-01-20 ENCOUNTER — Ambulatory Visit: Payer: Medicaid Other | Admitting: Endocrinology

## 2023-02-18 ENCOUNTER — Ambulatory Visit: Payer: Medicaid Other | Admitting: Infectious Diseases

## 2023-02-25 ENCOUNTER — Telehealth: Payer: Self-pay

## 2023-02-25 NOTE — Telephone Encounter (Signed)
Patient mail box is full, will try again later to reschedule missed appointment with [facility].

## 2023-03-17 ENCOUNTER — Ambulatory Visit: Payer: Medicaid Other | Admitting: Endocrinology

## 2023-05-06 ENCOUNTER — Encounter: Payer: Self-pay | Admitting: Endocrinology

## 2023-05-06 ENCOUNTER — Ambulatory Visit (INDEPENDENT_AMBULATORY_CARE_PROVIDER_SITE_OTHER): Payer: Medicaid Other | Admitting: Endocrinology

## 2023-05-06 VITALS — BP 94/60 | HR 80 | Resp 16 | Ht 62.0 in | Wt 122.2 lb

## 2023-05-06 DIAGNOSIS — R7989 Other specified abnormal findings of blood chemistry: Secondary | ICD-10-CM | POA: Diagnosis not present
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
## 2023-05-07 LAB — TSH: TSH: 5.72 m[IU]/L — ABNORMAL HIGH

## 2023-05-07 LAB — CORTISOL: Cortisol, Plasma: 3.2 ug/dL

## 2023-05-12 ENCOUNTER — Other Ambulatory Visit: Payer: Self-pay | Admitting: Endocrinology

## 2023-05-12 ENCOUNTER — Telehealth: Payer: Self-pay

## 2023-05-12 ENCOUNTER — Encounter: Payer: Self-pay | Admitting: Endocrinology

## 2023-05-12 DIAGNOSIS — R7989 Other specified abnormal findings of blood chemistry: Secondary | ICD-10-CM

## 2023-05-12 NOTE — Telephone Encounter (Signed)
 Patient given results  as directed by MD. No further questions at this time. Patient declined to be transferred to front desk to place lab appointment, stated she will call office back to schedule.

## 2023-05-12 NOTE — Telephone Encounter (Signed)
-----   Message from Frances Ball sent at 05/12/2023  2:17 PM EDT ----- Please notify patient of labs reviewed TSH is mildly elevated and cortisol in the low normal range.  I would like to repeat lab in the early morning fasting for a.m. cortisol, TSH and free T4 in 1 month. No plan for thyroid medication at this time.  We may have to complete additional test for cortisol before starting thyroid medication as well.  Nursing: For lab visit in the early morning fasting in 1 month.  Make follow-up appointment with me in 6 weeks.  I have placed lab orders.

## 2023-08-10 ENCOUNTER — Telehealth: Payer: Self-pay

## 2023-08-10 NOTE — Telephone Encounter (Signed)
 Patient voice mail box full, will try to call again to schedule appointment after request from patient was sent for Melvenia Krabbe

## 2023-08-30 ENCOUNTER — Emergency Department (HOSPITAL_COMMUNITY)

## 2023-08-30 ENCOUNTER — Inpatient Hospital Stay (HOSPITAL_COMMUNITY)
Admission: EM | Admit: 2023-08-30 | Discharge: 2023-09-03 | DRG: 975 | Disposition: A | Discharge status: Home / Self Care | Attending: Internal Medicine | Admitting: Internal Medicine

## 2023-08-30 ENCOUNTER — Other Ambulatory Visit: Payer: Self-pay

## 2023-08-30 ENCOUNTER — Encounter (HOSPITAL_COMMUNITY): Payer: Self-pay

## 2023-08-30 DIAGNOSIS — J159 Unspecified bacterial pneumonia: Secondary | ICD-10-CM | POA: Diagnosis present

## 2023-08-30 DIAGNOSIS — G894 Chronic pain syndrome: Secondary | ICD-10-CM | POA: Diagnosis present

## 2023-08-30 DIAGNOSIS — Z881 Allergy status to other antibiotic agents status: Secondary | ICD-10-CM

## 2023-08-30 DIAGNOSIS — D649 Anemia, unspecified: Secondary | ICD-10-CM

## 2023-08-30 DIAGNOSIS — Z91148 Patient's other noncompliance with medication regimen for other reason: Secondary | ICD-10-CM | POA: Diagnosis not present

## 2023-08-30 DIAGNOSIS — D6851 Activated protein C resistance: Secondary | ICD-10-CM | POA: Diagnosis present

## 2023-08-30 DIAGNOSIS — R627 Adult failure to thrive: Secondary | ICD-10-CM | POA: Diagnosis present

## 2023-08-30 DIAGNOSIS — Z833 Family history of diabetes mellitus: Secondary | ICD-10-CM

## 2023-08-30 DIAGNOSIS — Z8249 Family history of ischemic heart disease and other diseases of the circulatory system: Secondary | ICD-10-CM

## 2023-08-30 DIAGNOSIS — I9589 Other hypotension: Secondary | ICD-10-CM | POA: Diagnosis present

## 2023-08-30 DIAGNOSIS — R0602 Shortness of breath: Secondary | ICD-10-CM | POA: Diagnosis present

## 2023-08-30 DIAGNOSIS — J189 Pneumonia, unspecified organism: Secondary | ICD-10-CM | POA: Diagnosis not present

## 2023-08-30 DIAGNOSIS — F4310 Post-traumatic stress disorder, unspecified: Secondary | ICD-10-CM | POA: Diagnosis present

## 2023-08-30 DIAGNOSIS — E8729 Other acidosis: Secondary | ICD-10-CM | POA: Diagnosis present

## 2023-08-30 DIAGNOSIS — J470 Bronchiectasis with acute lower respiratory infection: Secondary | ICD-10-CM | POA: Diagnosis present

## 2023-08-30 DIAGNOSIS — B20 Human immunodeficiency virus [HIV] disease: Secondary | ICD-10-CM | POA: Diagnosis present

## 2023-08-30 DIAGNOSIS — Z5982 Transportation insecurity: Secondary | ICD-10-CM

## 2023-08-30 DIAGNOSIS — E872 Acidosis, unspecified: Secondary | ICD-10-CM | POA: Diagnosis present

## 2023-08-30 DIAGNOSIS — Z1152 Encounter for screening for COVID-19: Secondary | ICD-10-CM

## 2023-08-30 DIAGNOSIS — Z21 Asymptomatic human immunodeficiency virus [HIV] infection status: Secondary | ICD-10-CM | POA: Diagnosis present

## 2023-08-30 DIAGNOSIS — Z888 Allergy status to other drugs, medicaments and biological substances status: Secondary | ICD-10-CM

## 2023-08-30 DIAGNOSIS — Z882 Allergy status to sulfonamides status: Secondary | ICD-10-CM | POA: Diagnosis not present

## 2023-08-30 DIAGNOSIS — R519 Headache, unspecified: Secondary | ICD-10-CM | POA: Diagnosis not present

## 2023-08-30 DIAGNOSIS — E871 Hypo-osmolality and hyponatremia: Secondary | ICD-10-CM | POA: Diagnosis present

## 2023-08-30 DIAGNOSIS — D638 Anemia in other chronic diseases classified elsewhere: Secondary | ICD-10-CM | POA: Diagnosis present

## 2023-08-30 DIAGNOSIS — J479 Bronchiectasis, uncomplicated: Secondary | ICD-10-CM | POA: Diagnosis not present

## 2023-08-30 DIAGNOSIS — J181 Lobar pneumonia, unspecified organism: Secondary | ICD-10-CM | POA: Diagnosis not present

## 2023-08-30 LAB — COMPREHENSIVE METABOLIC PANEL WITH GFR
ALT: 7 U/L (ref 0–44)
AST: 16 U/L (ref 15–41)
Albumin: 3 g/dL — ABNORMAL LOW (ref 3.5–5.0)
Alkaline Phosphatase: 39 U/L (ref 38–126)
Anion gap: 17 — ABNORMAL HIGH (ref 5–15)
BUN: <5 mg/dL — ABNORMAL LOW (ref 6–20)
CO2: 21 mmol/L — ABNORMAL LOW (ref 22–32)
Calcium: 8.5 mg/dL — ABNORMAL LOW (ref 8.9–10.3)
Chloride: 94 mmol/L — ABNORMAL LOW (ref 98–111)
Creatinine, Ser: 1 mg/dL (ref 0.44–1.00)
GFR, Estimated: >60 mL/min (ref >60)
Glucose, Bld: 82 mg/dL (ref 70–99)
Potassium: 3.5 mmol/L (ref 3.5–5.1)
Sodium: 132 mmol/L — ABNORMAL LOW (ref 135–145)
Total Bilirubin: 1.3 mg/dL — ABNORMAL HIGH (ref 0.0–1.2)
Total Protein: 7.3 g/dL (ref 6.5–8.1)

## 2023-08-30 LAB — CBC WITH DIFFERENTIAL/PLATELET
Abs Immature Granulocytes: 0.04 K/uL (ref 0.00–0.07)
Basophils Absolute: 0 K/uL (ref 0.0–0.1)
Basophils Relative: 0 %
Eosinophils Absolute: 0 K/uL (ref 0.0–0.5)
Eosinophils Relative: 0 %
HCT: 33.5 % — ABNORMAL LOW (ref 36.0–46.0)
Hemoglobin: 10.6 g/dL — ABNORMAL LOW (ref 12.0–15.0)
Immature Granulocytes: 1 %
Lymphocytes Relative: 13 %
Lymphs Abs: 1.1 K/uL (ref 0.7–4.0)
MCH: 26 pg (ref 26.0–34.0)
MCHC: 31.6 g/dL (ref 30.0–36.0)
MCV: 82.3 fL (ref 80.0–100.0)
Monocytes Absolute: 0.6 K/uL (ref 0.1–1.0)
Monocytes Relative: 7 %
Neutro Abs: 7.1 K/uL (ref 1.7–7.7)
Neutrophils Relative %: 79 %
Platelets: 337 K/uL (ref 150–400)
RBC: 4.07 MIL/uL (ref 3.87–5.11)
RDW: 15.4 % (ref 11.5–15.5)
WBC: 8.8 K/uL (ref 4.0–10.5)
nRBC: 0 % (ref 0.0–0.2)

## 2023-08-30 LAB — I-STAT CHEM 8, ED
BUN: 3 mg/dL — ABNORMAL LOW (ref 6–20)
Calcium, Ion: 1.07 mmol/L — ABNORMAL LOW (ref 1.15–1.40)
Chloride: 95 mmol/L — ABNORMAL LOW (ref 98–111)
Creatinine, Ser: 0.7 mg/dL (ref 0.44–1.00)
Glucose, Bld: 84 mg/dL (ref 70–99)
HCT: 35 % — ABNORMAL LOW (ref 36.0–46.0)
Hemoglobin: 11.9 g/dL — ABNORMAL LOW (ref 12.0–15.0)
Potassium: 3.5 mmol/L (ref 3.5–5.1)
Sodium: 133 mmol/L — ABNORMAL LOW (ref 135–145)
TCO2: 21 mmol/L — ABNORMAL LOW (ref 22–32)

## 2023-08-30 LAB — RESP PANEL BY RT-PCR (RSV, FLU A&B, COVID)  RVPGX2
Influenza A by PCR: NEGATIVE
Influenza B by PCR: NEGATIVE
Resp Syncytial Virus by PCR: NEGATIVE
SARS Coronavirus 2 by RT PCR: NEGATIVE

## 2023-08-30 LAB — PROTIME-INR
INR: 1.1 (ref 0.8–1.2)
Prothrombin Time: 15.3 s — ABNORMAL HIGH (ref 11.4–15.2)

## 2023-08-30 LAB — I-STAT CG4 LACTIC ACID, ED: Lactic Acid, Venous: 0.7 mmol/L (ref 0.5–1.9)

## 2023-08-30 LAB — HCG, SERUM, QUALITATIVE: Preg, Serum: NEGATIVE

## 2023-08-30 LAB — MAGNESIUM: Magnesium: 2 mg/dL (ref 1.7–2.4)

## 2023-08-30 MED ORDER — GUAIFENESIN ER 600 MG PO TB12
600.0000 mg | ORAL_TABLET | Freq: Two times a day (BID) | ORAL | Status: DC
  Administered 2023-08-30 – 2023-09-03 (×8): 600 mg via ORAL
  Filled 2023-08-30 (×8): qty 1

## 2023-08-30 MED ORDER — SODIUM CHLORIDE 0.9 % IV SOLN
100.0000 mg | Freq: Two times a day (BID) | INTRAVENOUS | Status: DC
  Administered 2023-08-31: 100 mg via INTRAVENOUS
  Filled 2023-08-30 (×2): qty 100

## 2023-08-30 MED ORDER — MIDODRINE HCL 5 MG PO TABS
2.5000 mg | ORAL_TABLET | Freq: Three times a day (TID) | ORAL | Status: DC
Start: 2023-08-30 — End: 2023-09-03
  Administered 2023-08-30 – 2023-09-03 (×12): 2.5 mg via ORAL
  Filled 2023-08-30 (×12): qty 1

## 2023-08-30 MED ORDER — BICTEGRAVIR-EMTRICITAB-TENOFOV 50-200-25 MG PO TABS
1.0000 | ORAL_TABLET | Freq: Every day | ORAL | Status: DC
  Administered 2023-08-30: 1 via ORAL
  Filled 2023-08-30 (×2): qty 1

## 2023-08-30 MED ORDER — BUPRENORPHINE HCL 8 MG SL SUBL
8.0000 mg | SUBLINGUAL_TABLET | Freq: Three times a day (TID) | SUBLINGUAL | Status: DC
  Administered 2023-08-30 – 2023-09-03 (×11): 8 mg via SUBLINGUAL
  Filled 2023-08-30: qty 4
  Filled 2023-08-30 (×8): qty 1
  Filled 2023-08-30: qty 4
  Filled 2023-08-30: qty 1

## 2023-08-30 MED ORDER — SODIUM CHLORIDE 0.9 % IV SOLN
2.0000 g | Freq: Once | INTRAVENOUS | Status: AC
  Administered 2023-08-30: 2 g via INTRAVENOUS
  Filled 2023-08-30: qty 20

## 2023-08-30 MED ORDER — ACETAMINOPHEN 650 MG RE SUPP
650.0000 mg | Freq: Four times a day (QID) | RECTAL | Status: DC | PRN
Start: 2023-08-30 — End: 2023-09-03

## 2023-08-30 MED ORDER — LACTATED RINGERS IV SOLN: INTRAVENOUS | Status: AC

## 2023-08-30 MED ORDER — LACTATED RINGERS IV BOLUS (SEPSIS)
1000.0000 mL | Freq: Once | INTRAVENOUS | Status: AC
  Administered 2023-08-30: 1000 mL via INTRAVENOUS

## 2023-08-30 MED ORDER — ACETAMINOPHEN 325 MG PO TABS: 650.0000 mg | ORAL_TABLET | Freq: Four times a day (QID) | ORAL | Status: DC | PRN

## 2023-08-30 MED ORDER — IOHEXOL 350 MG/ML SOLN
75.0000 mL | Freq: Once | INTRAVENOUS | Status: AC | PRN
  Administered 2023-08-30: 75 mL via INTRAVENOUS

## 2023-08-30 MED ORDER — SODIUM CHLORIDE 0.9 % IV SOLN
2.0000 g | Freq: Three times a day (TID) | INTRAVENOUS | Status: DC
  Administered 2023-08-30 – 2023-08-31 (×3): 2 g via INTRAVENOUS
  Filled 2023-08-30 (×3): qty 12.5

## 2023-08-30 MED ORDER — ONDANSETRON HCL 4 MG/2ML IJ SOLN: 4.0000 mg | Freq: Four times a day (QID) | INTRAMUSCULAR | Status: DC | PRN

## 2023-08-30 MED ORDER — LACTATED RINGERS IV BOLUS (SEPSIS)
500.0000 mL | Freq: Once | INTRAVENOUS | Status: AC
  Administered 2023-08-30: 500 mL via INTRAVENOUS

## 2023-08-30 MED ORDER — MELATONIN 3 MG PO TABS
3.0000 mg | ORAL_TABLET | Freq: Every evening | ORAL | Status: DC | PRN
  Filled 2023-08-30: qty 1

## 2023-08-30 MED ORDER — SODIUM CHLORIDE 0.9 % IV SOLN
100.0000 mg | Freq: Once | INTRAVENOUS | Status: AC
  Administered 2023-08-30: 100 mg via INTRAVENOUS
  Filled 2023-08-30: qty 100

## 2023-08-30 MED ORDER — BENZONATATE 100 MG PO CAPS: 200.0000 mg | ORAL_CAPSULE | Freq: Three times a day (TID) | ORAL | Status: DC | PRN

## 2023-08-30 NOTE — ED Notes (Signed)
 Pt refusing this RN IV attempt. Pt requesting ultrasound IV only.

## 2023-08-30 NOTE — ED Provider Notes (Signed)
 [place] EMERGENCY DEPARTMENT AT [facility] Provider Note   CSN: [id number] Arrival date & time: 08/30/23  1605     Patient presents with: Shortness of Breath   Frances Ball is a 33 y.o. female.   Patient presents with worsening cough and breathing difficulties over the past 2 weeks.  Patient recently seen at outside emergency room and on oral antibiotics with no improvement.  Patient has history of HIV and has not been taking her meds for 6 months.  Patient has history of factor V with no history of blood clot.  No leg swelling.  Intermittent fevers.  No known sick contacts.  No recent admission to the hospital.  The history is provided by the patient.  Shortness of Breath Associated symptoms: cough and fever   Associated symptoms: no abdominal pain, no chest pain, no headaches, no neck pain, no rash and no vomiting        Prior to Admission medications   Medication Sig Start Date End Date Taking? Authorizing Provider  albuterol  (VENTOLIN  HFA) 108 (90 Base) MCG/ACT inhaler Inhale 2 puffs into the lungs every 6 (six) hours as needed for wheezing or shortness of breath. 04/17/21   Melvenia Corean SAILOR, NP  bictegravir-emtricitabine -tenofovir  AF (BIKTARVY ) 50-200-25 MG TABS tablet Take 1 tablet by mouth daily. 08/28/22   Melvenia Corean SAILOR, NP  dapsone  100 MG tablet Take 1 tablet (100 mg total) by mouth daily. 08/31/22   Melvenia Corean SAILOR, NP  dapsone  100 MG tablet Take 1 tablet (100 mg total) by mouth daily. 08/31/22   Melvenia Corean SAILOR, NP  midodrine  (PROAMATINE ) 2.5 MG tablet Take 1 tablet (2.5 mg total) by mouth 3 (three) times daily with meals. 08/28/22   Melvenia Corean SAILOR, NP  ondansetron  (ZOFRAN ) 4 MG tablet Take 1-2 tablets 4 mg by mouth every 8 (eight) hours as needed for nausea or vomiting. 11/30/22   Melvenia Corean SAILOR, NP  SUBOXONE  8-2 MG FILM Place 1 Film under the tongue in the morning and at bedtime. 06/16/18   [provider]    Allergies: Levaquin  [levofloxacin in d5w], Zithromax [azithromycin], Bactrim [sulfamethoxazole-trimethoprim], Sulfa antibiotics, Vancomycin , and Darunavir     Review of Systems  Constitutional:  Positive for appetite change, fatigue and fever. Negative for chills.  HENT:  Negative for congestion.   Eyes:  Negative for visual disturbance.  Respiratory:  Positive for cough and shortness of breath.   Cardiovascular:  Negative for chest pain.  Gastrointestinal:  Positive for nausea. Negative for abdominal pain and vomiting.  Genitourinary:  Negative for dysuria and flank pain.  Musculoskeletal:  Negative for back pain, neck pain and neck stiffness.  Skin:  Negative for rash.  Neurological:  Negative for light-headedness and headaches.    Updated Vital Signs BP 96/70   Pulse (!) 109   Temp 99.7 F (37.6 C) (Oral)   Resp (!) 22   Ht 5' 2 (1.575 m)   Wt 47.2 kg   SpO2 98%   BMI 19.03 kg/m   Physical Exam Vitals and nursing note reviewed.  Constitutional:      General: She is not in acute distress.    Appearance: She is well-developed. She is ill-appearing.  HENT:     Head: Normocephalic and atraumatic.     Comments: Dry mm    Mouth/Throat:     Mouth: Mucous membranes are moist.  Eyes:     General:        Right eye: No discharge.  Left eye: No discharge.     Conjunctiva/sclera: Conjunctivae normal.  Neck:     Trachea: No tracheal deviation.  Cardiovascular:     Rate and Rhythm: Regular rhythm. Tachycardia present.     Heart sounds: No murmur heard. Pulmonary:     Effort: Tachypnea present.     Breath sounds: Examination of the right-middle field reveals rhonchi. Examination of the left-middle field reveals rhonchi. Rhonchi present.  Abdominal:     General: There is no distension.     Palpations: Abdomen is soft.     Tenderness: There is no abdominal tenderness. There is no guarding.  Musculoskeletal:     Cervical back: Normal range of motion and neck supple. No rigidity.  Skin:     General: Skin is warm.     Capillary Refill: Capillary refill takes less than 2 seconds.     Findings: No rash.  Neurological:     General: No focal deficit present.     Mental Status: She is alert.     Cranial Nerves: No cranial nerve deficit.  Psychiatric:     Comments: Tired appearing this     (all labs ordered are listed, but only abnormal results are displayed) Labs Reviewed  COMPREHENSIVE METABOLIC PANEL WITH GFR - Abnormal; Notable for the following components:      Result Value   Sodium 132 (*)    Chloride 94 (*)    CO2 21 (*)    BUN <5 (*)    Calcium 8.5 (*)    Albumin  3.0 (*)    Total Bilirubin 1.3 (*)    Anion gap 17 (*)    All other components within normal limits  CBC WITH DIFFERENTIAL/PLATELET - Abnormal; Notable for the following components:   Hemoglobin 10.6 (*)    HCT 33.5 (*)    All other components within normal limits  PROTIME-INR - Abnormal; Notable for the following components:   Prothrombin Time 15.3 (*)    All other components within normal limits  I-STAT CHEM 8, ED - Abnormal; Notable for the following components:   Sodium 133 (*)    Chloride 95 (*)    BUN 3 (*)    Calcium, Ion 1.07 (*)    TCO2 21 (*)    Hemoglobin 11.9 (*)    HCT 35.0 (*)    All other components within normal limits  RESP PANEL BY RT-PCR (RSV, FLU A&B, COVID)  RVPGX2  CULTURE, BLOOD (ROUTINE X 2)  CULTURE, BLOOD (ROUTINE X 2)  HCG, SERUM, QUALITATIVE  I-STAT CG4 LACTIC ACID, ED    EKG: None  Radiology: CT Angio Chest PE W and/or Wo Contrast Result Date: 08/30/2023 CLINICAL DATA:  Tachycardia, dyspnea, factor 5 Leiden hypercoagulability EXAM: CT ANGIOGRAPHY CHEST WITH CONTRAST TECHNIQUE: Multidetector CT imaging of the chest was performed using the standard protocol during bolus administration of intravenous contrast. Multiplanar CT image reconstructions and MIPs were obtained to evaluate the vascular anatomy. RADIATION DOSE REDUCTION: This exam was performed according to  the departmental dose-optimization program which includes automated exposure control, adjustment of the mA and/or kV according to patient size and/or use of iterative reconstruction technique. CONTRAST:  75mL OMNIPAQUE  IOHEXOL  350 MG/ML SOLN COMPARISON:  None Available. FINDINGS: Cardiovascular: Adequate opacification of the pulmonary arterial tree. No intraluminal filling defect identified to suggest acute pulmonary embolism. Central pulmonary arteries are of normal caliber. No significant coronary artery calcification. Cardiac size is within normal limits. No pericardial effusion. No signet atherosclerotic calcification within the thoracic aorta. No  aortic aneurysm. Mediastinum/Nodes: Shotty left hilar and aortopulmonary window lymphadenopathy is likely reactive in nature. No frankly pathologic thoracic adenopathy. Visualized thyroid  is unremarkable. Esophagus is unremarkable. Lungs/Pleura: There is dense consolidation of the left lower lobe with extensive airway impaction. Scatter nodular airspace infiltrate within the lingula and to a lesser extent right lower lobe most in keeping with changes acute infection in the appropriate clinical setting. No pneumothorax or pleural effusion. Upper Abdomen: No acute abnormality. Musculoskeletal: No chest wall abnormality. No acute or significant osseous findings. Review of the MIP images confirms the above findings. IMPRESSION: 1. No evidence of acute pulmonary embolism. 2. Dense consolidation of the left lower lobe with extensive airway impaction. Scatter nodular airspace infiltrate within the lingula and to a lesser extent right lower lobe most in keeping with changes acute infection in the appropriate clinical setting. Electronically Signed   By: Dorethia Molt M.D.   On: 08/30/2023 19:53   DG Chest Port 1 View Result Date: 08/30/2023 CLINICAL DATA:  Question of sepsis to evaluate for abnormality. Shortness of breath and dizziness ongoing. Recent diagnosis of  pneumonia with outpatient antibiotic treatment without relief. EXAM: PORTABLE CHEST 1 VIEW COMPARISON:  08/18/2023 FINDINGS: Heart size and pulmonary vascularity are normal. Emphysematous changes in the lungs. There is volume loss and consolidation in the left lower lung, new since prior study. This likely represents pneumonia given the clinical symptoms. Follow-up to resolution is recommended to exclude underlying obstructing neoplasm. Right lung is clear. No pleural effusion. No pneumothorax. Mediastinal contours appear intact. IMPRESSION: Left lower lung volume loss and consolidation likely pneumonia. Follow-up to resolution is recommended to exclude underlying obstructing neoplasm. Electronically Signed   By: Elsie Gravely M.D.   On: 08/30/2023 16:58     .Critical Care  Performed by: Tonia Chew, MD Authorized by: Tonia Chew, MD   Critical care provider statement:    Critical care time (minutes):  40   Critical care start time:  08/30/2023 7:40 PM   Critical care end time:  08/30/2023 8:20 PM   Critical care time was exclusive of:  Separately billable procedures and treating other patients and teaching time   Critical care was necessary to treat or prevent imminent or life-threatening deterioration of the following conditions:  Sepsis   Critical care was time spent personally by me on the following activities:  Pulse oximetry, re-evaluation of patient's condition, ordering and review of radiographic studies, ordering and review of laboratory studies and ordering and performing treatments and interventions    Medications Ordered in the ED  lactated ringers  infusion (has no administration in time range)  lactated ringers  bolus 1,000 mL (0 mLs Intravenous Stopped 08/30/23 2022)    And  lactated ringers  bolus 500 mL (has no administration in time range)  cefTRIAXone  (ROCEPHIN ) 2 g in sodium chloride  0.9 % 100 mL IVPB (0 g Intravenous Stopped 08/30/23 1917)  iohexol  (OMNIPAQUE ) 350 MG/ML  injection 75 mL (75 mLs Intravenous Contrast Given 08/30/23 1923)    Clinical Course as of 08/30/23 2025  Mon Aug 30, 2023  1659 ED EKG [RC]    Clinical Course User Index [RC] Sharyne Darina RAMAN, MD                                 Medical Decision Making Amount and/or Complexity of Data Reviewed Radiology: ordered.  Risk Prescription drug management.   Patient with history of HIV noncompliant medications, pneumonia in the  past presents with known pneumonia and worsening signs and symptoms.  Patient not requiring oxygen however tachypnea on exam and tachycardia.  Patient low risk blood clot, factor V Leiden history per her report, CT scan ordered for further delineation of chest x-ray and look for blood clots, negative pulmonary embolism, details of pneumonia described.  Blood work consistent with hyponatremia likely from dehydration/infection patient is not tolerated oral fluids well in the last week.  Sodium 132, anemia of chronic 2.6.  Normal white blood cell count.  Discussed with pharmacy to clarify IV antibiotics.  Started with Rocephin .  IV fluid bolus given in case early sepsis, lactate independently viewed negative.  Paged unassigned for admission.       Final diagnoses:  Pneumonia of left lower lobe due to infectious organism  Hyponatremia  Acute on chronic anemia    ED Discharge Orders     None          Tonia Chew, MD 08/30/23 2025

## 2023-08-30 NOTE — ED Notes (Signed)
 IV team at bedside

## 2023-08-30 NOTE — ED Provider Triage Note (Signed)
 Emergency Medicine Provider Triage Evaluation Note  Frances Ball , a 33 y.o. female  was evaluated in triage.  Pt complains of worsening shortness of breath, cough.  States previously about 2 weeks ago she was diagnosed with bilateral pneumonia.  Has been taken cefdinir .  Patient is HIV positive.  Review of Systems  Positive: As above Negative: As above  Physical Exam  BP 94/74 (BP Location: Right Arm)   Pulse (!) 154   Temp 99.4 F (37.4 C)   Resp 20   SpO2 95%  Gen:   Awake, no distress   Resp:  Normal effort  MSK:   Moves extremities without difficulty  Other:    Medical Decision Making  Medically screening exam initiated at 4:20 PM.  Appropriate orders placed.  Frances Ball was informed that the remainder of the evaluation will be completed by another provider, this initial triage assessment does not replace that evaluation, and the importance of remaining in the ED until their evaluation is complete.  Low-grade temperature, tachycardic, soft blood pressures.  Sepsis labs ordered.  Patient will get to a room soon as she cannot   Frances Ball, [place] 08/30/23 1621

## 2023-08-30 NOTE — ED Triage Notes (Signed)
 Pt arrives with c/o SOB and dizziness that has been ongoing. Per pt, she was diagnosed with PNA and treated with outpt antibiotics without relief. Pt is HIV positive and has not been on her meds in about 6 months. Pt is tachycardic in triage in the 140s. Pt reports fevers.
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
## 2023-08-31 ENCOUNTER — Inpatient Hospital Stay (HOSPITAL_COMMUNITY)

## 2023-08-31 DIAGNOSIS — G894 Chronic pain syndrome: Secondary | ICD-10-CM | POA: Diagnosis present

## 2023-08-31 DIAGNOSIS — J189 Pneumonia, unspecified organism: Secondary | ICD-10-CM | POA: Diagnosis not present

## 2023-08-31 DIAGNOSIS — E8729 Other acidosis: Secondary | ICD-10-CM | POA: Diagnosis present

## 2023-08-31 DIAGNOSIS — J181 Lobar pneumonia, unspecified organism: Secondary | ICD-10-CM

## 2023-08-31 DIAGNOSIS — R519 Headache, unspecified: Secondary | ICD-10-CM

## 2023-08-31 LAB — CBC WITH DIFFERENTIAL/PLATELET
Abs Immature Granulocytes: 0.02 K/uL (ref 0.00–0.07)
Basophils Absolute: 0 K/uL (ref 0.0–0.1)
Basophils Relative: 0 %
Eosinophils Absolute: 0 K/uL (ref 0.0–0.5)
Eosinophils Relative: 0 %
HCT: 31.9 % — ABNORMAL LOW (ref 36.0–46.0)
Hemoglobin: 10.1 g/dL — ABNORMAL LOW (ref 12.0–15.0)
Immature Granulocytes: 1 %
Lymphocytes Relative: 16 %
Lymphs Abs: 0.6 K/uL — ABNORMAL LOW (ref 0.7–4.0)
MCH: 26.4 pg (ref 26.0–34.0)
MCHC: 31.7 g/dL (ref 30.0–36.0)
MCV: 83.3 fL (ref 80.0–100.0)
Monocytes Absolute: 0.3 K/uL (ref 0.1–1.0)
Monocytes Relative: 7 %
Neutro Abs: 3.1 K/uL (ref 1.7–7.7)
Neutrophils Relative %: 76 %
Platelets: 292 K/uL (ref 150–400)
RBC: 3.83 MIL/uL — ABNORMAL LOW (ref 3.87–5.11)
RDW: 15.7 % — ABNORMAL HIGH (ref 11.5–15.5)
WBC: 4.1 K/uL (ref 4.0–10.5)
nRBC: 0 % (ref 0.0–0.2)

## 2023-08-31 LAB — COMPREHENSIVE METABOLIC PANEL WITH GFR
ALT: 7 U/L (ref 0–44)
AST: 13 U/L — ABNORMAL LOW (ref 15–41)
Albumin: 2.6 g/dL — ABNORMAL LOW (ref 3.5–5.0)
Alkaline Phosphatase: 31 U/L — ABNORMAL LOW (ref 38–126)
Anion gap: 14 (ref 5–15)
BUN: <5 mg/dL — ABNORMAL LOW (ref 6–20)
CO2: 23 mmol/L (ref 22–32)
Calcium: 8.2 mg/dL — ABNORMAL LOW (ref 8.9–10.3)
Chloride: 101 mmol/L (ref 98–111)
Creatinine, Ser: 0.88 mg/dL (ref 0.44–1.00)
GFR, Estimated: >60 mL/min (ref >60)
Glucose, Bld: 84 mg/dL (ref 70–99)
Potassium: 3.8 mmol/L (ref 3.5–5.1)
Sodium: 138 mmol/L (ref 135–145)
Total Bilirubin: 0.8 mg/dL (ref 0.0–1.2)
Total Protein: 6.3 g/dL — ABNORMAL LOW (ref 6.5–8.1)

## 2023-08-31 LAB — RESPIRATORY PANEL BY PCR

## 2023-08-31 LAB — URINALYSIS, COMPLETE (UACMP) WITH MICROSCOPIC
Bacteria, UA: NONE SEEN
Bilirubin Urine: NEGATIVE
Glucose, UA: NEGATIVE mg/dL
Hgb urine dipstick: NEGATIVE
Ketones, ur: 5 mg/dL — AB
Nitrite: NEGATIVE
Protein, ur: NEGATIVE mg/dL
Specific Gravity, Urine: 1.017 (ref 1.005–1.030)
pH: 5 (ref 5.0–8.0)

## 2023-08-31 LAB — URIC ACID: Uric Acid, Serum: 6.2 mg/dL (ref 2.5–7.1)

## 2023-08-31 LAB — TSH: TSH: 0.71 u[IU]/mL (ref 0.350–4.500)

## 2023-08-31 LAB — PHOSPHORUS: Phosphorus: 2.7 mg/dL (ref 2.5–4.6)

## 2023-08-31 LAB — MAGNESIUM: Magnesium: 1.9 mg/dL (ref 1.7–2.4)

## 2023-08-31 LAB — PROCALCITONIN: Procalcitonin: <0.1 ng/mL

## 2023-08-31 LAB — CRYPTOCOCCAL ANTIGEN: Crypto Ag: NEGATIVE

## 2023-08-31 LAB — STREP PNEUMONIAE URINARY ANTIGEN: Strep Pneumo Urinary Antigen: NEGATIVE

## 2023-08-31 LAB — OSMOLALITY: Osmolality: 289 mosm/kg (ref 275–295)

## 2023-08-31 LAB — MRSA NEXT GEN BY PCR, NASAL: MRSA by PCR Next Gen: NOT DETECTED

## 2023-08-31 MED ORDER — GADOBUTROL 1 MMOL/ML IV SOLN
4.0000 mL | Freq: Once | INTRAVENOUS | Status: AC | PRN
  Administered 2023-08-31: 4 mL via INTRAVENOUS

## 2023-08-31 MED ORDER — SODIUM CHLORIDE 0.9 % IV SOLN
2.0000 g | Freq: Three times a day (TID) | INTRAVENOUS | Status: DC
  Administered 2023-08-31 – 2023-09-03 (×8): 2 g via INTRAVENOUS
  Filled 2023-08-31 (×8): qty 12.5

## 2023-08-31 MED ORDER — SODIUM CHLORIDE 0.9 % IV SOLN
2.0000 g | Freq: Three times a day (TID) | INTRAVENOUS | Status: DC
  Filled 2023-08-31: qty 12.5

## 2023-08-31 MED ORDER — AMOXICILLIN-POT CLAVULANATE 875-125 MG PO TABS
1.0000 | ORAL_TABLET | Freq: Two times a day (BID) | ORAL | Status: DC
  Administered 2023-08-31: 1 via ORAL
  Filled 2023-08-31: qty 1

## 2023-08-31 MED ORDER — ACETYLCYSTEINE 20 % IN SOLN
3.0000 mL | Freq: Two times a day (BID) | RESPIRATORY_TRACT | Status: DC
  Administered 2023-08-31 – 2023-09-01 (×3): 3 mL via RESPIRATORY_TRACT
  Filled 2023-08-31 (×5): qty 4

## 2023-08-31 MED ORDER — ONDANSETRON HCL 4 MG PO TABS: 4.0000 mg | ORAL_TABLET | Freq: Four times a day (QID) | ORAL | Status: DC | PRN

## 2023-08-31 MED ORDER — ONDANSETRON HCL 4 MG/2ML IJ SOLN: 4.0000 mg | Freq: Four times a day (QID) | INTRAMUSCULAR | Status: DC | PRN

## 2023-08-31 MED ORDER — BICTEGRAVIR-EMTRICITAB-TENOFOV 50-200-25 MG PO TABS
1.0000 | ORAL_TABLET | Freq: Every day | ORAL | Status: DC
  Filled 2023-08-31: qty 1

## 2023-08-31 MED ORDER — ALBUTEROL SULFATE (2.5 MG/3ML) 0.083% IN NEBU
2.5000 mg | INHALATION_SOLUTION | Freq: Four times a day (QID) | RESPIRATORY_TRACT | Status: DC | PRN
  Administered 2023-08-31 – 2023-09-01 (×2): 2.5 mg via RESPIRATORY_TRACT
  Filled 2023-08-31 (×3): qty 3

## 2023-08-31 MED ORDER — DOXYCYCLINE HYCLATE 100 MG PO TABS
100.0000 mg | ORAL_TABLET | Freq: Two times a day (BID) | ORAL | Status: DC
  Administered 2023-08-31 – 2023-09-03 (×6): 100 mg via ORAL
  Filled 2023-08-31 (×6): qty 1

## 2023-08-31 NOTE — TOC CM/SW Note (Signed)
 Transition of Care [facility]) - Inpatient Brief Assessment   Patient Details  Name: Frances Ball MRN: [id number] Date of Birth: [birth year]  Transition of Care [facility]) CM/SW Contact:    Lauraine FORBES Saa, LCSW Phone Number: 08/31/2023, 12:54 PM   Clinical Narrative:  12:54 PM Per chart review, patient resides at home with child(ren). Patient has insurance but does not have a PCP. Patient does not have SNF/HH/DME history. Patient's preferred pharmacy's are [facility] and [facility] [phone] [place]. TOC will continue to follow and be available to assist.  Transition of Care Asessment: Insurance and Status: Insurance coverage has been reviewed Patient has primary care physician: No Home environment has been reviewed: Private Residence Prior level of function:: N/A Prior/Current Home Services: No current home services Social Drivers of Health Review: SDOH reviewed no interventions necessary Readmission risk has been reviewed: Yes Transition of care needs: transition of care needs identified, TOC will continue to follow

## 2023-08-31 NOTE — Consult Note (Signed)
 NAME:  Frances Ball, MRN:  [id number], DOB:  Mar 22, 1990, LOS: 1 ADMISSION DATE:  08/30/2023, CONSULTATION DATE:  08/31/23 REFERRING MD: Tobie READ CHIEF COMPLAINT: Pulm consult for pneumonia versus other  History of Present Illness:  33 year old female with past medical history of IV drug use, factor V Leiden without history of blood clot, HIV/AIDS presented to the emergency department on 7/28 with complaint of shortness of breath.  She was evaluated in the outpatient setting for cough and was placed on cefdinir .  States that she has been taking this for the past 10 days but continues to have no improvement in her symptoms.  Has had suboptimal compliance on Biktarvy  and has not been taking HIV medication for 6 months.  She has transportation issues and has not been seen in the ID clinic for a long time, also reports issues with homelessness, just could not go to get my prescriptions filled  in the ED had temp of 99.7, tachycardic.  She had CTA performed which was negative for PE but did show dense consolidation in the left lower lobe with airway impaction, infiltrate in the lingula and right lower lobe.  No leukocytosis.  She was admitted to hospitalist service.  She was broadened and started on cefepime , doxycycline , azithromycin.  7/20 and hospitalist involved infectious disease.  Most recent CD4 count 166.  They have placed ID workup.  PCCM consulted for evaluation of CT chest imaging and opinion on if this is pneumonia versus other.  She reports green sputum that smells and taste nasty She reports a diagnosis of bronchiectasis in 2020 , chart review shows seen by Dr. Darlean Spirometry attempted but not good effort  Pertinent  Medical History  IV drug use, factor V Leiden, HIV/AIDS  Significant Hospital Events: Including procedures, antibiotic start and stop dates in addition to other pertinent events   7/28: ED presentation for pneumonia with failed outpatient treatment.  Admitted and placed  on cefepime , Doxy, azithromycin.  Blood cultures obtained before antibiotics started. 7/29: ID involvement given AIDS status.  Pulmonary consult for assistance with chest imaging and management.  MRI brain negative  Interim History / Subjective:  Afebrile On room air  Objective   Blood pressure 102/74, pulse 94, temperature 98.3 F (36.8 C), temperature source Oral, resp. rate 18, height 5' 2 (1.575 m), weight 46.3 kg, SpO2 100%.       No intake or output data in the 24 hours ending 08/31/23 1541 Filed Weights   08/30/23 1621 08/30/23 1700 08/31/23 1224  Weight: 47.2 kg 47.2 kg 46.3 kg    Examination: General: Thin woman sitting up in bed no distress HENT: Mild pallor, no icterus no lymphadenopathy Lungs: Clear to auscultation, no accessory muscle use Cardiovascular: S1-S2 regular Abdomen: Soft, nontender, scaphoid Extremities: No edema, no deformity Neuro: Alert, interactive, nonfocal   Labs show no leukocytosis, normal hemoglobin 10.1, normal LFTs, albumin  2.6 Review of prior imaging : CT abdomen/pelvis 12/31/2021 shows left bronchiectasis CTA chest 01/2022 shows patchy airspace disease throughout CT chest/abdomen/pelvis 06/2021 right middle and bilateral lower lobe pneumonia  Resolved Hospital Problem list    Assessment & Plan:   Left lower lobe bronchiectasis Multifocal pneumonia in the setting of immunocompromise status and failed outpatient antibiotics 2 weeks of symptoms with progressive dyspnea.  Initially treated with 10 days of cefdinir .  CT chest on admission with dense consolidation in the left lower lobe, infiltrate in the lingula and right lower lobe.  Placed on cefepime , Doxy, azithromycin.  Infectious disease  involved. Strep pneumonia and negative Respiratory 20 pathogen panel negative MRSA PCR negative Cryptococcal antigen negative Recurrent pneumonias explained by bronchiectasis, await CD4 testing  - Concern here would be for Pseudomonas, given poor  response to cefdinir  especially in the setting of bronchiectasis, would broaden to cefepime .  Doubt bronchoscopy necessary here but will observe response to treatment   Below per primary HIV/AIDS Chronic hep C, treated by ID - ID involved, appreciate assistance in management Factor V Leiden  Best Practice (right click and Reselect all SmartList Selections daily)   Code Status:  full code Last date of multidisciplinary goals of care discussion [NA]  Labs   CBC: Recent Labs  Lab 08/30/23 1805 08/30/23 1814 08/31/23 0250  WBC 8.8  --  4.1  NEUTROABS 7.1  --  3.1  HGB 10.6* 11.9* 10.1*  HCT 33.5* 35.0* 31.9*  MCV 82.3  --  83.3  PLT 337  --  292    Basic Metabolic Panel: Recent Labs  Lab 08/30/23 1805 08/30/23 1808 08/30/23 1814 08/31/23 0250  NA 132*  --  133* 138  K 3.5  --  3.5 3.8  CL 94*  --  95* 101  CO2 21*  --   --  23  GLUCOSE 82  --  84 84  BUN <5*  --  3* <5*  CREATININE 1.00  --  0.70 0.88  CALCIUM 8.5*  --   --  8.2*  MG  --  2.0  --  1.9  PHOS  --   --   --  2.7   GFR: Estimated Creatinine Clearance: 65.8 mL/min (by C-G formula based on SCr of 0.88 mg/dL). Recent Labs  Lab 08/30/23 1805 08/30/23 1808 08/30/23 1814 08/31/23 0250  PROCALCITON  --  <0.10  --   --   WBC 8.8  --   --  4.1  LATICACIDVEN  --   --  0.7  --     Liver Function Tests: Recent Labs  Lab 08/30/23 1805 08/31/23 0250  AST 16 13*  ALT 7 7  ALKPHOS 39 31*  BILITOT 1.3* 0.8  PROT 7.3 6.3*  ALBUMIN  3.0* 2.6*   No results for input(s): LIPASE, AMYLASE in the last 168 hours. No results for input(s): AMMONIA in the last 168 hours.  ABG    Component Value Date/Time   HCO3 26.0 06/24/2021 2105   TCO2 21 (L) 08/30/2023 1814   O2SAT 85.6 06/24/2021 2105     Coagulation Profile: Recent Labs  Lab 08/30/23 1805  INR 1.1    Cardiac Enzymes: No results for input(s): CKTOTAL, CKMB, CKMBINDEX, TROPONINI in the last 168 hours.  HbA1C: No results  found for: HGBA1C  CBG: No results for input(s): GLUCAP in the last 168 hours.  Review of Systems:   As noted in HPI  Past Medical History:  She,  has a past medical history of Clostridium difficile colitis, HCAP (healthcare-associated pneumonia) (03/15/2015), History of blood transfusion (02/03/2007), HIV (human immunodeficiency virus infection) (HCC) (dx'd 2009), Influenza A (H1N1), Intravenous drug user, Neuralgia, post-herpetic, Pneumonia (02/2014; 01/2015), PTSD (post-traumatic stress disorder), and Trichomonas vaginitis.   Surgical History:   Past Surgical History:  Procedure Laterality Date   APPENDECTOMY  1990s   CESAREAN SECTION  2011; 2013   PERIPHERALLY INSERTED CENTRAL CATHETER INSERTION Right 06/2014; 01/2014; 03/15/2015   upper arm each time   TONSILLECTOMY AND ADENOIDECTOMY  1990s     Social History:   reports that she has never smoked. She has never  used smokeless tobacco. She reports that she does not drink alcohol and does not use drugs.   Family History:  Her family history includes Diabetes in her father; Hypertension in her father.   Allergies Allergies  Allergen Reactions   Levaquin [Levofloxacin In D5w] Shortness Of Breath and Rash   Zithromax [Azithromycin] Shortness Of Breath, Swelling and Other (See Comments)    Pt reports tightness in skin with redness    Bactrim [Sulfamethoxazole-Trimethoprim] Swelling and Other (See Comments)    Pt reports that she was placed on Bactrim for pneumonia when she was d/c from [facility] and developed a red face with swelling and can not tolerate Bactrim.    Sulfa Antibiotics Swelling    Facial swelling and burning   Vancomycin  Other (See Comments)    Intolerance, pt states that as long as med is administered with Benadryl  its tolarable   Darunavir  Rash     Home Medications  Prior to Admission medications   Medication Sig Start Date End Date Taking? Authorizing Provider  albuterol  (VENTOLIN  HFA) 108 (90  Base) MCG/ACT inhaler Inhale 2 puffs into the lungs every 6 (six) hours as needed for wheezing or shortness of breath. 04/17/21  Yes Melvenia Corean SAILOR, NP  bictegravir-emtricitabine -tenofovir  AF (BIKTARVY ) 50-200-25 MG TABS tablet Take 1 tablet by mouth daily. 08/28/22  Yes Melvenia Corean SAILOR, NP  buprenorphine  (SUBUTEX ) 8 MG SUBL SL tablet Place 8 mg under the tongue 3 (three) times daily. 08/10/23  Yes [provider]  cefdinir  (OMNICEF ) 300 MG capsule Take 300 mg by mouth 2 (two) times daily. 08/18/23  Yes [provider]  ondansetron  (ZOFRAN ) 4 MG tablet Take 1-2 tablets 4 mg by mouth every 8 (eight) hours as needed for nausea or vomiting. 11/30/22  Yes Melvenia Corean SAILOR, NP  midodrine  (PROAMATINE ) 2.5 MG tablet Take 1 tablet (2.5 mg total) by mouth 3 (three) times daily with meals. Patient not taking: Reported on 08/30/2023 08/28/22   Melvenia Corean SAILOR, NP  predniSONE  (DELTASONE ) 20 MG tablet Take 20 mg by mouth 2 (two) times daily. Patient not taking: Reported on 08/30/2023 08/10/23   [provider]     Harden Staff MD. [facility]. [facility] Pager : [phone]  If no response to pager , please call [phone] until 7 pm After 7:00 pm call Elink  [phone]   08/31/2023
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
## 2023-08-31 NOTE — Progress Notes (Signed)
 [facility] Progress Note Patient: Frances Ball FMW:[id number] DOB: 1990/06/22 DOA: 08/30/2023  DOS: the patient was seen and examined on 08/31/2023  Brief Hospital Course: Patient with PMH of HIV noncompliant with medicines, chronic bronchiectasis, anemia, hep C, failure to thrive presented to the hospital with complaints of cough shortness of breath chest pain as well as weight loss. Currently being admitted for pneumonia. ID consulted. Assessment and Plan: Recurrent pneumonia. Chronic bronchiectasis. Presented from nursing cough and shortness of breath as well as left-sided chest pain. Symptoms have been ongoing for last few months. Was treated for pneumonia in April, early July. CT scan shows unresolving pneumonia. Patient reports weight loss as well as fever with a temp going up to 104 few days ago. Patient is producing sputum without any blood. ID consulted for further assistance given her history of HIV. Continue with antibiotics, workup expanded to include possibility of TB/PJP pneumonia. Will follow further workup and ID recommendation.  Pulmonary consultation. Patient has recurrent pneumonia secondary to bronchiectasis. Procalcitonin is negative. Concern for possible hidden malignancy especially in the setting of HIV. For now we will rule out infection which is more likely.  If on repeat imaging pneumonia is not improving, recommendation will be to follow-up with pulmonary.  Unintentional weight loss. Failure to thrive. In the setting of infection. Monitor for now. Dietitian consulted.  Hyponatremia. Clinically not significant. Will monitor.  Anion gap acidosis. Likely starvation in the setting of poor p.o. intake. Improving already. Monitor.  Chronic hypotension. Systolic BP in 90s. Improving gentle IV hydration.  Also on midodrine .  Patient is chronically on this medication. Monitor.  Headache. Reports left temporal headache. Further imaging ordered  by ID.  History of HIV and AIDS. Noncompliant with medication. Management per ID. Check CD4 and viral count.  Chronic pain syndrome. Home regimen includes Suboxone /buprenorphine  8 mg tablet 3 times daily. For now continued. Select  Subjective: Continues to have cough continues to have chest pain No headache.  No nausea no vomiting no fever no chills.  Minimal oral intake.  Physical Exam: Basilar crackles on the left. S1-S2 present. Bowel sounds present.  Nontender. No edema. Chaperone was present at the time of examination. Data Reviewed: I have Reviewed nursing notes, Vitals, and Lab results. Since last encounter, pertinent lab results CBC and BMP   . I have ordered test including CBC and BMP  . I have discussed pt's care plan and test results with ID  . I have ordered imaging none  .   Disposition: Status is: Inpatient Remains inpatient appropriate because: Needed antibiotic.  SCDs Start: 08/30/23 2041   Family Communication: Family at bedside.  Patient provided permission. Level of care: Progressive   Vitals:   08/31/23 1206 08/31/23 1224 08/31/23 1225 08/31/23 1542  BP: 101/74  102/74 107/74  Pulse: 94   96  Resp: 18     Temp: 98 F (36.7 C)  98.3 F (36.8 C) 98.9 F (37.2 C)  TempSrc: Oral  Oral Oral  SpO2:    96%  Weight:  46.3 kg    Height:  5' 2 (1.575 m)       Author: Yetta Blanch, MD 08/31/2023 4:54 PM  Please look on [email or web address] to find out who is on call.

## 2023-09-01 ENCOUNTER — Other Ambulatory Visit: Payer: Self-pay

## 2023-09-01 DIAGNOSIS — J189 Pneumonia, unspecified organism: Secondary | ICD-10-CM | POA: Diagnosis not present

## 2023-09-01 DIAGNOSIS — J479 Bronchiectasis, uncomplicated: Secondary | ICD-10-CM

## 2023-09-01 DIAGNOSIS — D649 Anemia, unspecified: Secondary | ICD-10-CM | POA: Diagnosis not present

## 2023-09-01 DIAGNOSIS — B20 Human immunodeficiency virus [HIV] disease: Secondary | ICD-10-CM | POA: Diagnosis not present

## 2023-09-01 LAB — SODIUM, URINE, RANDOM: Sodium, Ur: 80 mmol/L

## 2023-09-01 LAB — OSMOLALITY, URINE: Osmolality, Ur: 542 mosm/kg (ref 300–900)

## 2023-09-01 LAB — EXPECTORATED SPUTUM ASSESSMENT W GRAM STAIN, RFLX TO RESP C

## 2023-09-01 LAB — RAPID URINE DRUG SCREEN, HOSP PERFORMED
Amphetamines: NOT DETECTED
Barbiturates: NOT DETECTED
Benzodiazepines: NOT DETECTED
Cocaine: NOT DETECTED
Opiates: NOT DETECTED
Tetrahydrocannabinol: NOT DETECTED

## 2023-09-01 LAB — CREATININE, URINE, RANDOM: Creatinine, Urine: 92 mg/dL

## 2023-09-01 LAB — HIV-1 RNA QUANT-NO REFLEX-BLD
HIV 1 RNA Quant: 139000 {copies}/mL
LOG10 HIV-1 RNA: 5.143 {Log_copies}/mL

## 2023-09-01 MED ORDER — ENOXAPARIN SODIUM 40 MG/0.4ML IJ SOSY
40.0000 mg | PREFILLED_SYRINGE | INTRAMUSCULAR | Status: DC
  Filled 2023-09-01: qty 0.4

## 2023-09-01 MED ORDER — SODIUM CHLORIDE 3 % IN NEBU
4.0000 mL | INHALATION_SOLUTION | Freq: Two times a day (BID) | RESPIRATORY_TRACT | Status: DC
  Administered 2023-09-01 – 2023-09-03 (×3): 4 mL via RESPIRATORY_TRACT
  Filled 2023-09-01 (×5): qty 4

## 2023-09-01 NOTE — Progress Notes (Signed)
 At bedside for PICC insertion. Explained risks/benefits for PICC, however, pt refuses placement at this time. Educated pt on the inability to obtain lab work. Pt continues refusal. Consent signed and RN made aware. Currently, pt has 1 functioning PIV at this time.

## 2023-09-01 NOTE — Plan of Care (Signed)
  Problem: Education: Goal: Knowledge of General Education information will improve Description: Including pain rating scale, medication(s)/side effects and non-pharmacologic comfort measures Outcome: Progressing   Problem: Health Behavior/Discharge Planning: Goal: Ability to manage health-related needs will improve Outcome: Progressing   Problem: Clinical Measurements: Goal: Respiratory complications will improve Outcome: Progressing   Problem: Activity: Goal: Risk for activity intolerance will decrease Outcome: Progressing   Problem: Pain Managment: Goal: General experience of comfort will improve and/or be controlled Outcome: Progressing   Problem: Safety: Goal: Ability to remain free from injury will improve Outcome: Progressing

## 2023-09-01 NOTE — Progress Notes (Signed)
 NAME:  Frances Ball, MRN:  [id number], DOB:  03-09-1990, LOS: 2 ADMISSION DATE:  08/30/2023, CONSULTATION DATE:  08/31/23 REFERRING MD: Tobie READ CHIEF COMPLAINT: Pulm consult for pneumonia versus other  History of Present Illness:  33 year old female with past medical history of IV drug use, factor V Leiden without history of blood clot, HIV/AIDS presented to the emergency department on 7/28 with complaint of shortness of breath.  She was evaluated in the outpatient setting for cough and was placed on cefdinir .  States that she has been taking this for the past 10 days but continues to have no improvement in her symptoms.  Has had suboptimal compliance on Biktarvy  and has not been taking HIV medication for 6 months.  She has transportation issues and has not been seen in the ID clinic for a long time, also reports issues with homelessness, just could not go to get my prescriptions filled  in the ED had temp of 99.7, tachycardic.  She had CTA performed which was negative for PE but did show dense consolidation in the left lower lobe with airway impaction, infiltrate in the lingula and right lower lobe.  No leukocytosis.  She was admitted to hospitalist service.  She was broadened and started on cefepime , doxycycline , azithromycin.  7/20 and hospitalist involved infectious disease.  Most recent CD4 count 166.  They have placed ID workup.  PCCM consulted for evaluation of CT chest imaging and opinion on if this is pneumonia versus other.  She reports green sputum that smells and taste nasty She reports a diagnosis of bronchiectasis in 2020 , chart review shows seen by Dr. Darlean Spirometry attempted but not good effort  Pertinent  Medical History  IV drug use, factor V Leiden, HIV/AIDS  Significant Hospital Events: Including procedures, antibiotic start and stop dates in addition to other pertinent events   7/28: ED presentation for pneumonia with failed outpatient treatment.  Admitted and placed  on cefepime , Doxy, azithromycin.  Blood cultures obtained before antibiotics started. 7/29: ID involvement given AIDS status.  Pulmonary consult for assistance with chest imaging and management.  MRI brain negative  Interim History / Subjective:  Afebrile On room air Complains of cough yellow sputum  Objective   Blood pressure 93/68, pulse 100, temperature 98.4 F (36.9 C), temperature source Oral, resp. rate 16, height 5' 2 (1.575 m), weight 45.3 kg, SpO2 96%.        Intake/Output Summary (Last 24 hours) at 09/01/2023 1410 Last data filed at 09/01/2023 1300 Gross per 24 hour  Intake 1102.26 ml  Output --  Net 1102.26 ml   Filed Weights   08/30/23 1700 08/31/23 1224 09/01/23 0506  Weight: 47.2 kg 46.3 kg 45.3 kg    Examination: General: Thin woman lying in bed no distress HENT: Mild pallor, no icterus no lymphadenopathy Lungs: No accessory muscle use, clear Cardiovascular: S1-S2 regular Abdomen: Soft, nontender, scaphoid Extremities: No edema, no deformity Neuro: Alert, interactive, nonfocal   Labs show no leukocytosis, normal hemoglobin 10.1, normal LFTs, albumin  2.6 Review of prior imaging : CT abdomen/pelvis 12/31/2021 shows left bronchiectasis CTA chest 01/2022 shows patchy airspace disease throughout CT chest/abdomen/pelvis 06/2021 right middle and bilateral lower lobe pneumonia  Resolved Hospital Problem list    Assessment & Plan:   Left lower lobe bronchiectasis Multifocal pneumonia in the setting of immunocompromise status and failed outpatient antibiotics 2 weeks of symptoms with progressive dyspnea.  Initially treated with 10 days of cefdinir .  CT chest on admission with dense consolidation  in the left lower lobe, infiltrate in the lingula and right lower lobe.  Placed on cefepime , Doxy, azithromycin.  Infectious disease involved. Strep pneumonia and negative Respiratory 20 pathogen panel negative MRSA PCR negative Cryptococcal antigen negative Recurrent  pneumonias explained by bronchiectasis, await CD4 testing Low procalcitonin reassuring  - Concern here would be for Pseudomonas, given poor response to cefdinir  especially in the setting of bronchiectasis, would broaden to cefepime .  Doubt bronchoscopy necessary here but will observe response to treatment.  I am not concerned about tuberculosis here given lobar consolidation Hopefully sputum culture will be diagnostic  - Airway clearance is important with Flutter valve, saline nebs   Below per primary HIV/AIDS Chronic hep C, treated by ID - ID involved, appreciate assistance in management Factor V Leiden  PCCM will follow peripherally  Best Practice (right click and Reselect all SmartList Selections daily)   Code Status:  full code Last date of multidisciplinary goals of care discussion [NA]  Labs   CBC: Recent Labs  Lab 08/30/23 1805 08/30/23 1814 08/31/23 0250  WBC 8.8  --  4.1  NEUTROABS 7.1  --  3.1  HGB 10.6* 11.9* 10.1*  HCT 33.5* 35.0* 31.9*  MCV 82.3  --  83.3  PLT 337  --  292    Basic Metabolic Panel: Recent Labs  Lab 08/30/23 1805 08/30/23 1808 08/30/23 1814 08/31/23 0250  NA 132*  --  133* 138  K 3.5  --  3.5 3.8  CL 94*  --  95* 101  CO2 21*  --   --  23  GLUCOSE 82  --  84 84  BUN <5*  --  3* <5*  CREATININE 1.00  --  0.70 0.88  CALCIUM 8.5*  --   --  8.2*  MG  --  2.0  --  1.9  PHOS  --   --   --  2.7   GFR: Estimated Creatinine Clearance: 64.4 mL/min (by C-G formula based on SCr of 0.88 mg/dL). Recent Labs  Lab 08/30/23 1805 08/30/23 1808 08/30/23 1814 08/31/23 0250  PROCALCITON  --  <0.10  --   --   WBC 8.8  --   --  4.1  LATICACIDVEN  --   --  0.7  --     Liver Function Tests: Recent Labs  Lab 08/30/23 1805 08/31/23 0250  AST 16 13*  ALT 7 7  ALKPHOS 39 31*  BILITOT 1.3* 0.8  PROT 7.3 6.3*  ALBUMIN  3.0* 2.6*   No results for input(s): LIPASE, AMYLASE in the last 168 hours. No results for input(s): AMMONIA in  the last 168 hours.  ABG    Component Value Date/Time   HCO3 26.0 06/24/2021 2105   TCO2 21 (L) 08/30/2023 1814   O2SAT 85.6 06/24/2021 2105     Coagulation Profile: Recent Labs  Lab 08/30/23 1805  INR 1.1    Cardiac Enzymes: No results for input(s): CKTOTAL, CKMB, CKMBINDEX, TROPONINI in the last 168 hours.  HbA1C: No results found for: HGBA1C  CBG: No results for input(s): GLUCAP in the last 168 hours.     Harden Staff MD. [facility] Pager : [phone]  If no response to pager , please call [phone] until 7 pm After 7:00 pm call Elink  [phone]   09/01/2023

## 2023-09-01 NOTE — Progress Notes (Addendum)
 [facility] PROGRESS NOTE   Frances Ball FMW:[id number] DOB: April 15, 1990 DOA: 08/30/2023  PCP: Pcp, No  Brief History: Patient with PMH of HIV noncompliant with medicines, chronic bronchiectasis, anemia, hep C, failure to thrive presented to the hospital with complaints of cough shortness of breath chest pain as well as weight loss.  Thought to have pneumonia.  Hospitalized for further management.  Consultants: Infectious disease.  Pulmonology.  Procedures: None yet    Subjective/Interval History: Continues to have a cough with expectoration.  Denies any worsening symptoms.  Shortness of breath has improved.  Left-sided chest pain is about the same.    Assessment/Plan:  Recurrent pneumonia. Chronic bronchiectasis. Presented from nursing cough and shortness of breath as well as left-sided chest pain. Symptoms have been ongoing for last few months. Was treated for pneumonia in April, early July. CT scan shows unresolving pneumonia. Patient reports weight loss as well as fever with a temp going up to 104 few days ago. ID was consulted.  Pulmonology also consulted. Patient currently on cefepime  and doxycycline .  Sputum cultures pending.  AFB evaluation pending.  QuantiFERON pending.  Unintentional weight loss. Failure to thrive. In the setting of infection. Monitor for now. Dietitian consulted.   Hyponatremia. Resolved   Anion gap acidosis. Improvement noted.   Chronic hypotension. Systolic BP in 90s. Improving gentle IV hydration.  Also on midodrine .  Patient is chronically on this medication.   Headache. Right brain was unremarkable.  Normocytic anemia No evidence of overt bleeding.  Continue to monitor.   History of HIV and AIDS. ID is following.  Patient currently not on any antiretroviral medications.     Chronic pain syndrome. Home regimen includes Suboxone /buprenorphine  8 mg tablet 3 times daily.  Prescriber database was reviewed.  Suboxone  was  last prescribed on 08/10/2023.  90 tablets were prescribed at that time.   DVT Prophylaxis: Initiate Lovenox  Code Status: Full code Family Communication: Discussed with patient Disposition Plan: Hopefully return home when improved    Medications: Scheduled:  acetylcysteine   3 mL Nebulization BID   buprenorphine   8 mg Sublingual TID   doxycycline   100 mg Oral Q12H   guaiFENesin   600 mg Oral BID   midodrine   2.5 mg Oral TID WC   Continuous:  ceFEPime  (MAXIPIME ) IV 2 g (09/01/23 0838)   PRN:acetaminophen  **OR** acetaminophen , albuterol , benzonatate , melatonin, ondansetron  (ZOFRAN ) IV **OR** ondansetron   Antibiotics: Anti-infectives (From admission, onward)    Start     Dose/Rate Route Frequency Ordered Stop   08/31/23 2300  ceFEPIme  (MAXIPIME ) 2 g in sodium chloride  0.9 % 100 mL IVPB        2 g 200 mL/hr over 30 Minutes Intravenous Every 8 hours 08/31/23 1801     08/31/23 2200  doxycycline  (VIBRA -TABS) tablet 100 mg        100 mg Oral Every 12 hours 08/31/23 1440     08/31/23 1830  ceFEPIme  (MAXIPIME ) 2 g in sodium chloride  0.9 % 100 mL IVPB  Status:  Discontinued        2 g 200 mL/hr over 30 Minutes Intravenous Every 8 hours 08/31/23 1741 08/31/23 1801   08/31/23 1530  amoxicillin -clavulanate (AUGMENTIN ) 875-125 MG per tablet 1 tablet  Status:  Discontinued        1 tablet Oral Every 12 hours 08/31/23 1440 08/31/23 1738   08/31/23 0945  bictegravir-emtricitabine -tenofovir  AF (BIKTARVY ) 50-200-25 MG per tablet 1 tablet  Status:  Discontinued        1 tablet Oral  Daily at bedtime 08/31/23 0945 08/31/23 1419   08/31/23 0800  doxycycline  (VIBRAMYCIN ) 100 mg in sodium chloride  0.9 % 250 mL IVPB  Status:  Discontinued        100 mg 125 mL/hr over 120 Minutes Intravenous Every 12 hours 08/30/23 2107 08/31/23 1440   08/30/23 2115  ceFEPIme  (MAXIPIME ) 2 g in sodium chloride  0.9 % 100 mL IVPB  Status:  Discontinued        2 g 200 mL/hr over 30 Minutes Intravenous Every 8 hours 08/30/23  2110 08/31/23 1440   08/30/23 2100  bictegravir-emtricitabine -tenofovir  AF (BIKTARVY ) 50-200-25 MG per tablet 1 tablet  Status:  Discontinued        1 tablet Oral Daily 08/30/23 2052 08/31/23 0945   08/30/23 2045  doxycycline  (VIBRAMYCIN ) 100 mg in sodium chloride  0.9 % 250 mL IVPB        100 mg 125 mL/hr over 120 Minutes Intravenous  Once 08/30/23 2032 08/30/23 2335   08/30/23 1830  cefTRIAXone  (ROCEPHIN ) 2 g in sodium chloride  0.9 % 100 mL IVPB        2 g 200 mL/hr over 30 Minutes Intravenous Once 08/30/23 1823 08/30/23 1917       Objective:  Vital Signs  Vitals:   09/01/23 0737 09/01/23 0806 09/01/23 1018 09/01/23 1107  BP: 94/67  96/61 93/68  Pulse: 90 86 (!) 107 100  Resp: 17 18 16 16   Temp: 98.5 F (36.9 C)  98 F (36.7 C) 98.4 F (36.9 C)  TempSrc: Oral  Oral Oral  SpO2: 97% 97% 98% 96%  Weight:      Height:        Intake/Output Summary (Last 24 hours) at 09/01/2023 1204 Last data filed at 09/01/2023 0300 Gross per 24 hour  Intake 982.26 ml  Output --  Net 982.26 ml   Filed Weights   08/30/23 1700 08/31/23 1224 09/01/23 0506  Weight: 47.2 kg 46.3 kg 45.3 kg    General appearance: Awake alert.  In no distress Resp: Coarse breath sounds bilateral bases with crackles. Cardio: S1-S2 is normal regular.  No S3-S4.  No rubs murmurs or bruit GI: Abdomen is soft.  Nontender nondistended.  Bowel sounds are present normal.  No masses organomegaly Extremities: No edema.  Full range of motion of lower extremities. Neurologic: Alert and oriented x3.  No focal neurological deficits.    Lab Results:  Data Reviewed: I have personally reviewed following labs and reports of the imaging studies  CBC: Recent Labs  Lab 08/30/23 1805 08/30/23 1814 08/31/23 0250  WBC 8.8  --  4.1  NEUTROABS 7.1  --  3.1  HGB 10.6* 11.9* 10.1*  HCT 33.5* 35.0* 31.9*  MCV 82.3  --  83.3  PLT 337  --  292    Basic Metabolic Panel: Recent Labs  Lab 08/30/23 1805 08/30/23 1808  08/30/23 1814 08/31/23 0250  NA 132*  --  133* 138  K 3.5  --  3.5 3.8  CL 94*  --  95* 101  CO2 21*  --   --  23  GLUCOSE 82  --  84 84  BUN <5*  --  3* <5*  CREATININE 1.00  --  0.70 0.88  CALCIUM 8.5*  --   --  8.2*  MG  --  2.0  --  1.9  PHOS  --   --   --  2.7    GFR: Estimated Creatinine Clearance: 64.4 mL/min (by C-G formula based on SCr of  0.88 mg/dL).  Liver Function Tests: Recent Labs  Lab 08/30/23 1805 08/31/23 0250  AST 16 13*  ALT 7 7  ALKPHOS 39 31*  BILITOT 1.3* 0.8  PROT 7.3 6.3*  ALBUMIN  3.0* 2.6*    Coagulation Profile: Recent Labs  Lab 08/30/23 1805  INR 1.1    Thyroid  Function Tests: Recent Labs    08/31/23 0250  TSH 0.710     Recent Results (from the past 240 hours)  Resp panel by RT-PCR (RSV, Flu A&B, Covid) Anterior Nasal Swab     Status: None   Collection Time: 08/30/23  4:20 PM   Specimen: Anterior Nasal Swab  Result Value Ref Range Status   SARS Coronavirus 2 by RT PCR NEGATIVE NEGATIVE Final   Influenza A by PCR NEGATIVE NEGATIVE Final   Influenza B by PCR NEGATIVE NEGATIVE Final    Comment: (NOTE) The Xpert Xpress SARS-CoV-2/FLU/RSV plus assay is intended as an aid in the diagnosis of influenza from Nasopharyngeal swab specimens and should not be used as a sole basis for treatment. Nasal washings and aspirates are unacceptable for Xpert Xpress SARS-CoV-2/FLU/RSV testing.  Fact Sheet for Patients: BloggerCourse.com  Fact Sheet for Healthcare Providers: SeriousBroker.it  This test is not yet approved or cleared by the [place]  FDA and has been authorized for detection and/or diagnosis of SARS-CoV-2 by FDA under an Emergency Use Authorization (EUA). This EUA will remain in effect (meaning this test can be used) for the duration of the COVID-19 declaration under Section 564(b)(1) of the Act, 21 U.S.C. section 360bbb-3(b)(1), unless the authorization is terminated  or revoked.     Resp Syncytial Virus by PCR NEGATIVE NEGATIVE Final    Comment: (NOTE) Fact Sheet for Patients: BloggerCourse.com  Fact Sheet for Healthcare Providers: SeriousBroker.it  This test is not yet approved or cleared by the [place]  FDA and has been authorized for detection and/or diagnosis of SARS-CoV-2 by FDA under an Emergency Use Authorization (EUA). This EUA will remain in effect (meaning this test can be used) for the duration of the COVID-19 declaration under Section 564(b)(1) of the Act, 21 U.S.C. section 360bbb-3(b)(1), unless the authorization is terminated or revoked.  Performed at [facility], [address], [place], [place] [address]   Blood Culture (routine x 2)     Status: None (Preliminary result)   Collection Time: 08/30/23  4:25 PM   Specimen: BLOOD LEFT ARM  Result Value Ref Range Status   Specimen Description BLOOD LEFT ARM  Final   Special Requests   Final    BOTTLES DRAWN AEROBIC AND ANAEROBIC Blood Culture results may not be optimal due to an inadequate volume of blood received in culture bottles   Culture   Final    NO GROWTH < 24 HOURS Performed at [facility], [address] [place], [place] [address]    Report Status PENDING  Incomplete  Blood Culture (routine x 2)     Status: None (Preliminary result)   Collection Time: 08/30/23  6:05 PM   Specimen: BLOOD LEFT ARM  Result Value Ref Range Status   Specimen Description BLOOD LEFT ARM  Final   Special Requests   Final    BOTTLES DRAWN AEROBIC AND ANAEROBIC Blood Culture adequate volume   Culture   Final    NO GROWTH < 24 HOURS Performed at [facility], [address], [place], [place] [address]    Report Status PENDING  Incomplete  Respiratory (~20 pathogens) panel  by PCR     Status: None   Collection Time: 08/31/23  8:43 AM   Specimen: Nasopharyngeal Swab; Respiratory  Result Value Ref Range Status    Adenovirus NOT DETECTED NOT DETECTED Final   Coronavirus 229E NOT DETECTED NOT DETECTED Final    Comment: (NOTE) The Coronavirus on the Respiratory Panel, DOES NOT test for the novel  Coronavirus (2019 nCoV)    Coronavirus HKU1 NOT DETECTED NOT DETECTED Final   Coronavirus NL63 NOT DETECTED NOT DETECTED Final   Coronavirus OC43 NOT DETECTED NOT DETECTED Final   Metapneumovirus NOT DETECTED NOT DETECTED Final   Rhinovirus / Enterovirus NOT DETECTED NOT DETECTED Final   Influenza A NOT DETECTED NOT DETECTED Final   Influenza B NOT DETECTED NOT DETECTED Final   Parainfluenza Virus 1 NOT DETECTED NOT DETECTED Final   Parainfluenza Virus 2 NOT DETECTED NOT DETECTED Final   Parainfluenza Virus 3 NOT DETECTED NOT DETECTED Final   Parainfluenza Virus 4 NOT DETECTED NOT DETECTED Final   Respiratory Syncytial Virus NOT DETECTED NOT DETECTED Final   Bordetella pertussis NOT DETECTED NOT DETECTED Final   Bordetella Parapertussis NOT DETECTED NOT DETECTED Final   Chlamydophila pneumoniae NOT DETECTED NOT DETECTED Final   Mycoplasma pneumoniae NOT DETECTED NOT DETECTED Final    Comment: Performed at [facility], [address] [place], [place] [address]  MRSA Next Gen by PCR, Nasal     Status: None   Collection Time: 08/31/23 11:27 AM  Result Value Ref Range Status   MRSA by PCR Next Gen NOT DETECTED NOT DETECTED Final    Comment: (NOTE) The GeneXpert MRSA Assay (FDA approved for NASAL specimens only), is one component of a comprehensive MRSA colonization surveillance program. It is not intended to diagnose MRSA infection nor to guide or monitor treatment for MRSA infections. Test performance is not FDA approved in patients less than 28 years old. Performed at [facility], [address] [place], [place] [address]       Radiology Studies: MR BRAIN W WO CONTRAST Result Date: 08/31/2023 CLINICAL DATA:  aids; headache EXAM: MRI HEAD WITHOUT AND WITH CONTRAST TECHNIQUE:  Multiplanar, multiecho pulse sequences of the brain and surrounding structures were obtained without and with intravenous contrast. CONTRAST:  4mL GADAVIST  GADOBUTROL  1 MMOL/ML IV SOLN COMPARISON:  MRI of the head dated July 28, 2017. FINDINGS: Brain: There is mild cerebral volume loss present. The brain is unremarkable. There is no evidence of hemorrhage, mass, cortical infarct or hydrocephalus. There is no abnormal parenchymal or meningeal enhancement. Vascular: Normal flow voids. Skull and upper cervical spine: No osseous lesions. Sinuses/Orbits: Moderate opacification of the maxillary sinuses. There is also a ethmoid air cell disease and mild mucosal disease within the sphenoid sinuses. The orbits are unremarkable. Other: None. IMPRESSION: 1. Normal brain. 2. Mild-to-moderate paranasal sinus disease. Electronically Signed   By: Evalene Coho M.D.   On: 08/31/2023 17:02   CT Angio Chest PE W and/or Wo Contrast Result Date: 08/30/2023 CLINICAL DATA:  Tachycardia, dyspnea, factor 5 Leiden hypercoagulability EXAM: CT ANGIOGRAPHY CHEST WITH CONTRAST TECHNIQUE: Multidetector CT imaging of the chest was performed using the standard protocol during bolus administration of intravenous contrast. Multiplanar CT image reconstructions and MIPs were obtained to evaluate the vascular anatomy. RADIATION DOSE REDUCTION: This exam was performed according to the departmental dose-optimization program which includes automated exposure control, adjustment of the mA and/or kV according to patient size and/or use of iterative reconstruction technique. CONTRAST:  75mL OMNIPAQUE   IOHEXOL  350 MG/ML SOLN COMPARISON:  None Available. FINDINGS: Cardiovascular: Adequate opacification of the pulmonary arterial tree. No intraluminal filling defect identified to suggest acute pulmonary embolism. Central pulmonary arteries are of normal caliber. No significant coronary artery calcification. Cardiac size is within normal limits. No  pericardial effusion. No signet atherosclerotic calcification within the thoracic aorta. No aortic aneurysm. Mediastinum/Nodes: Shotty left hilar and aortopulmonary window lymphadenopathy is likely reactive in nature. No frankly pathologic thoracic adenopathy. Visualized thyroid  is unremarkable. Esophagus is unremarkable. Lungs/Pleura: There is dense consolidation of the left lower lobe with extensive airway impaction. Scatter nodular airspace infiltrate within the lingula and to a lesser extent right lower lobe most in keeping with changes acute infection in the appropriate clinical setting. No pneumothorax or pleural effusion. Upper Abdomen: No acute abnormality. Musculoskeletal: No chest wall abnormality. No acute or significant osseous findings. Review of the MIP images confirms the above findings. IMPRESSION: 1. No evidence of acute pulmonary embolism. 2. Dense consolidation of the left lower lobe with extensive airway impaction. Scatter nodular airspace infiltrate within the lingula and to a lesser extent right lower lobe most in keeping with changes acute infection in the appropriate clinical setting. Electronically Signed   By: Dorethia Molt M.D.   On: 08/30/2023 19:53   DG Chest Port 1 View Result Date: 08/30/2023 CLINICAL DATA:  Question of sepsis to evaluate for abnormality. Shortness of breath and dizziness ongoing. Recent diagnosis of pneumonia with outpatient antibiotic treatment without relief. EXAM: PORTABLE CHEST 1 VIEW COMPARISON:  08/18/2023 FINDINGS: Heart size and pulmonary vascularity are normal. Emphysematous changes in the lungs. There is volume loss and consolidation in the left lower lung, new since prior study. This likely represents pneumonia given the clinical symptoms. Follow-up to resolution is recommended to exclude underlying obstructing neoplasm. Right lung is clear. No pleural effusion. No pneumothorax. Mediastinal contours appear intact. IMPRESSION: Left lower lung volume loss  and consolidation likely pneumonia. Follow-up to resolution is recommended to exclude underlying obstructing neoplasm. Electronically Signed   By: Elsie Gravely M.D.   On: 08/30/2023 16:58       LOS: 2 days   Dannika Hilgeman Verdene  [facility] Pager on [email or web address]  09/01/2023, 12:04 PM

## 2023-09-01 NOTE — Progress Notes (Signed)
 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, admitted for several weeks of progressive dyspnea, not responding to pneumonia treatment in setting of LLL dense airspace consolidation and 20 pound weight loss     Micro -- Resp viral pcr negative Crypto ag negative Blasto, histo in progress  7/30 mtb pcr #1 ip 7/30  mtb pcr #2 ip 7/30 sputum cx ip 7/30 AFB expectorated sputum ip  7/29 quantiferon gold ip 7/29 mrsa nares pcr negative 7/28 blood cx ngtd    #pna #weight loss -she was on biktarvy  until a few months ago and was taking it with good compliance and so the weight loss is concerning as not likely from hiv standpoint -ddx tb, endemic fungi. Rhodococcus/nocardia is less likely. Non-id cause include malignancy -she can produce sputum and will get afb cx -discussed with primary regarding non-id cause and team will discuss with pulmonology -would treat empirically for cap with perhaps a longer course while r/o tb -admission bcx ngtd and resp viral pcr negative   -pulm consulted and would like to cover empirically for pseudomonas as thinking she could have bronchiectasis flare      #hiv/aids #oi prophy Treatment experienced multiple regimen in the past including atripla, combivir /kaletra, isentress/prezista /norvir/viread and fuzeon, genovya (unsuppressed), biktarvy  pluse evotaz  then biktarvy  alone since 2020 Took biktarvy  with good compliance but hasn't for the past few months.  Sees rcid Corean Fireman LOV 08/2022 Recent Labs       Lab Results  Component Value Date    HIV1RNAQUANT Not Detected 08/28/2022      Cd4 166 @ 08/2022 (13%) Brain mri unremarkable and crypto ag negative Start biktarvy  7/31  Do not suspect pjp and will her on bactrim  prophy eventually -- pending respiratory cx. I do not suspect nocardia           #headache Left sided subacute. 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 Repeat hepatitis screening     #hcm sent rpr, urine gc/chlam and repeat hepatitis panel     Plan: Once all 3 sputum AFB cx are sent can likely be discharge (will notitfy dhs to follow this) Abx adjustment pending regular sputum cx Plan to give 3 weeks typical bacterial pna treatment but only 1 week doxy Start biktarvy  Continue cefepime  and doxy Maintain negative airborne isolation Discussed with triad      Principal Problem:   CAP (community acquired pneumonia) Active Problems:   HIV (human immunodeficiency virus infection) (HCC)   Acute hyponatremia   SOB (shortness of breath)   Anemia of chronic disease   High anion gap metabolic acidosis   Chronic pain syndrome   Allergies  Allergen Reactions   Levaquin [Levofloxacin In D5w] Shortness Of Breath and Rash  Zithromax [Azithromycin] Shortness Of Breath, Swelling and Other (See Comments)    Pt reports tightness in skin with redness    Bactrim [Sulfamethoxazole-Trimethoprim] Swelling and Other (See Comments)    Pt reports that she was placed on Bactrim for pneumonia when she was d/c from [facility] and developed a red face with swelling and can not tolerate Bactrim.    Sulfa Antibiotics Swelling    Facial swelling and burning   Vancomycin  Other (See Comments)    Intolerance, pt states that as long as med is administered with Benadryl  its tolarable   Darunavir  Rash    Scheduled Meds:  buprenorphine   8 mg Sublingual TID   doxycycline   100 mg Oral Q12H   enoxaparin  (LOVENOX ) injection  40 mg Subcutaneous Q24H    guaiFENesin   600 mg Oral BID   midodrine   2.5 mg Oral TID WC   sodium chloride  HYPERTONIC  4 mL Nebulization BID   Continuous Infusions:  ceFEPime  (MAXIPIME ) IV 2 g (09/01/23 2220)   PRN Meds:.acetaminophen  **OR** acetaminophen , albuterol , benzonatate , melatonin, ondansetron  (ZOFRAN ) IV **OR** ondansetron    SUBJECTIVE: No complaint   Review of Systems: ROS All other ROS was negative, except mentioned above     OBJECTIVE: Vitals:   09/01/23 1107 09/01/23 1554 09/01/23 1942 09/01/23 2348  BP: 93/68 97/72 99/70  99/72  Pulse: 100 98 87 78  Resp: 16 16 17    Temp: 98.4 F (36.9 C) 98.7 F (37.1 C) 98.5 F (36.9 C) 97.8 F (36.6 C)  TempSrc: Oral Oral Oral Oral  SpO2: 96%  96% 96%  Weight:      Height:       Body mass index is 18.25 kg/m.  Physical Exam General/constitutional: no distress, pleasant HEENT: Normocephalic, PER, Conj Clear, EOMI, Oropharynx clear Neck supple CV: rrr no mrg Lungs: normal respiratory effort Abd: Soft, Nontender Ext: no edema Skin: No Rash Neuro: nonfocal MSK: no peripheral joint swelling/tenderness/warmth; back spines nontender     Lab Results Lab Results  Component Value Date   WBC 4.1 08/31/2023   HGB 10.1 (L) 08/31/2023   HCT 31.9 (L) 08/31/2023   MCV 83.3 08/31/2023   PLT 292 08/31/2023    Lab Results  Component Value Date   CREATININE 0.88 08/31/2023   BUN <5 (L) 08/31/2023   NA 138 08/31/2023   K 3.8 08/31/2023   CL 101 08/31/2023   CO2 23 08/31/2023    Lab Results  Component Value Date   ALT 7 08/31/2023   AST 13 (L) 08/31/2023   GGT 12 10/07/2017   ALKPHOS 31 (L) 08/31/2023   BILITOT 0.8 08/31/2023      Microbiology: Recent Results (from the past 240 hours)  Resp panel by RT-PCR (RSV, Flu A&B, Covid) Anterior Nasal Swab     Status: None   Collection Time: 08/30/23  4:20 PM   Specimen: Anterior Nasal Swab  Result Value Ref Range Status   SARS Coronavirus 2 by RT PCR NEGATIVE NEGATIVE Final    Influenza A by PCR NEGATIVE NEGATIVE Final   Influenza B by PCR NEGATIVE NEGATIVE Final    Comment: (NOTE) The Xpert Xpress SARS-CoV-2/FLU/RSV plus assay is intended as an aid in the diagnosis of influenza from Nasopharyngeal swab specimens and should not be used as a sole basis for treatment. Nasal washings and aspirates are unacceptable for Xpert Xpress SARS-CoV-2/FLU/RSV testing.  Fact Sheet for Patients: BloggerCourse.com  Fact Sheet for Healthcare Providers: SeriousBroker.it  This test is not yet approved or cleared by the  [place]  FDA and has been authorized for detection and/or diagnosis of SARS-CoV-2 by FDA under an Emergency Use Authorization (EUA). This EUA will remain in effect (meaning this test can be used) for the duration of the COVID-19 declaration under Section 564(b)(1) of the Act, 21 U.S.C. section 360bbb-3(b)(1), unless the authorization is terminated or revoked.     Resp Syncytial Virus by PCR NEGATIVE NEGATIVE Final    Comment: (NOTE) Fact Sheet for Patients: BloggerCourse.com  Fact Sheet for Healthcare Providers: SeriousBroker.it  This test is not yet approved or cleared by the [place]  FDA and has been authorized for detection and/or diagnosis of SARS-CoV-2 by FDA under an Emergency Use Authorization (EUA). This EUA will remain in effect (meaning this test can be used) for the duration of the COVID-19 declaration under Section 564(b)(1) of the Act, 21 U.S.C. section 360bbb-3(b)(1), unless the authorization is terminated or revoked.  Performed at [facility], [address], [place], [place] [address]   Blood Culture (routine x 2)     Status: None (Preliminary result)   Collection Time: 08/30/23  4:25 PM   Specimen: BLOOD LEFT ARM  Result Value Ref Range Status   Specimen Description BLOOD LEFT ARM  Final   Special Requests   Final     BOTTLES DRAWN AEROBIC AND ANAEROBIC Blood Culture results may not be optimal due to an inadequate volume of blood received in culture bottles   Culture   Final    NO GROWTH 2 DAYS Performed at [facility], [address], [place], [place] [address]    Report Status PENDING  Incomplete  Blood Culture (routine x 2)     Status: None (Preliminary result)   Collection Time: 08/30/23  6:05 PM   Specimen: BLOOD LEFT ARM  Result Value Ref Range Status   Specimen Description BLOOD LEFT ARM  Final   Special Requests   Final    BOTTLES DRAWN AEROBIC AND ANAEROBIC Blood Culture adequate volume   Culture   Final    NO GROWTH 2 DAYS Performed at [facility], [address], [place], [place] [address]    Report Status PENDING  Incomplete  Respiratory (~20 pathogens) panel by PCR     Status: None   Collection Time: 08/31/23  8:43 AM   Specimen: Nasopharyngeal Swab; Respiratory  Result Value Ref Range Status   Adenovirus NOT DETECTED NOT DETECTED Final   Coronavirus 229E NOT DETECTED NOT DETECTED Final    Comment: (NOTE) The Coronavirus on the Respiratory Panel, DOES NOT test for the novel  Coronavirus (2019 nCoV)    Coronavirus HKU1 NOT DETECTED NOT DETECTED Final   Coronavirus NL63 NOT DETECTED NOT DETECTED Final   Coronavirus OC43 NOT DETECTED NOT DETECTED Final   Metapneumovirus NOT DETECTED NOT DETECTED Final   Rhinovirus / Enterovirus NOT DETECTED NOT DETECTED Final   Influenza A NOT DETECTED NOT DETECTED Final   Influenza B NOT DETECTED NOT DETECTED Final   Parainfluenza Virus 1 NOT DETECTED NOT DETECTED Final   Parainfluenza Virus 2 NOT DETECTED NOT DETECTED Final   Parainfluenza Virus 3 NOT DETECTED NOT DETECTED Final   Parainfluenza Virus 4 NOT DETECTED NOT DETECTED Final   Respiratory Syncytial Virus NOT DETECTED NOT DETECTED Final   Bordetella pertussis NOT DETECTED NOT DETECTED Final   Bordetella Parapertussis NOT DETECTED NOT DETECTED Final   Chlamydophila  pneumoniae NOT DETECTED NOT DETECTED Final   Mycoplasma pneumoniae NOT DETECTED NOT DETECTED Final  Comment: Performed at [facility], [address] [place], [place] [address]  MRSA Next Gen by PCR, Nasal     Status: None   Collection Time: 08/31/23 11:27 AM  Result Value Ref Range Status   MRSA by PCR Next Gen NOT DETECTED NOT DETECTED Final    Comment: (NOTE) The GeneXpert MRSA Assay (FDA approved for NASAL specimens only), is one component of a comprehensive MRSA colonization surveillance program. It is not intended to diagnose MRSA infection nor to guide or monitor treatment for MRSA infections. Test performance is not FDA approved in patients less than 28 years old. Performed at [facility], [address] [place], [place] [address]   Expectorated Sputum Assessment w Gram Stain, Rflx to Resp Cult     Status: None   Collection Time: 09/01/23  8:45 AM   Specimen: Expectorated Sputum  Result Value Ref Range Status   Specimen Description EXPECTORATED SPUTUM  Final   Special Requests NONE  Final   Sputum evaluation   Final    THIS SPECIMEN IS ACCEPTABLE FOR SPUTUM CULTURE Performed at [facility], [address] [place], [place] [address]    Report Status 09/01/2023 FINAL  Final  Culture, Respiratory w Gram Stain     Status: None (Preliminary result)   Collection Time: 09/01/23  8:45 AM  Result Value Ref Range Status   Specimen Description EXPECTORATED SPUTUM  Final   Special Requests NONE Reflexed from [id number]  Final   Gram Stain   Final    FEW WBC PRESENT, PREDOMINANTLY PMN MODERATE GRAM NEGATIVE RODS RARE GRAM POSITIVE COCCI IN CHAINS Performed at [facility], [address] [place], [place] [address]    Culture PENDING  Incomplete   Report Status PENDING  Incomplete     Serology:   Imaging: If present, new imagings (plain films, ct scans, and mri) have been personally visualized and interpreted; radiology reports have been reviewed.  Decision making incorporated into the Impression / Recommendations.  7/29 brain mri No acute abnormality Mild-mod parasinus disease  7/29 chest ct IMPRESSION: 1. No evidence of acute pulmonary embolism. 2. Dense consolidation of the left lower lobe with extensive airway impaction. Scatter nodular airspace infiltrate within the lingula and to a lesser extent right lower lobe most in keeping with changes acute infection in the appropriate clinical setting.  Constance ONEIDA Passer, MD [facility] [phone] pager    09/01/2023, 11:54 PM

## 2023-09-01 NOTE — Plan of Care (Signed)

## 2023-09-01 NOTE — TOC Initial Note (Signed)
 Transition of Care [facility]) - Initial/Assessment Note    Patient Details  Name: Frances Ball MRN: [id number] Date of Birth: Dec 05, 1990  [facility] CM/SW Contact:    Roxie KANDICE Stain, RN Phone Number: 09/01/2023, 3:11 PM  Clinical Narrative:                 Spoke to patient regarding transition needs.  Patient lives with Mother and is followed by Dr. Dixox for PCP.  TOC will continue to follow for needs.  Expected Discharge Plan: Home/Self Care Barriers to Discharge: Continued Medical Work up   Patient Goals and CMS Choice Patient states their goals for this hospitalization and ongoing recovery are:: return home          Expected Discharge Plan and Services                                              Prior Living Arrangements/Services   Lives with:: Parents (Mom) Patient language and need for interpreter reviewed:: Yes        Need for Family Participation in Patient Care: Yes (Comment) Care giver support system in place?: Yes (comment)   Criminal Activity/Legal Involvement Pertinent to Current Situation/Hospitalization: No - Comment as needed  Activities of Daily Living   ADL Screening (condition at time of admission) Independently performs ADLs?: Yes (appropriate for developmental age) Is the patient deaf or have difficulty hearing?: No Does the patient have difficulty seeing, even when wearing glasses/contacts?: No Does the patient have difficulty concentrating, remembering, or making decisions?: No  Permission Sought/Granted                  Emotional Assessment   Attitude/Demeanor/Rapport: Engaged Affect (typically observed): Accepting Orientation: : Oriented to Self, Oriented to Place, Oriented to  Time, Oriented to Situation Alcohol / Substance Use: Not Applicable Psych Involvement: No (comment)  Admission diagnosis:  Hyponatremia [E87.1] CAP (community acquired pneumonia) [J18.9] Pneumonia of left lower lobe due to  infectious organism [J18.9] Acute on chronic anemia [D64.9] Patient Active Problem List   Diagnosis Date Noted   High anion gap metabolic acidosis 08/31/2023   Chronic pain syndrome 08/31/2023   CAP (community acquired pneumonia) 08/30/2023   Pre-syncope 08/28/2022   Scoliosis of thoracolumbar spine 08/28/2022   Nausea and vomiting 12/31/2021   Pancytopenia (HCC) 12/31/2021   Chest pain 12/31/2021   History of opioid abuse (HCC) 12/31/2021   Factor 5 Leiden mutation, heterozygous (HCC) 07/28/2021   Weight loss 07/24/2021   PNA (pneumonia) 06/24/2021   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 [phone] [place], [place] - [address] AT [facility] [address] [place] [address] Phone: [phone] Fax: [phone]  [facility]  [address] [place] [address] Phone: [phone] Fax: [phone]     Social Drivers of Health (SDOH) Social History: SDOH Screenings   Food Insecurity: No Food Insecurity (08/31/2023)  Housing: Low Risk  (08/31/2023)  Transportation Needs: No Transportation Needs (08/31/2023)  Utilities: Not At Risk (08/31/2023)  Depression (PHQ2-9): Low Risk  (08/28/2022)  Tobacco Use: Low Risk  (08/30/2023)   SDOH Interventions:     Readmission Risk  Interventions     No data to display

## 2023-09-01 NOTE — Progress Notes (Signed)
 There was order for placing Midline. Patient has working PIV access. Talked patient's RN regarding reason to having Midline. She was not sure, but possible blood work due to small veins. Secure chat with Dr. Verdene regarding placing midline and reason to choose Midline. Dr. Verdene concerned for getting lab work. Recommended Dr. Krishana to put in the PICC order for reason of placing blood work. HS [employer]

## 2023-09-02 ENCOUNTER — Telehealth: Payer: Self-pay | Admitting: Pulmonary Disease

## 2023-09-02 DIAGNOSIS — J189 Pneumonia, unspecified organism: Secondary | ICD-10-CM | POA: Diagnosis not present

## 2023-09-02 DIAGNOSIS — D649 Anemia, unspecified: Secondary | ICD-10-CM | POA: Diagnosis not present

## 2023-09-02 DIAGNOSIS — B20 Human immunodeficiency virus [HIV] disease: Secondary | ICD-10-CM | POA: Diagnosis not present

## 2023-09-02 LAB — COMPREHENSIVE METABOLIC PANEL WITH GFR
ALT: 7 U/L (ref 0–44)
AST: 15 U/L (ref 15–41)
Albumin: 2.5 g/dL — ABNORMAL LOW (ref 3.5–5.0)
Alkaline Phosphatase: 32 U/L — ABNORMAL LOW (ref 38–126)
Anion gap: 7 (ref 5–15)
BUN: <5 mg/dL — ABNORMAL LOW (ref 6–20)
CO2: 28 mmol/L (ref 22–32)
Calcium: 8.6 mg/dL — ABNORMAL LOW (ref 8.9–10.3)
Chloride: 106 mmol/L (ref 98–111)
Creatinine, Ser: 0.67 mg/dL (ref 0.44–1.00)
GFR, Estimated: >60 mL/min (ref >60)
Glucose, Bld: 101 mg/dL — ABNORMAL HIGH (ref 70–99)
Potassium: 3.4 mmol/L — ABNORMAL LOW (ref 3.5–5.1)
Sodium: 141 mmol/L (ref 135–145)
Total Bilirubin: 0.3 mg/dL (ref 0.0–1.2)
Total Protein: 6 g/dL — ABNORMAL LOW (ref 6.5–8.1)

## 2023-09-02 LAB — LEGIONELLA PNEUMOPHILA SEROGP 1 UR AG: L. pneumophila Serogp 1 Ur Ag: NEGATIVE

## 2023-09-02 LAB — EXPECTORATED SPUTUM ASSESSMENT W GRAM STAIN, RFLX TO RESP C

## 2023-09-02 LAB — CBC
HCT: 35 % — ABNORMAL LOW (ref 36.0–46.0)
Hemoglobin: 11.1 g/dL — ABNORMAL LOW (ref 12.0–15.0)
MCH: 26.3 pg (ref 26.0–34.0)
MCHC: 31.7 g/dL (ref 30.0–36.0)
MCV: 82.9 fL (ref 80.0–100.0)
Platelets: 267 K/uL (ref 150–400)
RBC: 4.22 MIL/uL (ref 3.87–5.11)
RDW: 16.1 % — ABNORMAL HIGH (ref 11.5–15.5)
WBC: 1.7 K/uL — ABNORMAL LOW (ref 4.0–10.5)
nRBC: 0 % (ref 0.0–0.2)

## 2023-09-02 LAB — GC/CHLAMYDIA PROBE AMP (~~LOC~~) NOT AT ARMC
Chlamydia: NEGATIVE
Comment: NEGATIVE
Comment: NORMAL
Neisseria Gonorrhea: NEGATIVE

## 2023-09-02 LAB — MYCOPLASMA PNEUMONIAE ANTIBODY, IGM: Mycoplasma pneumo IgM: <770 U/mL (ref 0–769)

## 2023-09-02 LAB — MAGNESIUM: Magnesium: 2.1 mg/dL (ref 1.7–2.4)

## 2023-09-02 MED ORDER — SODIUM CHLORIDE 0.9% FLUSH
10.0000 mL | Freq: Two times a day (BID) | INTRAVENOUS | Status: DC
  Administered 2023-09-03: 10 mL

## 2023-09-02 MED ORDER — BICTEGRAVIR-EMTRICITAB-TENOFOV 50-200-25 MG PO TABS
1.0000 | ORAL_TABLET | Freq: Every day | ORAL | Status: DC
  Administered 2023-09-02: 1 via ORAL
  Filled 2023-09-02 (×2): qty 1

## 2023-09-02 MED ORDER — SODIUM CHLORIDE 0.9% FLUSH: 10.0000 mL | INTRAVENOUS | Status: DC | PRN

## 2023-09-02 MED ORDER — ENOXAPARIN SODIUM 30 MG/0.3ML IJ SOSY: 30.0000 mg | PREFILLED_SYRINGE | INTRAMUSCULAR | Status: DC

## 2023-09-02 MED ORDER — BICTEGRAVIR-EMTRICITAB-TENOFOV 50-200-25 MG PO TABS
1.0000 | ORAL_TABLET | Freq: Every day | ORAL | Status: DC
  Filled 2023-09-02: qty 1

## 2023-09-02 NOTE — Progress Notes (Signed)
 [facility] PROGRESS NOTE   Frances Ball FMW:[id number] DOB: [birth year] DOA: 08/30/2023  PCP: Melvenia Corean SAILOR, NP  Brief History: Patient with PMH of HIV noncompliant with medicines, chronic bronchiectasis, anemia, hep C, failure to thrive presented to the hospital with complaints of cough shortness of breath chest pain as well as weight loss.  Thought to have pneumonia.  Hospitalized for further management.  Consultants: Infectious disease.  Pulmonology.  Procedures: None yet    Subjective/Interval History: Patient continues to have a cough with minimal expectoration which is yellowish.  No blood in the sputum.  Concerned about her weight loss.  No other complaints offered.     Assessment/Plan:  Recurrent pneumonia. Chronic bronchiectasis. Presented from nursing cough and shortness of breath as well as left-sided chest pain. Symptoms have been ongoing for last few months. Was treated for pneumonia in April, early July. CT scan shows unresolving pneumonia. Patient reports weight loss as well as fever with a temp going up to 104 few days ago. ID was consulted.  Pulmonology also consulted. Patient currently on cefepime  and doxycycline .  Sputum cultures pending.  AFB evaluation pending.  QuantiFERON pending. Respiratory status is stable for the most part.  Unintentional weight loss. Failure to thrive. In the setting of infection. Monitor for now. Dietitian consulted.   Hyponatremia. Resolved   Anion gap acidosis. Improvement noted.   Chronic hypotension. Systolic BP in 90s. Improving gentle IV hydration.  Also on midodrine .  Patient is chronically on this medication.   Headache. Right brain was unremarkable.  Normocytic anemia No evidence of overt bleeding.  Continue to monitor.   History of HIV and AIDS. ID is following.  Patient has been started on Biktarvy .   Chronic pain syndrome. Home regimen includes Suboxone /buprenorphine  8 mg tablet 3 times  daily.  Prescriber database was reviewed.  Suboxone  was last prescribed on 08/10/2023.  90 tablets were prescribed at that time.   DVT Prophylaxis: Initiate Lovenox  Code Status: Full code Family Communication: Discussed with patient Disposition Plan: Hopefully return home when improved.  Mobilize.    Medications: Scheduled:  bictegravir-emtricitabine -tenofovir  AF  1 tablet Oral Daily   buprenorphine   8 mg Sublingual TID   doxycycline   100 mg Oral Q12H   enoxaparin  (LOVENOX ) injection  40 mg Subcutaneous Q24H   guaiFENesin   600 mg Oral BID   midodrine   2.5 mg Oral TID WC   sodium chloride  HYPERTONIC  4 mL Nebulization BID   Continuous:  ceFEPime  (MAXIPIME ) IV 2 g (09/02/23 0831)   PRN:acetaminophen  **OR** acetaminophen , albuterol , benzonatate , melatonin, ondansetron  (ZOFRAN ) IV **OR** ondansetron   Antibiotics: Anti-infectives (From admission, onward)    Start     Dose/Rate Route Frequency Ordered Stop   09/02/23 1930  bictegravir-emtricitabine -tenofovir  AF (BIKTARVY ) 50-200-25 MG per tablet 1 tablet        1 tablet Oral Daily 09/02/23 0844     09/02/23 1000  bictegravir-emtricitabine -tenofovir  AF (BIKTARVY ) 50-200-25 MG per tablet 1 tablet  Status:  Discontinued        1 tablet Oral Daily 09/02/23 0003 09/02/23 0844   08/31/23 2300  ceFEPIme  (MAXIPIME ) 2 g in sodium chloride  0.9 % 100 mL IVPB        2 g 200 mL/hr over 30 Minutes Intravenous Every 8 hours 08/31/23 1801     08/31/23 2200  doxycycline  (VIBRA -TABS) tablet 100 mg        100 mg Oral Every 12 hours 08/31/23 1440     08/31/23 1830  ceFEPIme  (MAXIPIME ) 2 g  in sodium chloride  0.9 % 100 mL IVPB  Status:  Discontinued        2 g 200 mL/hr over 30 Minutes Intravenous Every 8 hours 08/31/23 1741 08/31/23 1801   08/31/23 1530  amoxicillin -clavulanate (AUGMENTIN ) 875-125 MG per tablet 1 tablet  Status:  Discontinued        1 tablet Oral Every 12 hours 08/31/23 1440 08/31/23 1738   08/31/23 0945   bictegravir-emtricitabine -tenofovir  AF (BIKTARVY ) 50-200-25 MG per tablet 1 tablet  Status:  Discontinued        1 tablet Oral Daily at bedtime 08/31/23 0945 08/31/23 1419   08/31/23 0800  doxycycline  (VIBRAMYCIN ) 100 mg in sodium chloride  0.9 % 250 mL IVPB  Status:  Discontinued        100 mg 125 mL/hr over 120 Minutes Intravenous Every 12 hours 08/30/23 2107 08/31/23 1440   08/30/23 2115  ceFEPIme  (MAXIPIME ) 2 g in sodium chloride  0.9 % 100 mL IVPB  Status:  Discontinued        2 g 200 mL/hr over 30 Minutes Intravenous Every 8 hours 08/30/23 2110 08/31/23 1440   08/30/23 2100  bictegravir-emtricitabine -tenofovir  AF (BIKTARVY ) 50-200-25 MG per tablet 1 tablet  Status:  Discontinued        1 tablet Oral Daily 08/30/23 2052 08/31/23 0945   08/30/23 2045  doxycycline  (VIBRAMYCIN ) 100 mg in sodium chloride  0.9 % 250 mL IVPB        100 mg 125 mL/hr over 120 Minutes Intravenous  Once 08/30/23 2032 08/30/23 2335   08/30/23 1830  cefTRIAXone  (ROCEPHIN ) 2 g in sodium chloride  0.9 % 100 mL IVPB        2 g 200 mL/hr over 30 Minutes Intravenous Once 08/30/23 1823 08/30/23 1917       Objective:  Vital Signs  Vitals:   09/01/23 1942 09/01/23 2348 09/02/23 0434 09/02/23 0703  BP: 99/70 99/72 100/68 94/74  Pulse: 87 78 78 90  Resp: 17     Temp: 98.5 F (36.9 C) 97.8 F (36.6 C) 98.4 F (36.9 C) 98.3 F (36.8 C)  TempSrc: Oral Oral Oral Oral  SpO2: 96% 96% 95% 96%  Weight:   44.5 kg   Height:        Intake/Output Summary (Last 24 hours) at 09/02/2023 0926 Last data filed at 09/02/2023 0300 Gross per 24 hour  Intake 900 ml  Output --  Net 900 ml   Filed Weights   08/31/23 1224 09/01/23 0506 09/02/23 0434  Weight: 46.3 kg 45.3 kg 44.5 kg    General appearance: Awake alert.  In no distress Resp: Crackles at the bases left more than right.  No wheezing or rhonchi. Cardio: S1-S2 is normal regular.  No S3-S4.  No rubs murmurs or bruit GI: Abdomen is soft.  Nontender nondistended.   Bowel sounds are present normal.  No masses organomegaly Extremities: No edema.  Full range of motion of lower extremities. Neurologic: Alert and oriented x3.  No focal neurological deficits.   Lab Results:  Data Reviewed: I have personally reviewed following labs and reports of the imaging studies  CBC: Recent Labs  Lab 08/30/23 1805 08/30/23 1814 08/31/23 0250  WBC 8.8  --  4.1  NEUTROABS 7.1  --  3.1  HGB 10.6* 11.9* 10.1*  HCT 33.5* 35.0* 31.9*  MCV 82.3  --  83.3  PLT 337  --  292    Basic Metabolic Panel: Recent Labs  Lab 08/30/23 1805 08/30/23 1808 08/30/23 1814 08/31/23 0250  NA  132*  --  133* 138  K 3.5  --  3.5 3.8  CL 94*  --  95* 101  CO2 21*  --   --  23  GLUCOSE 82  --  84 84  BUN <5*  --  3* <5*  CREATININE 1.00  --  0.70 0.88  CALCIUM 8.5*  --   --  8.2*  MG  --  2.0  --  1.9  PHOS  --   --   --  2.7    GFR: Estimated Creatinine Clearance: 63.3 mL/min (by C-G formula based on SCr of 0.88 mg/dL).  Liver Function Tests: Recent Labs  Lab 08/30/23 1805 08/31/23 0250  AST 16 13*  ALT 7 7  ALKPHOS 39 31*  BILITOT 1.3* 0.8  PROT 7.3 6.3*  ALBUMIN  3.0* 2.6*    Coagulation Profile: Recent Labs  Lab 08/30/23 1805  INR 1.1    Thyroid  Function Tests: Recent Labs    08/31/23 0250  TSH 0.710     Recent Results (from the past 240 hours)  Resp panel by RT-PCR (RSV, Flu A&B, Covid) Anterior Nasal Swab     Status: None   Collection Time: 08/30/23  4:20 PM   Specimen: Anterior Nasal Swab  Result Value Ref Range Status   SARS Coronavirus 2 by RT PCR NEGATIVE NEGATIVE Final   Influenza A by PCR NEGATIVE NEGATIVE Final   Influenza B by PCR NEGATIVE NEGATIVE Final    Comment: (NOTE) The Xpert Xpress SARS-CoV-2/FLU/RSV plus assay is intended as an aid in the diagnosis of influenza from Nasopharyngeal swab specimens and should not be used as a sole basis for treatment. Nasal washings and aspirates are unacceptable for Xpert Xpress  SARS-CoV-2/FLU/RSV testing.  Fact Sheet for Patients: BloggerCourse.com  Fact Sheet for Healthcare Providers: SeriousBroker.it  This test is not yet approved or cleared by the [place]  FDA and has been authorized for detection and/or diagnosis of SARS-CoV-2 by FDA under an Emergency Use Authorization (EUA). This EUA will remain in effect (meaning this test can be used) for the duration of the COVID-19 declaration under Section 564(b)(1) of the Act, 21 U.S.C. section 360bbb-3(b)(1), unless the authorization is terminated or revoked.     Resp Syncytial Virus by PCR NEGATIVE NEGATIVE Final    Comment: (NOTE) Fact Sheet for Patients: BloggerCourse.com  Fact Sheet for Healthcare Providers: SeriousBroker.it  This test is not yet approved or cleared by the [place]  FDA and has been authorized for detection and/or diagnosis of SARS-CoV-2 by FDA under an Emergency Use Authorization (EUA). This EUA will remain in effect (meaning this test can be used) for the duration of the COVID-19 declaration under Section 564(b)(1) of the Act, 21 U.S.C. section 360bbb-3(b)(1), unless the authorization is terminated or revoked.  Performed at [facility], [address], [place], [place] [address]   Blood Culture (routine x 2)     Status: None (Preliminary result)   Collection Time: 08/30/23  4:25 PM   Specimen: BLOOD LEFT ARM  Result Value Ref Range Status   Specimen Description BLOOD LEFT ARM  Final   Special Requests   Final    BOTTLES DRAWN AEROBIC AND ANAEROBIC Blood Culture results may not be optimal due to an inadequate volume of blood received in culture bottles   Culture   Final    NO GROWTH 2 DAYS Performed at [facility], [address] [place], [place] [address]    Report Status PENDING  Incomplete  Blood Culture (routine x 2)     Status: None (Preliminary  result)   Collection Time: 08/30/23  6:05 PM   Specimen: BLOOD LEFT ARM  Result Value Ref Range Status   Specimen Description BLOOD LEFT ARM  Final   Special Requests   Final    BOTTLES DRAWN AEROBIC AND ANAEROBIC Blood Culture adequate volume   Culture   Final    NO GROWTH 2 DAYS Performed at [facility], [address], [place], [place] [address]    Report Status PENDING  Incomplete  Respiratory (~20 pathogens) panel by PCR     Status: None   Collection Time: 08/31/23  8:43 AM   Specimen: Nasopharyngeal Swab; Respiratory  Result Value Ref Range Status   Adenovirus NOT DETECTED NOT DETECTED Final   Coronavirus 229E NOT DETECTED NOT DETECTED Final    Comment: (NOTE) The Coronavirus on the Respiratory Panel, DOES NOT test for the novel  Coronavirus (2019 nCoV)    Coronavirus HKU1 NOT DETECTED NOT DETECTED Final   Coronavirus NL63 NOT DETECTED NOT DETECTED Final   Coronavirus OC43 NOT DETECTED NOT DETECTED Final   Metapneumovirus NOT DETECTED NOT DETECTED Final   Rhinovirus / Enterovirus NOT DETECTED NOT DETECTED Final   Influenza A NOT DETECTED NOT DETECTED Final   Influenza B NOT DETECTED NOT DETECTED Final   Parainfluenza Virus 1 NOT DETECTED NOT DETECTED Final   Parainfluenza Virus 2 NOT DETECTED NOT DETECTED Final   Parainfluenza Virus 3 NOT DETECTED NOT DETECTED Final   Parainfluenza Virus 4 NOT DETECTED NOT DETECTED Final   Respiratory Syncytial Virus NOT DETECTED NOT DETECTED Final   Bordetella pertussis NOT DETECTED NOT DETECTED Final   Bordetella Parapertussis NOT DETECTED NOT DETECTED Final   Chlamydophila pneumoniae NOT DETECTED NOT DETECTED Final   Mycoplasma pneumoniae NOT DETECTED NOT DETECTED Final    Comment: Performed at [facility], [address] [place], [place] [address]  MRSA Next Gen by PCR, Nasal     Status: None   Collection Time: 08/31/23 11:27 AM  Result Value Ref Range Status   MRSA by PCR Next Gen NOT DETECTED NOT DETECTED Final     Comment: (NOTE) The GeneXpert MRSA Assay (FDA approved for NASAL specimens only), is one component of a comprehensive MRSA colonization surveillance program. It is not intended to diagnose MRSA infection nor to guide or monitor treatment for MRSA infections. Test performance is not FDA approved in patients less than 68 years old. Performed at [facility], [address], [place], [place] [address]   Expectorated Sputum Assessment w Gram Stain, Rflx to Resp Cult     Status: None   Collection Time: 09/01/23  8:45 AM   Specimen: Expectorated Sputum  Result Value Ref Range Status   Specimen Description EXPECTORATED SPUTUM  Final   Special Requests NONE  Final   Sputum evaluation   Final    THIS SPECIMEN IS ACCEPTABLE FOR SPUTUM CULTURE Performed at [facility], [address], [place], [place] [address]    Report Status 09/01/2023 FINAL  Final  Culture, Respiratory w Gram Stain     Status: None (Preliminary result)   Collection Time: 09/01/23  8:45 AM  Result Value Ref Range Status   Specimen Description EXPECTORATED SPUTUM  Final   Special Requests NONE Reflexed from [id number]  Final   Gram Stain   Final    FEW WBC PRESENT, PREDOMINANTLY PMN MODERATE GRAM NEGATIVE RODS RARE  GRAM POSITIVE COCCI IN CHAINS Performed at [facility], [address] [place], [place] [address]    Culture PENDING  Incomplete   Report Status PENDING  Incomplete      Radiology Studies: US  EKG SITE RITE Result Date: 09/01/2023 If Site Rite image not attached, placement could not be confirmed due to current cardiac rhythm.  MR BRAIN W WO CONTRAST Result Date: 08/31/2023 CLINICAL DATA:  aids; headache EXAM: MRI HEAD WITHOUT AND WITH CONTRAST TECHNIQUE: Multiplanar, multiecho pulse sequences of the brain and surrounding structures were obtained without and with intravenous contrast. CONTRAST:  4mL GADAVIST  GADOBUTROL  1 MMOL/ML IV SOLN COMPARISON:  MRI of the head dated July 28, 2017.  FINDINGS: Brain: There is mild cerebral volume loss present. The brain is unremarkable. There is no evidence of hemorrhage, mass, cortical infarct or hydrocephalus. There is no abnormal parenchymal or meningeal enhancement. Vascular: Normal flow voids. Skull and upper cervical spine: No osseous lesions. Sinuses/Orbits: Moderate opacification of the maxillary sinuses. There is also a ethmoid air cell disease and mild mucosal disease within the sphenoid sinuses. The orbits are unremarkable. Other: None. IMPRESSION: 1. Normal brain. 2. Mild-to-moderate paranasal sinus disease. Electronically Signed   By: Evalene Coho M.D.   On: 08/31/2023 17:02       LOS: 3 days   Venda Dice Verdene  [facility] Pager on [email or web address]  09/02/2023, 9:26 AM

## 2023-09-02 NOTE — Plan of Care (Signed)
  Problem: Education: Goal: Knowledge of General Education information will improve Description: Including pain rating scale, medication(s)/side effects and non-pharmacologic comfort measures Outcome: Progressing   Problem: Health Behavior/Discharge Planning: Goal: Ability to manage health-related needs will improve Outcome: Progressing   Problem: Clinical Measurements: Goal: Ability to maintain clinical measurements within normal limits will improve Outcome: Progressing Goal: Respiratory complications will improve Outcome: Progressing   Problem: Nutrition: Goal: Adequate nutrition will be maintained Outcome: Progressing   Problem: Pain Managment: Goal: General experience of comfort will improve and/or be controlled Outcome: Progressing   Problem: Safety: Goal: Ability to remain free from injury will improve Outcome: Progressing

## 2023-09-02 NOTE — Plan of Care (Signed)

## 2023-09-02 NOTE — Telephone Encounter (Signed)
 Please arrange for outpatient hospital follow-up with APP/ new MD in 4 to 6 weeks Bronchiectasis with staph pneumonia

## 2023-09-02 NOTE — Progress Notes (Signed)
 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, admitted for several weeks of progressive dyspnea, not responding to pneumonia treatment in setting of LLL dense airspace consolidation and 20 pound weight loss     Micro -- Resp viral pcr negative Crypto ag negative Blasto, histo in progress  7/30 mtb pcr #1 ip 7/30  mtb pcr #2 ip 7/30 sputum cx gnr, rare staph aureus 7/30 AFB expectorated sputum ip  7/29 quantiferon gold ip 7/29 mrsa nares pcr negative 7/28 blood cx ngtd    #pna #weight loss -she was on biktarvy  until a few months ago and was taking it with good compliance and so the weight loss is concerning as not likely from hiv standpoint -ddx tb, endemic fungi. Rhodococcus/nocardia is less likely. Non-id cause include malignancy -she can produce sputum and will get afb cx -discussed with primary regarding non-id cause and team will discuss with pulmonology -would treat empirically for cap with perhaps a longer course while r/o tb -admission bcx ngtd and resp viral pcr negative   -pulm consulted and would like to cover empirically for pseudomonas as thinking she could have bronchiectasis flare      #hiv/aids #oi prophy Treatment experienced multiple regimen in the past including atripla, combivir /kaletra, isentress/prezista /norvir/viread and fuzeon, genovya (unsuppressed), biktarvy  pluse evotaz  then biktarvy  alone since 2020 Took biktarvy  with good compliance but hasn't for the past few months.  Sees rcid Corean Fireman LOV 08/2022 Recent Labs       Lab Results  Component Value Date    HIV1RNAQUANT Not Detected 08/28/2022      Cd4 166 @ 08/2022 (13%) Brain mri unremarkable and crypto ag negative Start biktarvy  7/31  Do not suspect  pjp and will her on bactrim prophy eventually -- pending respiratory cx. I do not suspect nocardia           #headache Left sided subacute. 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 Repeat hepatitis screening     #hcm sent rpr, urine gc/chlam and repeat hepatitis panel     Plan: Once all 3 sputum AFB cx are sent, and the regular respiratory cx finalize (so we can pick oral abx regimen for her), then can likely be discharge I have notify [facility] so they can follow her afb culture ID clinic f/u arranged for 8/20 @ 230 Plan to give 3 weeks typical bacterial pna treatment but only 1 week doxy Continue biktarvy  Continue cefepime  and doxy Maintain negative airborne isolation Discussed with triad      Principal Problem:   CAP (community acquired pneumonia) Active Problems:   HIV (human immunodeficiency virus infection) (HCC)   Acute hyponatremia   SOB (shortness of breath)   Anemia of chronic disease   High anion  gap metabolic acidosis   Chronic pain syndrome   Allergies  Allergen Reactions   Levaquin [Levofloxacin In D5w] Shortness Of Breath and Rash   Zithromax [Azithromycin] Shortness Of Breath, Swelling and Other (See Comments)    Pt reports tightness in skin with redness    Bactrim [Sulfamethoxazole-Trimethoprim] Swelling and Other (See Comments)    Pt reports that she was placed on Bactrim for pneumonia when she was d/c from [facility] and developed a red face with swelling and can not tolerate Bactrim.    Sulfa Antibiotics Swelling    Facial swelling and burning   Vancomycin  Other (See Comments)    Intolerance, pt states that as long as med is administered with Benadryl  its tolarable   Darunavir  Rash    Scheduled Meds:   bictegravir-emtricitabine -tenofovir  AF  1 tablet Oral Daily   buprenorphine   8 mg Sublingual TID   doxycycline   100 mg Oral Q12H   [START ON 09/03/2023] enoxaparin  (LOVENOX ) injection  30 mg Subcutaneous Q24H   guaiFENesin   600 mg Oral BID   midodrine   2.5 mg Oral TID WC   sodium chloride  HYPERTONIC  4 mL Nebulization BID   Continuous Infusions:  ceFEPime  (MAXIPIME ) IV 2 g (09/02/23 0831)   PRN Meds:.acetaminophen  **OR** acetaminophen , albuterol , benzonatate , melatonin, ondansetron  (ZOFRAN ) IV **OR** ondansetron    SUBJECTIVE: No complaint   Review of Systems: ROS All other ROS was negative, except mentioned above     OBJECTIVE: Vitals:   09/01/23 2348 09/02/23 0434 09/02/23 0703 09/02/23 1100  BP: 99/72 100/68 94/74 95/70   Pulse: 78 78 90 81  Resp:    15  Temp: 97.8 F (36.6 C) 98.4 F (36.9 C) 98.3 F (36.8 C) 98.7 F (37.1 C)  TempSrc: Oral Oral Oral Oral  SpO2: 96% 95% 96% 94%  Weight:  44.5 kg    Height:       Body mass index is 17.94 kg/m.  Physical Exam Chart reviewed only   Lab Results Lab Results  Component Value Date   WBC 4.1 08/31/2023   HGB 10.1 (L) 08/31/2023   HCT 31.9 (L) 08/31/2023   MCV 83.3 08/31/2023   PLT 292 08/31/2023    Lab Results  Component Value Date   CREATININE 0.88 08/31/2023   BUN <5 (L) 08/31/2023   NA 138 08/31/2023   K 3.8 08/31/2023   CL 101 08/31/2023   CO2 23 08/31/2023    Lab Results  Component Value Date   ALT 7 08/31/2023   AST 13 (L) 08/31/2023   GGT 12 10/07/2017   ALKPHOS 31 (L) 08/31/2023   BILITOT 0.8 08/31/2023      Microbiology: Recent Results (from the past 240 hours)  Resp panel by RT-PCR (RSV, Flu A&B, Covid) Anterior Nasal Swab     Status: None   Collection Time: 08/30/23  4:20 PM   Specimen: Anterior Nasal Swab  Result Value Ref Range Status   SARS Coronavirus 2 by RT PCR NEGATIVE NEGATIVE Final   Influenza A by PCR NEGATIVE NEGATIVE Final   Influenza B by PCR NEGATIVE NEGATIVE Final     Comment: (NOTE) The Xpert Xpress SARS-CoV-2/FLU/RSV plus assay is intended as an aid in the diagnosis of influenza from Nasopharyngeal swab specimens and should not be used as a sole basis for treatment. Nasal washings and aspirates are unacceptable for Xpert Xpress SARS-CoV-2/FLU/RSV testing.  Fact Sheet for Patients: BloggerCourse.com  Fact Sheet for Healthcare Providers: SeriousBroker.it  This test is not yet approved or cleared by the  [place]  FDA and has been authorized for detection and/or diagnosis of SARS-CoV-2 by FDA under an Emergency Use Authorization (EUA). This EUA will remain in effect (meaning this test can be used) for the duration of the COVID-19 declaration under Section 564(b)(1) of the Act, 21 U.S.C. section 360bbb-3(b)(1), unless the authorization is terminated or revoked.     Resp Syncytial Virus by PCR NEGATIVE NEGATIVE Final    Comment: (NOTE) Fact Sheet for Patients: BloggerCourse.com  Fact Sheet for Healthcare Providers: SeriousBroker.it  This test is not yet approved or cleared by the [place]  FDA and has been authorized for detection and/or diagnosis of SARS-CoV-2 by FDA under an Emergency Use Authorization (EUA). This EUA will remain in effect (meaning this test can be used) for the duration of the COVID-19 declaration under Section 564(b)(1) of the Act, 21 U.S.C. section 360bbb-3(b)(1), unless the authorization is terminated or revoked.  Performed at [facility], [address], [place], [place] [address]   Blood Culture (routine x 2)     Status: None (Preliminary result)   Collection Time: 08/30/23  4:25 PM   Specimen: BLOOD LEFT ARM  Result Value Ref Range Status   Specimen Description BLOOD LEFT ARM  Final   Special Requests   Final    BOTTLES DRAWN AEROBIC AND ANAEROBIC Blood Culture results may not be optimal due to an  inadequate volume of blood received in culture bottles   Culture   Final    NO GROWTH 3 DAYS Performed at [facility], [address] [place], [place] [address]    Report Status PENDING  Incomplete  Blood Culture (routine x 2)     Status: None (Preliminary result)   Collection Time: 08/30/23  6:05 PM   Specimen: BLOOD LEFT ARM  Result Value Ref Range Status   Specimen Description BLOOD LEFT ARM  Final   Special Requests   Final    BOTTLES DRAWN AEROBIC AND ANAEROBIC Blood Culture adequate volume   Culture   Final    NO GROWTH 3 DAYS Performed at [facility], [address], [place], [place] [address]    Report Status PENDING  Incomplete  Respiratory (~20 pathogens) panel by PCR     Status: None   Collection Time: 08/31/23  8:43 AM   Specimen: Nasopharyngeal Swab; Respiratory  Result Value Ref Range Status   Adenovirus NOT DETECTED NOT DETECTED Final   Coronavirus 229E NOT DETECTED NOT DETECTED Final    Comment: (NOTE) The Coronavirus on the Respiratory Panel, DOES NOT test for the novel  Coronavirus (2019 nCoV)    Coronavirus HKU1 NOT DETECTED NOT DETECTED Final   Coronavirus NL63 NOT DETECTED NOT DETECTED Final   Coronavirus OC43 NOT DETECTED NOT DETECTED Final   Metapneumovirus NOT DETECTED NOT DETECTED Final   Rhinovirus / Enterovirus NOT DETECTED NOT DETECTED Final   Influenza A NOT DETECTED NOT DETECTED Final   Influenza B NOT DETECTED NOT DETECTED Final   Parainfluenza Virus 1 NOT DETECTED NOT DETECTED Final   Parainfluenza Virus 2 NOT DETECTED NOT DETECTED Final   Parainfluenza Virus 3 NOT DETECTED NOT DETECTED Final   Parainfluenza Virus 4 NOT DETECTED NOT DETECTED Final   Respiratory Syncytial Virus NOT DETECTED NOT DETECTED Final   Bordetella pertussis NOT DETECTED NOT DETECTED Final   Bordetella Parapertussis NOT DETECTED NOT DETECTED Final   Chlamydophila pneumoniae NOT DETECTED NOT DETECTED Final   Mycoplasma pneumoniae NOT DETECTED NOT DETECTED  Final  Comment: Performed at [facility], [address], [place], [place] [address]  MRSA Next Gen by PCR, Nasal     Status: None   Collection Time: 08/31/23 11:27 AM  Result Value Ref Range Status   MRSA by PCR Next Gen NOT DETECTED NOT DETECTED Final    Comment: (NOTE) The GeneXpert MRSA Assay (FDA approved for NASAL specimens only), is one component of a comprehensive MRSA colonization surveillance program. It is not intended to diagnose MRSA infection nor to guide or monitor treatment for MRSA infections. Test performance is not FDA approved in patients less than 41 years old. Performed at [facility], [address] [place], [place] [address]   Expectorated Sputum Assessment w Gram Stain, Rflx to Resp Cult     Status: None   Collection Time: 09/01/23  8:45 AM   Specimen: Expectorated Sputum  Result Value Ref Range Status   Specimen Description EXPECTORATED SPUTUM  Final   Special Requests NONE  Final   Sputum evaluation   Final    THIS SPECIMEN IS ACCEPTABLE FOR SPUTUM CULTURE Performed at [facility], [address], [place], [place] [address]    Report Status 09/01/2023 FINAL  Final  Culture, Respiratory w Gram Stain     Status: None (Preliminary result)   Collection Time: 09/01/23  8:45 AM  Result Value Ref Range Status   Specimen Description EXPECTORATED SPUTUM  Final   Special Requests NONE Reflexed from [id number]  Final   Gram Stain   Final    FEW WBC PRESENT, PREDOMINANTLY PMN MODERATE GRAM NEGATIVE RODS RARE GRAM POSITIVE COCCI IN CHAINS    Culture   Final    FEW STAPHYLOCOCCUS AUREUS CULTURE REINCUBATED FOR BETTER GROWTH Performed at [facility], [address] [place], [place] [address]    Report Status PENDING  Incomplete     Serology:   Imaging: If present, new imagings (plain films, ct scans, and mri) have been personally visualized and interpreted; radiology reports have been reviewed. Decision making incorporated into  the Impression / Recommendations.  7/29 brain mri No acute abnormality Mild-mod parasinus disease  7/29 chest ct IMPRESSION: 1. No evidence of acute pulmonary embolism. 2. Dense consolidation of the left lower lobe with extensive airway impaction. Scatter nodular airspace infiltrate within the lingula and to a lesser extent right lower lobe most in keeping with changes acute infection in the appropriate clinical setting.  Constance ONEIDA Passer, MD [facility] [phone] pager    09/02/2023, 2:55 PM

## 2023-09-02 NOTE — Telephone Encounter (Signed)
 PT has been scheduled.

## 2023-09-02 NOTE — Progress Notes (Signed)

## 2023-09-02 NOTE — Progress Notes (Signed)
 Respiratory culture showing Staph aureus, there was moderate gram-negative rods that did not grow out . Since MRSA PCR was negative, this is likely to be MSSA.  If so, can simplify to cefazolin. Once again the key here would be adequate clearance with flutter valve and saline nebs and Mucinex . Once she is clinically improved, imaging can be followed as outpatient  Since ID is already following, PCCM will be available as needed  Rowyn Spilde V. Jude MD

## 2023-09-03 ENCOUNTER — Other Ambulatory Visit (HOSPITAL_COMMUNITY): Payer: Self-pay

## 2023-09-03 DIAGNOSIS — J189 Pneumonia, unspecified organism: Secondary | ICD-10-CM | POA: Diagnosis not present

## 2023-09-03 LAB — COMPREHENSIVE METABOLIC PANEL WITH GFR
ALT: 7 U/L (ref 0–44)
AST: 16 U/L (ref 15–41)
Albumin: 2.5 g/dL — ABNORMAL LOW (ref 3.5–5.0)
Alkaline Phosphatase: 30 U/L — ABNORMAL LOW (ref 38–126)
Anion gap: 9 (ref 5–15)
BUN: <5 mg/dL — ABNORMAL LOW (ref 6–20)
CO2: 25 mmol/L (ref 22–32)
Calcium: 8.5 mg/dL — ABNORMAL LOW (ref 8.9–10.3)
Chloride: 106 mmol/L (ref 98–111)
Creatinine, Ser: 0.7 mg/dL (ref 0.44–1.00)
GFR, Estimated: >60 mL/min (ref >60)
Glucose, Bld: 91 mg/dL (ref 70–99)
Potassium: 3.5 mmol/L (ref 3.5–5.1)
Sodium: 140 mmol/L (ref 135–145)
Total Bilirubin: 0.3 mg/dL (ref 0.0–1.2)
Total Protein: 6.2 g/dL — ABNORMAL LOW (ref 6.5–8.1)

## 2023-09-03 LAB — CBC
HCT: 28.3 % — ABNORMAL LOW (ref 36.0–46.0)
Hemoglobin: 9.1 g/dL — ABNORMAL LOW (ref 12.0–15.0)
MCH: 26.2 pg (ref 26.0–34.0)
MCHC: 32.2 g/dL (ref 30.0–36.0)
MCV: 81.6 fL (ref 80.0–100.0)
Platelets: 322 K/uL (ref 150–400)
RBC: 3.47 MIL/uL — ABNORMAL LOW (ref 3.87–5.11)
RDW: 16 % — ABNORMAL HIGH (ref 11.5–15.5)
WBC: 1.5 K/uL — ABNORMAL LOW (ref 4.0–10.5)
nRBC: 0 % (ref 0.0–0.2)

## 2023-09-03 LAB — QUANTIFERON-TB GOLD PLUS (RQFGPL)
QuantiFERON Mitogen Value: 0.39 [IU]/mL
QuantiFERON Nil Value: 0.03 [IU]/mL
QuantiFERON TB1 Ag Value: 0.03 [IU]/mL
QuantiFERON TB2 Ag Value: 0.03 [IU]/mL

## 2023-09-03 LAB — QUANTIFERON-TB GOLD PLUS: QuantiFERON-TB Gold Plus: UNDETERMINED — AB

## 2023-09-03 LAB — BLASTOMYCES ANTIGEN
Blastomyces Antigen: NOT DETECTED ng/mL
Interpretation: NEGATIVE

## 2023-09-03 LAB — FOLATE: Folate: 5.1 ng/mL — ABNORMAL LOW (ref >5.9)

## 2023-09-03 LAB — HEMOGLOBIN AND HEMATOCRIT, BLOOD
HCT: 28.9 % — ABNORMAL LOW (ref 36.0–46.0)
Hemoglobin: 9.1 g/dL — ABNORMAL LOW (ref 12.0–15.0)

## 2023-09-03 MED ORDER — MIDODRINE HCL 2.5 MG PO TABS
2.5000 mg | ORAL_TABLET | Freq: Three times a day (TID) | ORAL | 0 refills | Status: DC
  Filled 2023-09-03: qty 90, 30d supply, fill #0

## 2023-09-03 MED ORDER — AMOXICILLIN-POT CLAVULANATE 875-125 MG PO TABS
1.0000 | ORAL_TABLET | Freq: Two times a day (BID) | ORAL | 0 refills | Status: AC
  Filled 2023-09-03: qty 34, 17d supply, fill #0

## 2023-09-03 MED ORDER — POTASSIUM CHLORIDE CRYS ER 20 MEQ PO TBCR
40.0000 meq | EXTENDED_RELEASE_TABLET | Freq: Once | ORAL | Status: AC
  Administered 2023-09-03: 40 meq via ORAL
  Filled 2023-09-03: qty 2

## 2023-09-03 MED ORDER — BENZONATATE 200 MG PO CAPS
200.0000 mg | ORAL_CAPSULE | Freq: Three times a day (TID) | ORAL | 0 refills | Status: DC | PRN
  Filled 2023-09-03: qty 20, 7d supply, fill #0

## 2023-09-03 MED ORDER — BIKTARVY 50-200-25 MG PO TABS
1.0000 | ORAL_TABLET | Freq: Every day | ORAL | 0 refills | Status: DC
  Filled 2023-09-03: qty 30, 30d supply, fill #0

## 2023-09-03 MED ORDER — FOLIC ACID 1 MG PO TABS
1.0000 mg | ORAL_TABLET | Freq: Every day | ORAL | 0 refills | Status: DC
  Filled 2023-09-03: qty 90, 90d supply, fill #0

## 2023-09-03 MED ORDER — AMOXICILLIN-POT CLAVULANATE 875-125 MG PO TABS
1.0000 | ORAL_TABLET | Freq: Two times a day (BID) | ORAL | Status: DC
  Administered 2023-09-03: 1 via ORAL
  Filled 2023-09-03: qty 1

## 2023-09-03 MED ORDER — ORAL CARE MOUTH RINSE
15.0000 mL | OROMUCOSAL | Status: DC | PRN
Start: 2023-09-03 — End: 2023-09-03

## 2023-09-03 MED ORDER — DOXYCYCLINE HYCLATE 100 MG PO TABS
100.0000 mg | ORAL_TABLET | Freq: Two times a day (BID) | ORAL | 0 refills | Status: AC
  Filled 2023-09-03: qty 6, 3d supply, fill #0

## 2023-09-03 MED ORDER — FOLIC ACID 1 MG PO TABS
1.0000 mg | ORAL_TABLET | Freq: Every day | ORAL | Status: DC
  Administered 2023-09-03: 1 mg via ORAL
  Filled 2023-09-03: qty 1

## 2023-09-03 NOTE — Discharge Summary (Signed)
 [facility]  Physician Discharge Summary   Patient ID: Frances Ball MRN: [id number] DOB/AGE: 03-Jan-1991 33 y.o.  Admit date: 08/30/2023 Discharge date: 09/03/2023    PCP: Melvenia Corean SAILOR, NP  DISCHARGE DIAGNOSES:    CAP (community acquired pneumonia)   HIV (human immunodeficiency virus infection) (HCC)   Anemia of chronic disease   High anion gap metabolic acidosis   Chronic pain syndrome   RECOMMENDATIONS FOR OUTPATIENT FOLLOW UP: ID to arrange outpatient follow-up ID to follow-up on results of sputum AFB results Pulmonology to schedule outpatient follow-up   Home Health: None Equipment/Devices: None  CODE STATUS: Full code  DISCHARGE CONDITION: fair  Diet recommendation: As before  INITIAL HISTORY: Patient with PMH of HIV noncompliant with medicines, chronic bronchiectasis, anemia, hep C, failure to thrive presented to the hospital with complaints of cough shortness of breath chest pain as well as weight loss.  Thought to have pneumonia.  Hospitalized for further management.   Consultants: Infectious disease.  Pulmonology.   Procedures: None  HOSPITAL COURSE:   Recurrent pneumonia. Chronic bronchiectasis. Presented from nursing cough and shortness of breath as well as left-sided chest pain. Symptoms have been ongoing for last few months. Was treated for pneumonia in April, early July. CT scan shows unresolving pneumonia. Patient reports weight loss as well as fever with a temp going up to 104 few days ago. ID was consulted.  Pulmonology also consulted.  Did not undergo any procedures.  Pulmonology to schedule outpatient follow-up after patient has completed treatment. Sputum cultures with staph.  AFB evaluation is pending but will be followed up by ID. Respiratory status is stable.  She is not requiring any oxygen. She will be discharged on oral antibiotics with doxycycline  and Augmentin    Unintentional weight loss. Failure to thrive. In the  setting of infection.   Leukopenia Likely secondary to HIV status as well as active infection.  She is afebrile.  Will need outpatient monitoring.   Hyponatremia. Resolved   Anion gap acidosis. Improvement noted.   Chronic hypotension. Systolic BP in 90s. Improving gentle IV hydration.  Also on midodrine .  Patient is chronically on this medication.   Headache. MRI brain was unremarkable.   Normocytic anemia No evidence of overt bleeding.  Drop in hemoglobin is likely dilutional.  Monitor in the outpatient setting.   History of HIV and AIDS. ID is following.  Patient has been started on Biktarvy .   Chronic pain syndrome. Home regimen includes Suboxone /buprenorphine  8 mg tablet 3 times daily.  Prescriber database was reviewed.  Suboxone  was last prescribed on 08/10/2023.  90 tablets were prescribed at that time.  Patient is stable.  Okay for discharge home today.  Cleared by infectious disease.   PERTINENT LABS:  The results of significant diagnostics from this hospitalization (including imaging, microbiology, ancillary and laboratory) are listed below for reference.    Microbiology: Recent Results (from the past 240 hours)  Resp panel by RT-PCR (RSV, Flu A&B, Covid) Anterior Nasal Swab     Status: None   Collection Time: 08/30/23  4:20 PM   Specimen: Anterior Nasal Swab  Result Value Ref Range Status   SARS Coronavirus 2 by RT PCR NEGATIVE NEGATIVE Final   Influenza A by PCR NEGATIVE NEGATIVE Final   Influenza B by PCR NEGATIVE NEGATIVE Final    Comment: (NOTE) The Xpert Xpress SARS-CoV-2/FLU/RSV plus assay is intended as an aid in the diagnosis of influenza from Nasopharyngeal swab specimens and should not be used as a sole  basis for treatment. Nasal washings and aspirates are unacceptable for Xpert Xpress SARS-CoV-2/FLU/RSV testing.  Fact Sheet for Patients: BloggerCourse.com  Fact Sheet for Healthcare  Providers: SeriousBroker.it  This test is not yet approved or cleared by the [place]  FDA and has been authorized for detection and/or diagnosis of SARS-CoV-2 by FDA under an Emergency Use Authorization (EUA). This EUA will remain in effect (meaning this test can be used) for the duration of the COVID-19 declaration under Section 564(b)(1) of the Act, 21 U.S.C. section 360bbb-3(b)(1), unless the authorization is terminated or revoked.     Resp Syncytial Virus by PCR NEGATIVE NEGATIVE Final    Comment: (NOTE) Fact Sheet for Patients: BloggerCourse.com  Fact Sheet for Healthcare Providers: SeriousBroker.it  This test is not yet approved or cleared by the [place]  FDA and has been authorized for detection and/or diagnosis of SARS-CoV-2 by FDA under an Emergency Use Authorization (EUA). This EUA will remain in effect (meaning this test can be used) for the duration of the COVID-19 declaration under Section 564(b)(1) of the Act, 21 U.S.C. section 360bbb-3(b)(1), unless the authorization is terminated or revoked.  Performed at [facility], [address], [place], [place] [address]   Blood Culture (routine x 2)     Status: None   Collection Time: 08/30/23  4:25 PM   Specimen: BLOOD LEFT ARM  Result Value Ref Range Status   Specimen Description BLOOD LEFT ARM  Final   Special Requests   Final    BOTTLES DRAWN AEROBIC AND ANAEROBIC Blood Culture results may not be optimal due to an inadequate volume of blood received in culture bottles   Culture   Final    NO GROWTH 5 DAYS Performed at [facility], [address] [place], [place] [address]    Report Status 09/04/2023 FINAL  Final  Blood Culture (routine x 2)     Status: None   Collection Time: 08/30/23  6:05 PM   Specimen: BLOOD LEFT ARM  Result Value Ref Range Status   Specimen Description BLOOD LEFT ARM  Final   Special  Requests   Final    BOTTLES DRAWN AEROBIC AND ANAEROBIC Blood Culture adequate volume   Culture   Final    NO GROWTH 5 DAYS Performed at [facility], [address], [place], [place] [address]    Report Status 09/04/2023 FINAL  Final  Respiratory (~20 pathogens) panel by PCR     Status: None   Collection Time: 08/31/23  8:43 AM   Specimen: Nasopharyngeal Swab; Respiratory  Result Value Ref Range Status   Adenovirus NOT DETECTED NOT DETECTED Final   Coronavirus 229E NOT DETECTED NOT DETECTED Final    Comment: (NOTE) The Coronavirus on the Respiratory Panel, DOES NOT test for the novel  Coronavirus (2019 nCoV)    Coronavirus HKU1 NOT DETECTED NOT DETECTED Final   Coronavirus NL63 NOT DETECTED NOT DETECTED Final   Coronavirus OC43 NOT DETECTED NOT DETECTED Final   Metapneumovirus NOT DETECTED NOT DETECTED Final   Rhinovirus / Enterovirus NOT DETECTED NOT DETECTED Final   Influenza A NOT DETECTED NOT DETECTED Final   Influenza B NOT DETECTED NOT DETECTED Final   Parainfluenza Virus 1 NOT DETECTED NOT DETECTED Final   Parainfluenza Virus 2 NOT DETECTED NOT DETECTED Final   Parainfluenza Virus 3 NOT DETECTED NOT DETECTED Final   Parainfluenza Virus 4 NOT DETECTED NOT DETECTED Final   Respiratory Syncytial Virus NOT DETECTED NOT DETECTED Final  Bordetella pertussis NOT DETECTED NOT DETECTED Final   Bordetella Parapertussis NOT DETECTED NOT DETECTED Final   Chlamydophila pneumoniae NOT DETECTED NOT DETECTED Final   Mycoplasma pneumoniae NOT DETECTED NOT DETECTED Final    Comment: Performed at [facility], [address], [place], [place] [address]  MRSA Next Gen by PCR, Nasal     Status: None   Collection Time: 08/31/23 11:27 AM  Result Value Ref Range Status   MRSA by PCR Next Gen NOT DETECTED NOT DETECTED Final    Comment: (NOTE) The GeneXpert MRSA Assay (FDA approved for NASAL specimens only), is one component of a comprehensive MRSA colonization  surveillance program. It is not intended to diagnose MRSA infection nor to guide or monitor treatment for MRSA infections. Test performance is not FDA approved in patients less than 71 years old. Performed at [facility], [address] [place], [place] [address]   Blastomyces Antigen     Status: None   Collection Time: 08/31/23  1:39 PM   Specimen: Blood  Result Value Ref Range Status   Blastomyces Antigen None Detected None Detected ng/mL Final    Comment: (NOTE) Reference Interval: None Detected Reportable Range: 0.31 ng/mL - 20.00 ng/mL Results above 20.00 ng/mL are reported as 'Positive, Above the Limit of Quantification' This test was developed and its performance characteristics determined by [employer]. It has not been cleared or approved by the FDA; however, FDA clearance or approval is not currently required for clinical use. The results are not intended to be used as the sole means for clinical diagnosis or patient decisions.    Interpretation Negative  Final   Specimen Type SERUM  Final    Comment: (NOTE) Performed At: [facility] [address] [place], [place] [id number] [place] Pac MD Ey:[id number]   Expectorated Sputum Assessment w Gram Stain, Rflx to Resp Cult     Status: None   Collection Time: 08/31/23  5:39 PM   Specimen: Expectorated Sputum  Result Value Ref Range Status   Specimen Description EXPECTORATED SPUTUM  Final   Special Requests NONE  Final   Sputum evaluation   Final    THIS SPECIMEN IS ACCEPTABLE FOR SPUTUM CULTURE Performed at [facility], [address], [place], [place] [address]    Report Status 09/02/2023 FINAL  Final  Culture, Respiratory w Gram Stain     Status: None (Preliminary result)   Collection Time: 08/31/23  5:39 PM  Result Value Ref Range Status   Specimen Description EXPECTORATED SPUTUM  Final   Special Requests NONE Reflexed from [id number]  Final   Gram Stain   Final    ABUNDANT  WBC PRESENT, PREDOMINANTLY PMN RARE GRAM POSITIVE COCCI RARE GRAM NEGATIVE RODS    Culture   Final    TOO YOUNG TO READ Performed at [facility], [address], [place], [place] [address]    Report Status PENDING  Incomplete  MTB-RIF NAA with AFB Culture, sputum (q8 x 3)     Status: None (Preliminary result)   Collection Time: 09/01/23  4:39 AM   Specimen: Sputum  Result Value Ref Range Status   Myco tuberculosis Complex NOT DETECTED NOT DETECTED Final   Rifampin Not applicable NOT DETECTED Final   AFB Specimen Processing Concentration  Final    Comment: (NOTE) Performed At: [facility] [address] [place], [place] [id number] Jennette Shorter MD Ey:[id number]    Acid Fast Culture PENDING  Incomplete  Source (MTB RIF) EXPECTORATED SPUTUM  Final    Comment: Performed at [facility], [address], [place], [place] [address]  Expectorated Sputum Assessment w Gram Stain, Rflx to Resp Cult     Status: None   Collection Time: 09/01/23  8:45 AM   Specimen: Expectorated Sputum  Result Value Ref Range Status   Specimen Description EXPECTORATED SPUTUM  Final   Special Requests NONE  Final   Sputum evaluation   Final    THIS SPECIMEN IS ACCEPTABLE FOR SPUTUM CULTURE Performed at [facility], [address], [place], [place] [address]    Report Status 09/01/2023 FINAL  Final  MTB-RIF NAA with AFB Culture, sputum (q8 x 3)     Status: None (Preliminary result)   Collection Time: 09/01/23  8:45 AM   Specimen: Sputum  Result Value Ref Range Status   Myco tuberculosis Complex NOT DETECTED NOT DETECTED Final   Rifampin Not applicable NOT DETECTED Final   AFB Specimen Processing Concentration  Final    Comment: (NOTE) Performed At: [facility] [address] [place], [place] [id number] Jennette Shorter MD Ey:[id number]    Acid Fast Culture PENDING  Incomplete   Source (MTB RIF) SPU  Final    Comment: Performed at [facility], [address] [place], [place] [address]  Culture, Respiratory w Gram Stain     Status: None (Preliminary result)   Collection Time: 09/01/23  8:45 AM  Result Value Ref Range Status   Specimen Description EXPECTORATED SPUTUM  Final   Special Requests NONE Reflexed from [id number]  Final   Gram Stain   Final    FEW WBC PRESENT, PREDOMINANTLY PMN MODERATE GRAM NEGATIVE RODS RARE GRAM POSITIVE COCCI IN CHAINS    Culture   Final    FEW STAPHYLOCOCCUS AUREUS SUSCEPTIBILITIES TO FOLLOW Performed at [facility], [address], [place], [place] [address]    Report Status PENDING  Incomplete     Labs:   Basic Metabolic Panel: Recent Labs  Lab 08/30/23 1805 08/30/23 1808 08/30/23 1814 08/31/23 0250 09/02/23 0500 09/03/23 0530  NA 132*  --  133* 138 141 140  K 3.5  --  3.5 3.8 3.4* 3.5  CL 94*  --  95* 101 106 106  CO2 21*  --   --  23 28 25   GLUCOSE 82  --  84 84 101* 91  BUN <5*  --  3* <5* <5* <5*  CREATININE 1.00  --  0.70 0.88 0.67 0.70  CALCIUM 8.5*  --   --  8.2* 8.6* 8.5*  MG  --  2.0  --  1.9 2.1  --   PHOS  --   --   --  2.7  --   --    Liver Function Tests: Recent Labs  Lab 08/30/23 1805 08/31/23 0250 09/02/23 0500 09/03/23 0530  AST 16 13* 15 16  ALT 7 7 7 7   ALKPHOS 39 31* 32* 30*  BILITOT 1.3* 0.8 0.3 0.3  PROT 7.3 6.3* 6.0* 6.2*  ALBUMIN  3.0* 2.6* 2.5* 2.5*    CBC: Recent Labs  Lab 08/30/23 1805 08/30/23 1814 08/31/23 0250 09/02/23 0500 09/03/23 0530 09/03/23 1310  WBC 8.8  --  4.1 1.7* 1.5*  --   NEUTROABS 7.1  --  3.1  --   --   --   HGB 10.6* 11.9* 10.1* 11.1* 9.1* 9.1*  HCT 33.5* 35.0* 31.9* 35.0* 28.3* 28.9*  MCV  82.3  --  83.3 82.9 81.6  --   PLT 337  --  292 267 322  --      IMAGING STUDIES US  EKG SITE RITE Result Date: 09/01/2023 If Site Rite image not attached, placement could not be confirmed due to current cardiac rhythm.  MR BRAIN W WO CONTRAST Result Date: 08/31/2023 CLINICAL DATA:  aids; headache EXAM: MRI HEAD WITHOUT AND WITH  CONTRAST TECHNIQUE: Multiplanar, multiecho pulse sequences of the brain and surrounding structures were obtained without and with intravenous contrast. CONTRAST:  4mL GADAVIST  GADOBUTROL  1 MMOL/ML IV SOLN COMPARISON:  MRI of the head dated July 28, 2017. FINDINGS: Brain: There is mild cerebral volume loss present. The brain is unremarkable. There is no evidence of hemorrhage, mass, cortical infarct or hydrocephalus. There is no abnormal parenchymal or meningeal enhancement. Vascular: Normal flow voids. Skull and upper cervical spine: No osseous lesions. Sinuses/Orbits: Moderate opacification of the maxillary sinuses. There is also a ethmoid air cell disease and mild mucosal disease within the sphenoid sinuses. The orbits are unremarkable. Other: None. IMPRESSION: 1. Normal brain. 2. Mild-to-moderate paranasal sinus disease. Electronically Signed   By: Evalene Coho M.D.   On: 08/31/2023 17:02   CT Angio Chest PE W and/or Wo Contrast Result Date: 08/30/2023 CLINICAL DATA:  Tachycardia, dyspnea, factor 5 Leiden hypercoagulability EXAM: CT ANGIOGRAPHY CHEST WITH CONTRAST TECHNIQUE: Multidetector CT imaging of the chest was performed using the standard protocol during bolus administration of intravenous contrast. Multiplanar CT image reconstructions and MIPs were obtained to evaluate the vascular anatomy. RADIATION DOSE REDUCTION: This exam was performed according to the departmental dose-optimization program which includes automated exposure control, adjustment of the mA and/or kV according to patient size and/or use of iterative reconstruction technique. CONTRAST:  75mL OMNIPAQUE  IOHEXOL  350 MG/ML SOLN COMPARISON:  None Available. FINDINGS: Cardiovascular: Adequate opacification of the pulmonary arterial tree. No intraluminal filling defect identified to suggest acute pulmonary embolism. Central pulmonary arteries are of normal caliber. No significant coronary artery calcification. Cardiac size is within normal  limits. No pericardial effusion. No signet atherosclerotic calcification within the thoracic aorta. No aortic aneurysm. Mediastinum/Nodes: Shotty left hilar and aortopulmonary window lymphadenopathy is likely reactive in nature. No frankly pathologic thoracic adenopathy. Visualized thyroid  is unremarkable. Esophagus is unremarkable. Lungs/Pleura: There is dense consolidation of the left lower lobe with extensive airway impaction. Scatter nodular airspace infiltrate within the lingula and to a lesser extent right lower lobe most in keeping with changes acute infection in the appropriate clinical setting. No pneumothorax or pleural effusion. Upper Abdomen: No acute abnormality. Musculoskeletal: No chest wall abnormality. No acute or significant osseous findings. Review of the MIP images confirms the above findings. IMPRESSION: 1. No evidence of acute pulmonary embolism. 2. Dense consolidation of the left lower lobe with extensive airway impaction. Scatter nodular airspace infiltrate within the lingula and to a lesser extent right lower lobe most in keeping with changes acute infection in the appropriate clinical setting. Electronically Signed   By: Dorethia Molt M.D.   On: 08/30/2023 19:53   DG Chest Port 1 View Result Date: 08/30/2023 CLINICAL DATA:  Question of sepsis to evaluate for abnormality. Shortness of breath and dizziness ongoing. Recent diagnosis of pneumonia with outpatient antibiotic treatment without relief. EXAM: PORTABLE CHEST 1 VIEW COMPARISON:  08/18/2023 FINDINGS: Heart size and pulmonary vascularity are normal. Emphysematous changes in the lungs. There is volume loss and consolidation in the left lower lung, new since prior study. This likely represents pneumonia given the clinical  symptoms. Follow-up to resolution is recommended to exclude underlying obstructing neoplasm. Right lung is clear. No pleural effusion. No pneumothorax. Mediastinal contours appear intact. IMPRESSION: Left lower lung  volume loss and consolidation likely pneumonia. Follow-up to resolution is recommended to exclude underlying obstructing neoplasm. Electronically Signed   By: Elsie Gravely M.D.   On: 08/30/2023 16:58    DISCHARGE EXAMINATION: See progress note from earlier today   DISPOSITION: Home  Discharge Instructions     Call MD for:  difficulty breathing, headache or visual disturbances   Complete by: As directed    Call MD for:  extreme fatigue   Complete by: As directed    Call MD for:  persistant dizziness or light-headedness   Complete by: As directed    Call MD for:  persistant nausea and vomiting   Complete by: As directed    Call MD for:  severe uncontrolled pain   Complete by: As directed    Call MD for:  temperature >100.4   Complete by: As directed    Diet general   Complete by: As directed    Discharge instructions   Complete by: As directed    Take your medications as prescribed. ID will arrange outpatient follow up.   You were cared for by a hospitalist during your hospital stay. If you have any questions about your discharge medications or the care you received while you were in the hospital after you are discharged, you can call the unit and asked to speak with the hospitalist on call if the hospitalist that took care of you is not available. Once you are discharged, your primary care physician will handle any further medical issues. Please note that NO REFILLS for any discharge medications will be authorized once you are discharged, as it is imperative that you return to your primary care physician (or establish a relationship with a primary care physician if you do not have one) for your aftercare needs so that they can reassess your need for medications and monitor your lab values. If you do not have a primary care physician, you can call [phone] for a physician referral.   Increase activity slowly   Complete by: As directed          Allergies as of 09/03/2023        Reactions   Levaquin [levofloxacin In D5w] Shortness Of Breath, Rash   Zithromax [azithromycin] Shortness Of Breath, Swelling, Other (See Comments)   Pt reports tightness in skin with redness   Bactrim [sulfamethoxazole-trimethoprim] Swelling, Other (See Comments)   Pt reports that she was placed on Bactrim for pneumonia when she was d/c from [facility] and developed a red face with swelling and can not tolerate Bactrim.    Sulfa Antibiotics Swelling   Facial swelling and burning   Vancomycin  Other (See Comments)   Intolerance, pt states that as long as med is administered with Benadryl  its tolarable   Darunavir  Rash        Medication List     STOP taking these medications    cefdinir  300 MG capsule Commonly known as: OMNICEF    predniSONE  20 MG tablet Commonly known as: DELTASONE        TAKE these medications    albuterol  108 (90 Base) MCG/ACT inhaler Commonly known as: VENTOLIN  HFA Inhale 2 puffs into the lungs every 6 (six) hours as needed for wheezing or shortness of breath.   amoxicillin -clavulanate 875-125 MG tablet Commonly known as: AUGMENTIN  Take 1 tablet by mouth  every 12 (twelve) hours for 17 days.   benzonatate  200 MG capsule Commonly known as: TESSALON  Take 1 capsule (200 mg total) by mouth 3 (three) times daily as needed for cough.   Biktarvy  50-200-25 MG Tabs tablet Generic drug: bictegravir-emtricitabine -tenofovir  AF Take 1 tablet by mouth daily.   buprenorphine  8 MG Subl SL tablet Commonly known as: SUBUTEX  Place 8 mg under the tongue 3 (three) times daily.   doxycycline  100 MG tablet Commonly known as: VIBRA -TABS Take 1 tablet (100 mg total) by mouth every 12 (twelve) hours for 3 days.   folic acid  1 MG tablet Commonly known as: FOLVITE  Take 1 tablet (1 mg total) by mouth daily.   midodrine  2.5 MG tablet Commonly known as: PROAMATINE  Take 1 tablet (2.5 mg total) by mouth 3 (three) times daily with meals.   ondansetron  4 MG  tablet Commonly known as: Zofran  Take 1-2 tablets 4 mg by mouth every 8 (eight) hours as needed for nausea or vomiting.          Follow-up Information     Melvenia Corean SAILOR, NP Follow up in 5 day(s).   Specialty: Infectious Diseases Why: Hospital follow up Contact information: [address] [place] [address] [phone]                 TOTAL DISCHARGE TIME: 35 minutes  Nashya Garlington Verdene  [facility] Pager on [email or web address]  09/04/2023, 11:00 AM

## 2023-09-03 NOTE — Plan of Care (Signed)

## 2023-09-03 NOTE — Progress Notes (Signed)
 [facility] PROGRESS NOTE   Frances Ball FMW:[id number] DOB: [birth year] DOA: 08/30/2023  PCP: Melvenia Corean SAILOR, NP  Brief History: Patient with PMH of HIV noncompliant with medicines, chronic bronchiectasis, anemia, hep C, failure to thrive presented to the hospital with complaints of cough shortness of breath chest pain as well as weight loss.  Thought to have pneumonia.  Hospitalized for further management.  Consultants: Infectious disease.  Pulmonology.  Procedures: None yet    Subjective/Interval History: Patient without any new complaints this morning.  Cough is about the same.     Assessment/Plan:  Recurrent pneumonia. Chronic bronchiectasis. Presented from nursing cough and shortness of breath as well as left-sided chest pain. Symptoms have been ongoing for last few months. Was treated for pneumonia in April, early July. CT scan shows unresolving pneumonia. Patient reports weight loss as well as fever with a temp going up to 104 few days ago. ID was consulted.  Pulmonology also consulted. Patient currently on cefepime  and doxycycline .  Sputum cultures pending.  AFB evaluation pending.  QuantiFERON pending. Respiratory status is stable. ID to determine final antibiotic plan.  As of yesterday they were planning to give 3 weeks of atypical bacterial pneumonia coverage but only 1 week of doxycycline .  Await their input today.  Unintentional weight loss. Failure to thrive. In the setting of infection.  Leukopenia Likely secondary to HIV status as well as active infection.  She is afebrile.  Will need outpatient monitoring.   Hyponatremia. Resolved   Anion gap acidosis. Improvement noted.   Chronic hypotension. Systolic BP in 90s. Improving gentle IV hydration.  Also on midodrine .  Patient is chronically on this medication.   Headache. Right brain was unremarkable.  Normocytic anemia No evidence of overt bleeding.  Continue to monitor.   History  of HIV and AIDS. ID is following.  Patient has been started on Biktarvy .   Chronic pain syndrome. Home regimen includes Suboxone /buprenorphine  8 mg tablet 3 times daily.  Prescriber database was reviewed.  Suboxone  was last prescribed on 08/10/2023.  90 tablets were prescribed at that time.   DVT Prophylaxis: Patient has been refusing Lovenox  despite education. Code Status: Full code Family Communication: Discussed with patient Disposition Plan: Hopefully return home when improved.  Mobilize.    Medications: Scheduled:  bictegravir-emtricitabine -tenofovir  AF  1 tablet Oral Daily   buprenorphine   8 mg Sublingual TID   doxycycline   100 mg Oral Q12H   enoxaparin  (LOVENOX ) injection  30 mg Subcutaneous Q24H   folic acid   1 mg Oral Daily   guaiFENesin   600 mg Oral BID   midodrine   2.5 mg Oral TID WC   sodium chloride  flush  10-40 mL Intracatheter Q12H   sodium chloride  HYPERTONIC  4 mL Nebulization BID   Continuous:  ceFEPime  (MAXIPIME ) IV 2 g (09/03/23 0759)   PRN:acetaminophen  **OR** acetaminophen , albuterol , benzonatate , melatonin, ondansetron  (ZOFRAN ) IV **OR** ondansetron , mouth rinse, sodium chloride  flush  Antibiotics: Anti-infectives (From admission, onward)    Start     Dose/Rate Route Frequency Ordered Stop   09/02/23 1930  bictegravir-emtricitabine -tenofovir  AF (BIKTARVY ) 50-200-25 MG per tablet 1 tablet        1 tablet Oral Daily 09/02/23 0844     09/02/23 1000  bictegravir-emtricitabine -tenofovir  AF (BIKTARVY ) 50-200-25 MG per tablet 1 tablet  Status:  Discontinued        1 tablet Oral Daily 09/02/23 0003 09/02/23 0844   08/31/23 2300  ceFEPIme  (MAXIPIME ) 2 g in sodium chloride  0.9 % 100 mL IVPB  2 g 200 mL/hr over 30 Minutes Intravenous Every 8 hours 08/31/23 1801     08/31/23 2200  doxycycline  (VIBRA -TABS) tablet 100 mg        100 mg Oral Every 12 hours 08/31/23 1440     08/31/23 1830  ceFEPIme  (MAXIPIME ) 2 g in sodium chloride  0.9 % 100 mL IVPB  Status:   Discontinued        2 g 200 mL/hr over 30 Minutes Intravenous Every 8 hours 08/31/23 1741 08/31/23 1801   08/31/23 1530  amoxicillin -clavulanate (AUGMENTIN ) 875-125 MG per tablet 1 tablet  Status:  Discontinued        1 tablet Oral Every 12 hours 08/31/23 1440 08/31/23 1738   08/31/23 0945  bictegravir-emtricitabine -tenofovir  AF (BIKTARVY ) 50-200-25 MG per tablet 1 tablet  Status:  Discontinued        1 tablet Oral Daily at bedtime 08/31/23 0945 08/31/23 1419   08/31/23 0800  doxycycline  (VIBRAMYCIN ) 100 mg in sodium chloride  0.9 % 250 mL IVPB  Status:  Discontinued        100 mg 125 mL/hr over 120 Minutes Intravenous Every 12 hours 08/30/23 2107 08/31/23 1440   08/30/23 2115  ceFEPIme  (MAXIPIME ) 2 g in sodium chloride  0.9 % 100 mL IVPB  Status:  Discontinued        2 g 200 mL/hr over 30 Minutes Intravenous Every 8 hours 08/30/23 2110 08/31/23 1440   08/30/23 2100  bictegravir-emtricitabine -tenofovir  AF (BIKTARVY ) 50-200-25 MG per tablet 1 tablet  Status:  Discontinued        1 tablet Oral Daily 08/30/23 2052 08/31/23 0945   08/30/23 2045  doxycycline  (VIBRAMYCIN ) 100 mg in sodium chloride  0.9 % 250 mL IVPB        100 mg 125 mL/hr over 120 Minutes Intravenous  Once 08/30/23 2032 08/30/23 2335   08/30/23 1830  cefTRIAXone  (ROCEPHIN ) 2 g in sodium chloride  0.9 % 100 mL IVPB        2 g 200 mL/hr over 30 Minutes Intravenous Once 08/30/23 1823 08/30/23 1917       Objective:  Vital Signs  Vitals:   09/02/23 1530 09/02/23 1923 09/03/23 0701 09/03/23 0842  BP: 93/67 100/65 91/63   Pulse: 82 78 91 85  Resp: 17  16 18   Temp: 98.1 F (36.7 C) 98 F (36.7 C) 98.2 F (36.8 C)   TempSrc: Oral Oral Oral   SpO2: 96% 97% 95%   Weight:   44.9 kg   Height:        Intake/Output Summary (Last 24 hours) at 09/03/2023 1042 Last data filed at 09/03/2023 0300 Gross per 24 hour  Intake 540 ml  Output --  Net 540 ml   Filed Weights   09/01/23 0506 09/02/23 0434 09/03/23 0701  Weight: 45.3 kg  44.5 kg 44.9 kg    General appearance: Awake alert.  In no distress Resp: Improved air entry bilaterally. Cardio: S1-S2 is normal regular.  No S3-S4.  No rubs murmurs or bruit GI: Abdomen is soft.  Nontender nondistended.  Bowel sounds are present normal.  No masses organomegaly  Lab Results:  Data Reviewed: I have personally reviewed following labs and reports of the imaging studies  CBC: Recent Labs  Lab 08/30/23 1805 08/30/23 1814 08/31/23 0250 09/02/23 0500 09/03/23 0530  WBC 8.8  --  4.1 1.7* 1.5*  NEUTROABS 7.1  --  3.1  --   --   HGB 10.6* 11.9* 10.1* 11.1* 9.1*  HCT 33.5* 35.0* 31.9* 35.0* 28.3*  MCV 82.3  --  83.3 82.9 81.6  PLT 337  --  292 267 322    Basic Metabolic Panel: Recent Labs  Lab 08/30/23 1805 08/30/23 1808 08/30/23 1814 08/31/23 0250 09/02/23 0500 09/03/23 0530  NA 132*  --  133* 138 141 140  K 3.5  --  3.5 3.8 3.4* 3.5  CL 94*  --  95* 101 106 106  CO2 21*  --   --  23 28 25   GLUCOSE 82  --  84 84 101* 91  BUN <5*  --  3* <5* <5* <5*  CREATININE 1.00  --  0.70 0.88 0.67 0.70  CALCIUM 8.5*  --   --  8.2* 8.6* 8.5*  MG  --  2.0  --  1.9 2.1  --   PHOS  --   --   --  2.7  --   --     GFR: Estimated Creatinine Clearance: 70.2 mL/min (by C-G formula based on SCr of 0.7 mg/dL).  Liver Function Tests: Recent Labs  Lab 08/30/23 1805 08/31/23 0250 09/02/23 0500 09/03/23 0530  AST 16 13* 15 16  ALT 7 7 7 7   ALKPHOS 39 31* 32* 30*  BILITOT 1.3* 0.8 0.3 0.3  PROT 7.3 6.3* 6.0* 6.2*  ALBUMIN  3.0* 2.6* 2.5* 2.5*    Coagulation Profile: Recent Labs  Lab 08/30/23 1805  INR 1.1     Recent Results (from the past 240 hours)  Resp panel by RT-PCR (RSV, Flu A&B, Covid) Anterior Nasal Swab     Status: None   Collection Time: 08/30/23  4:20 PM   Specimen: Anterior Nasal Swab  Result Value Ref Range Status   SARS Coronavirus 2 by RT PCR NEGATIVE NEGATIVE Final   Influenza A by PCR NEGATIVE NEGATIVE Final   Influenza B by PCR NEGATIVE  NEGATIVE Final    Comment: (NOTE) The Xpert Xpress SARS-CoV-2/FLU/RSV plus assay is intended as an aid in the diagnosis of influenza from Nasopharyngeal swab specimens and should not be used as a sole basis for treatment. Nasal washings and aspirates are unacceptable for Xpert Xpress SARS-CoV-2/FLU/RSV testing.  Fact Sheet for Patients: BloggerCourse.com  Fact Sheet for Healthcare Providers: SeriousBroker.it  This test is not yet approved or cleared by the [place]  FDA and has been authorized for detection and/or diagnosis of SARS-CoV-2 by FDA under an Emergency Use Authorization (EUA). This EUA will remain in effect (meaning this test can be used) for the duration of the COVID-19 declaration under Section 564(b)(1) of the Act, 21 U.S.C. section 360bbb-3(b)(1), unless the authorization is terminated or revoked.     Resp Syncytial Virus by PCR NEGATIVE NEGATIVE Final    Comment: (NOTE) Fact Sheet for Patients: BloggerCourse.com  Fact Sheet for Healthcare Providers: SeriousBroker.it  This test is not yet approved or cleared by the [place]  FDA and has been authorized for detection and/or diagnosis of SARS-CoV-2 by FDA under an Emergency Use Authorization (EUA). This EUA will remain in effect (meaning this test can be used) for the duration of the COVID-19 declaration under Section 564(b)(1) of the Act, 21 U.S.C. section 360bbb-3(b)(1), unless the authorization is terminated or revoked.  Performed at [facility], [address], [place], [place] [address]   Blood Culture (routine x 2)     Status: None (Preliminary result)   Collection Time: 08/30/23  4:25 PM   Specimen: BLOOD LEFT ARM  Result Value Ref Range Status   Specimen Description BLOOD LEFT ARM  Final   Special Requests   Final    BOTTLES DRAWN AEROBIC AND ANAEROBIC Blood Culture results may not be  optimal due to an inadequate volume of blood received in culture bottles   Culture   Final    NO GROWTH 4 DAYS Performed at [facility], [address], [place], [place] [address]    Report Status PENDING  Incomplete  Blood Culture (routine x 2)     Status: None (Preliminary result)   Collection Time: 08/30/23  6:05 PM   Specimen: BLOOD LEFT ARM  Result Value Ref Range Status   Specimen Description BLOOD LEFT ARM  Final   Special Requests   Final    BOTTLES DRAWN AEROBIC AND ANAEROBIC Blood Culture adequate volume   Culture   Final    NO GROWTH 4 DAYS Performed at [facility], [address], [place], [place] [address]    Report Status PENDING  Incomplete  Respiratory (~20 pathogens) panel by PCR     Status: None   Collection Time: 08/31/23  8:43 AM   Specimen: Nasopharyngeal Swab; Respiratory  Result Value Ref Range Status   Adenovirus NOT DETECTED NOT DETECTED Final   Coronavirus 229E NOT DETECTED NOT DETECTED Final    Comment: (NOTE) The Coronavirus on the Respiratory Panel, DOES NOT test for the novel  Coronavirus (2019 nCoV)    Coronavirus HKU1 NOT DETECTED NOT DETECTED Final   Coronavirus NL63 NOT DETECTED NOT DETECTED Final   Coronavirus OC43 NOT DETECTED NOT DETECTED Final   Metapneumovirus NOT DETECTED NOT DETECTED Final   Rhinovirus / Enterovirus NOT DETECTED NOT DETECTED Final   Influenza A NOT DETECTED NOT DETECTED Final   Influenza B NOT DETECTED NOT DETECTED Final   Parainfluenza Virus 1 NOT DETECTED NOT DETECTED Final   Parainfluenza Virus 2 NOT DETECTED NOT DETECTED Final   Parainfluenza Virus 3 NOT DETECTED NOT DETECTED Final   Parainfluenza Virus 4 NOT DETECTED NOT DETECTED Final   Respiratory Syncytial Virus NOT DETECTED NOT DETECTED Final   Bordetella pertussis NOT DETECTED NOT DETECTED Final   Bordetella Parapertussis NOT DETECTED NOT DETECTED Final   Chlamydophila pneumoniae NOT DETECTED NOT DETECTED Final   Mycoplasma pneumoniae NOT  DETECTED NOT DETECTED Final    Comment: Performed at [facility], [address], [place], [place] [address]  MRSA Next Gen by PCR, Nasal     Status: None   Collection Time: 08/31/23 11:27 AM  Result Value Ref Range Status   MRSA by PCR Next Gen NOT DETECTED NOT DETECTED Final    Comment: (NOTE) The GeneXpert MRSA Assay (FDA approved for NASAL specimens only), is one component of a comprehensive MRSA colonization surveillance program. It is not intended to diagnose MRSA infection nor to guide or monitor treatment for MRSA infections. Test performance is not FDA approved in patients less than 50 years old. Performed at [facility], [address] [place], [place] [address]   Blastomyces Antigen     Status: None   Collection Time: 08/31/23  1:39 PM   Specimen: Blood  Result Value Ref Range Status   Blastomyces Antigen None Detected None Detected ng/mL Final    Comment: (NOTE) Reference Interval: None Detected Reportable Range: 0.31 ng/mL - 20.00 ng/mL Results above 20.00 ng/mL are reported as 'Positive, Above the Limit of Quantification' This test was developed and its performance characteristics determined by [employer]. It has not been cleared or approved by the FDA; however,  FDA clearance or approval is not currently required for clinical use. The results are not intended to be used as the sole means for clinical diagnosis or patient decisions.    Interpretation Negative  Final   Specimen Type SERUM  Final    Comment: (NOTE) Performed At: [facility] [address] [place], [place] [id number] [place] Pac MD Ey:[id number]   Expectorated Sputum Assessment w Gram Stain, Rflx to Resp Cult     Status: None   Collection Time: 08/31/23  5:39 PM   Specimen: Expectorated Sputum  Result Value Ref Range Status   Specimen Description EXPECTORATED SPUTUM  Final   Special Requests NONE  Final   Sputum evaluation   Final    THIS SPECIMEN IS  ACCEPTABLE FOR SPUTUM CULTURE Performed at [facility], [address] [place], [place] [address]    Report Status 09/02/2023 FINAL  Final  Culture, Respiratory w Gram Stain     Status: None (Preliminary result)   Collection Time: 08/31/23  5:39 PM  Result Value Ref Range Status   Specimen Description EXPECTORATED SPUTUM  Final   Special Requests NONE Reflexed from [id number]  Final   Gram Stain   Final    ABUNDANT WBC PRESENT, PREDOMINANTLY PMN RARE GRAM POSITIVE COCCI RARE GRAM NEGATIVE RODS Performed at [facility], [address], [place], [place] [address]    Culture PENDING  Incomplete   Report Status PENDING  Incomplete  Expectorated Sputum Assessment w Gram Stain, Rflx to Resp Cult     Status: None   Collection Time: 09/01/23  8:45 AM   Specimen: Expectorated Sputum  Result Value Ref Range Status   Specimen Description EXPECTORATED SPUTUM  Final   Special Requests NONE  Final   Sputum evaluation   Final    THIS SPECIMEN IS ACCEPTABLE FOR SPUTUM CULTURE Performed at [facility], [address] [place], [place] [address]    Report Status 09/01/2023 FINAL  Final  MTB-RIF NAA with AFB Culture, sputum (q8 x 3)     Status: None (Preliminary result)   Collection Time: 09/01/23  8:45 AM   Specimen: Sputum  Result Value Ref Range Status   Myco tuberculosis Complex PENDING  Incomplete   Rifampin PENDING  Incomplete   AFB Specimen Processing Concentration  Final    Comment: (NOTE) Performed At: [facility] [address] [place], [place] [id number] Jennette Shorter MD Ey:[id number]    Acid Fast Culture PENDING  Incomplete   Source (MTB RIF) SPU  Final    Comment: Performed at [facility], [address], [place], [place] [address]  Culture, Respiratory w Gram Stain     Status: None (Preliminary result)   Collection Time: 09/01/23  8:45 AM  Result Value Ref Range Status   Specimen Description EXPECTORATED SPUTUM  Final   Special Requests NONE  Reflexed from [id number]  Final   Gram Stain   Final    FEW WBC PRESENT, PREDOMINANTLY PMN MODERATE GRAM NEGATIVE RODS RARE GRAM POSITIVE COCCI IN CHAINS    Culture   Final    FEW STAPHYLOCOCCUS AUREUS CULTURE REINCUBATED FOR BETTER GROWTH Performed at [facility], [address], [place], [place] [address]    Report Status PENDING  Incomplete      Radiology Studies: US  EKG SITE RITE Result Date: 09/01/2023 If Site Rite image not attached, placement could not be confirmed due to current cardiac rhythm.  LOS: 4 days   Anecia Nusbaum [employer] on [email or web address]  09/03/2023, 10:42 AM

## 2023-09-03 NOTE — Plan of Care (Signed)

## 2023-09-03 NOTE — Progress Notes (Signed)
 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, admitted for several weeks of progressive dyspnea, not responding to pneumonia treatment in setting of LLL dense airspace consolidation and 20 pound weight loss     Micro -- Resp viral pcr negative Crypto ag negative Blasto, histo in progress  7/31 afb cx ip #3 7/30 mtb pcr #1 ip 7/30  mtb pcr #2 ip 7/30 sputum cx gnr, rare staph aureus 7/30 AFB expectorated sputum ip #1 and #2  7/29 quantiferon gold indeterminate 7/29 mrsa nares pcr negative 7/28 blood cx ngtd    #pna #weight loss -she was on biktarvy  until a few months ago and was taking it with good compliance and so the weight loss is concerning as not likely from hiv standpoint -ddx tb, endemic fungi. Rhodococcus/nocardia is less likely. Non-id cause include malignancy -she can produce sputum and will get afb cx -discussed with primary regarding non-id cause and team will discuss with pulmonology -would treat empirically for cap with perhaps a longer course while r/o tb -admission bcx ngtd and resp viral pcr negative   -pulm consulted and would like to cover empirically for pseudomonas as thinking she could have bronchiectasis flare. Respiratory cx no pseudomonas and will deescalate to without pseudomonal coverage    #quantiferon gold Indeterminate and unless the afb sputum remains negative, will recheck again to see if positive then latent tb needs to be treated     #hiv/aids #oi prophy Treatment experienced multiple regimen in the past including atripla, combivir /kaletra, isentress/prezista /norvir/viread and fuzeon, genovya (unsuppressed), biktarvy  pluse evotaz  then biktarvy  alone since 2020 Took biktarvy  with good  compliance but hasn't for the past few months.  Sees rcid Corean Fireman LOV 08/2022 Recent Labs       Lab Results  Component Value Date    HIV1RNAQUANT Not Detected 08/28/2022      Cd4 166 @ 08/2022 (13%) Brain mri unremarkable and crypto ag negative Start biktarvy  7/31  Do not suspect pjp and will her on bactrim prophy eventually -- pending respiratory cx. I do not suspect nocardia           #headache Left sided subacute. 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 Repeat hepatitis screening     #hcm Sent urine gc/chlam negative Rpr and repeat hepatitis panel --> ordered but not done and can be done outpatient     Plan: I have notify [facility] so they can follow her afb culture; discuss with patient to stay at home unless absolutely needed to go out to do chores/errands; wear masks She is getting better on abx so suspect another pna and agree that she'll need repeat chest ct in 2-3 months and if the same opacity seen again needs bronchoscopy for  biopsy ID clinic f/u arranged for 8/20 @ 230 Stop cefepime  Finish 1 week doxycycline  on 8/4 and finish 3 weeks amox/clav on 8/18 Continue biktarvy  - please give 1 month on hand Maintain negative airborne isolation Ok to discharge from id standpoint Discussed with triad      Principal Problem:   CAP (community acquired pneumonia) Active Problems:   HIV (human immunodeficiency virus infection) (HCC)   Acute hyponatremia   SOB (shortness of breath)   Anemia of chronic disease   High anion gap metabolic acidosis   Chronic pain syndrome   Allergies  Allergen Reactions   Levaquin [Levofloxacin In D5w] Shortness Of Breath and Rash   Zithromax [Azithromycin] Shortness Of Breath, Swelling and Other  (See Comments)    Pt reports tightness in skin with redness    Bactrim [Sulfamethoxazole-Trimethoprim] Swelling and Other (See Comments)    Pt reports that she was placed on Bactrim for pneumonia when she was d/c from [facility] and developed a red face with swelling and can not tolerate Bactrim.    Sulfa Antibiotics Swelling    Facial swelling and burning   Vancomycin  Other (See Comments)    Intolerance, pt states that as long as med is administered with Benadryl  its tolarable   Darunavir  Rash    Scheduled Meds:  amoxicillin -clavulanate  1 tablet Oral Q12H   bictegravir-emtricitabine -tenofovir  AF  1 tablet Oral Daily   buprenorphine   8 mg Sublingual TID   doxycycline   100 mg Oral Q12H   enoxaparin  (LOVENOX ) injection  30 mg Subcutaneous Q24H   folic acid   1 mg Oral Daily   guaiFENesin   600 mg Oral BID   midodrine   2.5 mg Oral TID WC   sodium chloride  flush  10-40 mL Intracatheter Q12H   sodium chloride  HYPERTONIC  4 mL Nebulization BID   Continuous Infusions:   PRN Meds:.acetaminophen  **OR** acetaminophen , albuterol , benzonatate , melatonin, ondansetron  (ZOFRAN ) IV **OR** ondansetron , mouth rinse, sodium chloride  flush   SUBJECTIVE: No complaint Feeling better with her dyspnea   Review of Systems: ROS All other ROS was negative, except mentioned above     OBJECTIVE: Vitals:   09/02/23 1923 09/03/23 0701 09/03/23 0842 09/03/23 1100  BP: 100/65 91/63    Pulse: 78 91 85   Resp:  16 18 18   Temp: 98 F (36.7 C) 98.2 F (36.8 C)  98.5 F (36.9 C)  TempSrc: Oral Oral  Oral  SpO2: 97% 95%    Weight:  44.9 kg    Height:       Body mass index is 18.11 kg/m.  Physical Exam General/constitutional: no distress, pleasant HEENT: Normocephalic, PER, Conj Clear, EOMI, Oropharynx clear Neck supple CV: rrr no mrg Lungs: clear to auscultation, normal respiratory effort Abd: Soft, Nontender Ext: no edema Skin: No Rash Neuro: nonfocal MSK: no peripheral joint  swelling/tenderness/warmth; back spines nontender     Lab Results Lab Results  Component Value Date   WBC 1.5 (L) 09/03/2023   HGB 9.1 (L) 09/03/2023   HCT 28.3 (L) 09/03/2023   MCV 81.6 09/03/2023   PLT 322 09/03/2023    Lab Results  Component Value Date   CREATININE 0.70 09/03/2023   BUN <5 (L) 09/03/2023   NA 140 09/03/2023   K 3.5 09/03/2023   CL 106 09/03/2023   CO2 25 09/03/2023    Lab Results  Component Value Date   ALT 7 09/03/2023   AST 16 09/03/2023   GGT 12 10/07/2017   ALKPHOS 30 (L) 09/03/2023  BILITOT 0.3 09/03/2023      Microbiology: Recent Results (from the past 240 hours)  Resp panel by RT-PCR (RSV, Flu A&B, Covid) Anterior Nasal Swab     Status: None   Collection Time: 08/30/23  4:20 PM   Specimen: Anterior Nasal Swab  Result Value Ref Range Status   SARS Coronavirus 2 by RT PCR NEGATIVE NEGATIVE Final   Influenza A by PCR NEGATIVE NEGATIVE Final   Influenza B by PCR NEGATIVE NEGATIVE Final    Comment: (NOTE) The Xpert Xpress SARS-CoV-2/FLU/RSV plus assay is intended as an aid in the diagnosis of influenza from Nasopharyngeal swab specimens and should not be used as a sole basis for treatment. Nasal washings and aspirates are unacceptable for Xpert Xpress SARS-CoV-2/FLU/RSV testing.  Fact Sheet for Patients: BloggerCourse.com  Fact Sheet for Healthcare Providers: SeriousBroker.it  This test is not yet approved or cleared by the [place]  FDA and has been authorized for detection and/or diagnosis of SARS-CoV-2 by FDA under an Emergency Use Authorization (EUA). This EUA will remain in effect (meaning this test can be used) for the duration of the COVID-19 declaration under Section 564(b)(1) of the Act, 21 U.S.C. section 360bbb-3(b)(1), unless the authorization is terminated or revoked.     Resp Syncytial Virus by PCR NEGATIVE NEGATIVE Final    Comment: (NOTE) Fact Sheet for  Patients: BloggerCourse.com  Fact Sheet for Healthcare Providers: SeriousBroker.it  This test is not yet approved or cleared by the [place]  FDA and has been authorized for detection and/or diagnosis of SARS-CoV-2 by FDA under an Emergency Use Authorization (EUA). This EUA will remain in effect (meaning this test can be used) for the duration of the COVID-19 declaration under Section 564(b)(1) of the Act, 21 U.S.C. section 360bbb-3(b)(1), unless the authorization is terminated or revoked.  Performed at [facility], [address], [place], [place] [address]   Blood Culture (routine x 2)     Status: None (Preliminary result)   Collection Time: 08/30/23  4:25 PM   Specimen: BLOOD LEFT ARM  Result Value Ref Range Status   Specimen Description BLOOD LEFT ARM  Final   Special Requests   Final    BOTTLES DRAWN AEROBIC AND ANAEROBIC Blood Culture results may not be optimal due to an inadequate volume of blood received in culture bottles   Culture   Final    NO GROWTH 4 DAYS Performed at [facility], [address] [place], [place] [address]    Report Status PENDING  Incomplete  Blood Culture (routine x 2)     Status: None (Preliminary result)   Collection Time: 08/30/23  6:05 PM   Specimen: BLOOD LEFT ARM  Result Value Ref Range Status   Specimen Description BLOOD LEFT ARM  Final   Special Requests   Final    BOTTLES DRAWN AEROBIC AND ANAEROBIC Blood Culture adequate volume   Culture   Final    NO GROWTH 4 DAYS Performed at [facility], [address] [place], [place] [address]    Report Status PENDING  Incomplete  Respiratory (~20 pathogens) panel by PCR     Status: None   Collection Time: 08/31/23  8:43 AM   Specimen: Nasopharyngeal Swab; Respiratory  Result Value Ref Range Status   Adenovirus NOT DETECTED NOT DETECTED Final   Coronavirus 229E NOT DETECTED NOT DETECTED Final    Comment: (NOTE) The  Coronavirus on the Respiratory Panel, DOES NOT test for the novel  Coronavirus (  2019 nCoV)    Coronavirus HKU1 NOT DETECTED NOT DETECTED Final   Coronavirus NL63 NOT DETECTED NOT DETECTED Final   Coronavirus OC43 NOT DETECTED NOT DETECTED Final   Metapneumovirus NOT DETECTED NOT DETECTED Final   Rhinovirus / Enterovirus NOT DETECTED NOT DETECTED Final   Influenza A NOT DETECTED NOT DETECTED Final   Influenza B NOT DETECTED NOT DETECTED Final   Parainfluenza Virus 1 NOT DETECTED NOT DETECTED Final   Parainfluenza Virus 2 NOT DETECTED NOT DETECTED Final   Parainfluenza Virus 3 NOT DETECTED NOT DETECTED Final   Parainfluenza Virus 4 NOT DETECTED NOT DETECTED Final   Respiratory Syncytial Virus NOT DETECTED NOT DETECTED Final   Bordetella pertussis NOT DETECTED NOT DETECTED Final   Bordetella Parapertussis NOT DETECTED NOT DETECTED Final   Chlamydophila pneumoniae NOT DETECTED NOT DETECTED Final   Mycoplasma pneumoniae NOT DETECTED NOT DETECTED Final    Comment: Performed at [facility], [address], [place], [place] [address]  MRSA Next Gen by PCR, Nasal     Status: None   Collection Time: 08/31/23 11:27 AM  Result Value Ref Range Status   MRSA by PCR Next Gen NOT DETECTED NOT DETECTED Final    Comment: (NOTE) The GeneXpert MRSA Assay (FDA approved for NASAL specimens only), is one component of a comprehensive MRSA colonization surveillance program. It is not intended to diagnose MRSA infection nor to guide or monitor treatment for MRSA infections. Test performance is not FDA approved in patients less than 56 years old. Performed at [facility], [address], [place], [place] [address]   Blastomyces Antigen     Status: None   Collection Time: 08/31/23  1:39 PM   Specimen: Blood  Result Value Ref Range Status   Blastomyces Antigen None Detected None Detected ng/mL Final    Comment: (NOTE) Reference Interval: None Detected Reportable Range: 0.31 ng/mL - 20.00  ng/mL Results above 20.00 ng/mL are reported as 'Positive, Above the Limit of Quantification' This test was developed and its performance characteristics determined by [employer]. It has not been cleared or approved by the FDA; however, FDA clearance or approval is not currently required for clinical use. The results are not intended to be used as the sole means for clinical diagnosis or patient decisions.    Interpretation Negative  Final   Specimen Type SERUM  Final    Comment: (NOTE) Performed At: [facility] [address] [place], [place] [id number] [place] Pac MD Ey:[id number]   Expectorated Sputum Assessment w Gram Stain, Rflx to Resp Cult     Status: None   Collection Time: 08/31/23  5:39 PM   Specimen: Expectorated Sputum  Result Value Ref Range Status   Specimen Description EXPECTORATED SPUTUM  Final   Special Requests NONE  Final   Sputum evaluation   Final    THIS SPECIMEN IS ACCEPTABLE FOR SPUTUM CULTURE Performed at [facility], [address] [place], [place] [address]    Report Status 09/02/2023 FINAL  Final  Culture, Respiratory w Gram Stain     Status: None (Preliminary result)   Collection Time: 08/31/23  5:39 PM  Result Value Ref Range Status   Specimen Description EXPECTORATED SPUTUM  Final   Special Requests NONE Reflexed from [id number]  Final   Gram Stain   Final    ABUNDANT WBC PRESENT, PREDOMINANTLY PMN RARE GRAM POSITIVE COCCI RARE GRAM NEGATIVE RODS    Culture   Final  TOO YOUNG TO READ Performed at [facility], [address], [place], [place] [address]    Report Status PENDING  Incomplete  Expectorated Sputum Assessment w Gram Stain, Rflx to Resp Cult     Status: None   Collection Time: 09/01/23  8:45 AM   Specimen: Expectorated Sputum  Result Value Ref Range Status   Specimen Description EXPECTORATED SPUTUM  Final   Special Requests NONE  Final   Sputum evaluation   Final    THIS SPECIMEN IS  ACCEPTABLE FOR SPUTUM CULTURE Performed at [facility], [address], [place], [place] [address]    Report Status 09/01/2023 FINAL  Final  MTB-RIF NAA with AFB Culture, sputum (q8 x 3)     Status: None (Preliminary result)   Collection Time: 09/01/23  8:45 AM   Specimen: Sputum  Result Value Ref Range Status   Myco tuberculosis Complex PENDING  Incomplete   Rifampin PENDING  Incomplete   AFB Specimen Processing Concentration  Final    Comment: (NOTE) Performed At: [facility] [address] [place], [place] [id number] Jennette Shorter MD Ey:[id number]    Acid Fast Culture PENDING  Incomplete   Source (MTB RIF) SPU  Final    Comment: Performed at [facility], [address], [place], [place] [address]  Culture, Respiratory w Gram Stain     Status: None (Preliminary result)   Collection Time: 09/01/23  8:45 AM  Result Value Ref Range Status   Specimen Description EXPECTORATED SPUTUM  Final   Special Requests NONE Reflexed from [phone]  Final   Gram Stain   Final    FEW WBC PRESENT, PREDOMINANTLY PMN MODERATE GRAM NEGATIVE RODS RARE GRAM POSITIVE COCCI IN CHAINS    Culture   Final    FEW STAPHYLOCOCCUS AUREUS SUSCEPTIBILITIES TO FOLLOW Performed at [facility], [address], [place], [place] [address]    Report Status PENDING  Incomplete     Serology:   Imaging: If present, new imagings (plain films, ct scans, and mri) have been personally visualized and interpreted; radiology reports have been reviewed. Decision making incorporated into the Impression / Recommendations.  7/29 brain mri No acute abnormality Mild-mod parasinus disease  7/29 chest ct IMPRESSION: 1. No evidence of acute pulmonary embolism. 2. Dense consolidation of the left lower lobe with extensive airway impaction. Scatter nodular airspace infiltrate within the lingula and to a lesser extent right lower lobe most in keeping with changes acute infection in the appropriate  clinical setting.  Constance ONEIDA Passer, MD [facility] [phone] pager    09/03/2023, 12:35 PM

## 2023-09-03 NOTE — TOC Transition Note (Signed)
 Transition of Care [facility]) - Discharge Note   Patient Details  Name: [name] MRN: [id number] Date of Birth: [birth year]  Transition of [facility] CM/SW Contact:  Roxie KANDICE Stain, RN Phone Number: 09/03/2023, 3:18 PM   Clinical Narrative: [name] is stable to discharge home. Patient to follow up with PCP. No TOC needs at this time.       Final next level of care: Home/Self Care Barriers to Discharge: Barriers Resolved   Patient Goals and CMS Choice Patient states their goals for this hospitalization and ongoing recovery are:: return home          Discharge Placement                   home    Discharge Plan and Services Additional resources added to the After Visit Summary for                                       Social Drivers of Health (SDOH) Interventions SDOH Screenings   Food Insecurity: No Food Insecurity (08/31/2023)  Housing: Low Risk  (08/31/2023)  Transportation Needs: No Transportation Needs (08/31/2023)  Utilities: Not At Risk (08/31/2023)  Depression (PHQ2-9): Low Risk  (08/28/2022)  Tobacco Use: Low Risk  (08/30/2023)     Readmission Risk Interventions    09/03/2023    3:17 PM  Readmission Risk Prevention Plan  PCP or Specialist Appt within 5-7 Days Complete  Home Care Screening Complete  Medication Review (RN CM) Complete

## 2023-09-04 LAB — CULTURE, BLOOD (ROUTINE X 2)
Culture: NO GROWTH
Culture: NO GROWTH
Special Requests: ADEQUATE

## 2023-09-04 LAB — CULTURE, RESPIRATORY W GRAM STAIN: Culture: NORMAL

## 2023-09-22 ENCOUNTER — Ambulatory Visit: Payer: Self-pay | Admitting: Infectious Diseases
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
## 2023-10-05 ENCOUNTER — Inpatient Hospital Stay: Admitting: Internal Medicine

## 2023-10-15 LAB — MTB-RIF NAA WITH AFB CULTURE, SPUTUM
Acid Fast Culture: NEGATIVE
Myco tuberculosis Complex: NOT DETECTED

## 2023-10-16 LAB — MTB-RIF NAA WITH AFB CULTURE, SPUTUM
Acid Fast Culture: NEGATIVE
Myco tuberculosis Complex: NOT DETECTED

## 2024-01-16 ENCOUNTER — Emergency Department (HOSPITAL_COMMUNITY)

## 2024-01-16 ENCOUNTER — Other Ambulatory Visit: Payer: Self-pay

## 2024-01-16 ENCOUNTER — Inpatient Hospital Stay (HOSPITAL_COMMUNITY)
Admission: EM | Admit: 2024-01-16 | Discharge: 2024-01-21 | DRG: 974 | Disposition: A | Discharge status: Home / Self Care | Attending: Family Medicine | Admitting: Family Medicine

## 2024-01-16 ENCOUNTER — Encounter (HOSPITAL_COMMUNITY): Payer: Self-pay

## 2024-01-16 DIAGNOSIS — R000 Tachycardia, unspecified: Secondary | ICD-10-CM

## 2024-01-16 DIAGNOSIS — J189 Pneumonia, unspecified organism: Secondary | ICD-10-CM

## 2024-01-16 DIAGNOSIS — Z1152 Encounter for screening for COVID-19: Secondary | ICD-10-CM | POA: Diagnosis not present

## 2024-01-16 DIAGNOSIS — Z833 Family history of diabetes mellitus: Secondary | ICD-10-CM

## 2024-01-16 DIAGNOSIS — J470 Bronchiectasis with acute lower respiratory infection: Secondary | ICD-10-CM | POA: Diagnosis present

## 2024-01-16 DIAGNOSIS — R627 Adult failure to thrive: Secondary | ICD-10-CM | POA: Diagnosis present

## 2024-01-16 DIAGNOSIS — E46 Unspecified protein-calorie malnutrition: Secondary | ICD-10-CM | POA: Diagnosis present

## 2024-01-16 DIAGNOSIS — Z882 Allergy status to sulfonamides status: Secondary | ICD-10-CM | POA: Diagnosis not present

## 2024-01-16 DIAGNOSIS — Z888 Allergy status to other drugs, medicaments and biological substances status: Secondary | ICD-10-CM | POA: Diagnosis not present

## 2024-01-16 DIAGNOSIS — N179 Acute kidney failure, unspecified: Secondary | ICD-10-CM | POA: Diagnosis present

## 2024-01-16 DIAGNOSIS — D649 Anemia, unspecified: Secondary | ICD-10-CM | POA: Diagnosis not present

## 2024-01-16 DIAGNOSIS — G43909 Migraine, unspecified, not intractable, without status migrainosus: Secondary | ICD-10-CM | POA: Diagnosis not present

## 2024-01-16 DIAGNOSIS — Z9049 Acquired absence of other specified parts of digestive tract: Secondary | ICD-10-CM

## 2024-01-16 DIAGNOSIS — Z881 Allergy status to other antibiotic agents status: Secondary | ICD-10-CM

## 2024-01-16 DIAGNOSIS — B20 Human immunodeficiency virus [HIV] disease: Secondary | ICD-10-CM | POA: Diagnosis present

## 2024-01-16 DIAGNOSIS — Z91410 Personal history of adult physical and sexual abuse: Secondary | ICD-10-CM

## 2024-01-16 DIAGNOSIS — J479 Bronchiectasis, uncomplicated: Secondary | ICD-10-CM | POA: Diagnosis not present

## 2024-01-16 DIAGNOSIS — Z8619 Personal history of other infectious and parasitic diseases: Secondary | ICD-10-CM | POA: Diagnosis not present

## 2024-01-16 DIAGNOSIS — J9601 Acute respiratory failure with hypoxia: Secondary | ICD-10-CM | POA: Diagnosis present

## 2024-01-16 DIAGNOSIS — A419 Sepsis, unspecified organism: Secondary | ICD-10-CM | POA: Diagnosis present

## 2024-01-16 DIAGNOSIS — D638 Anemia in other chronic diseases classified elsewhere: Secondary | ICD-10-CM | POA: Diagnosis present

## 2024-01-16 DIAGNOSIS — R0602 Shortness of breath: Secondary | ICD-10-CM

## 2024-01-16 DIAGNOSIS — Z681 Body mass index (BMI) 19 or less, adult: Secondary | ICD-10-CM

## 2024-01-16 DIAGNOSIS — J188 Other pneumonia, unspecified organism: Secondary | ICD-10-CM | POA: Diagnosis not present

## 2024-01-16 DIAGNOSIS — F419 Anxiety disorder, unspecified: Secondary | ICD-10-CM | POA: Diagnosis not present

## 2024-01-16 DIAGNOSIS — D6851 Activated protein C resistance: Secondary | ICD-10-CM | POA: Diagnosis present

## 2024-01-16 DIAGNOSIS — F4310 Post-traumatic stress disorder, unspecified: Secondary | ICD-10-CM | POA: Diagnosis present

## 2024-01-16 DIAGNOSIS — Z91148 Patient's other noncompliance with medication regimen for other reason: Secondary | ICD-10-CM

## 2024-01-16 DIAGNOSIS — Z8249 Family history of ischemic heart disease and other diseases of the circulatory system: Secondary | ICD-10-CM | POA: Diagnosis not present

## 2024-01-16 DIAGNOSIS — R6520 Severe sepsis without septic shock: Secondary | ICD-10-CM | POA: Diagnosis present

## 2024-01-16 DIAGNOSIS — Z5320 Procedure and treatment not carried out because of patient's decision for unspecified reasons: Secondary | ICD-10-CM | POA: Diagnosis present

## 2024-01-16 DIAGNOSIS — D849 Immunodeficiency, unspecified: Secondary | ICD-10-CM

## 2024-01-16 DIAGNOSIS — Z91199 Patient's noncompliance with other medical treatment and regimen due to unspecified reason: Secondary | ICD-10-CM | POA: Diagnosis not present

## 2024-01-16 LAB — COMPREHENSIVE METABOLIC PANEL WITH GFR
ALT: 7 U/L (ref 0–44)
AST: 19 U/L (ref 15–41)
Albumin: 3.6 g/dL (ref 3.5–5.0)
Alkaline Phosphatase: 53 U/L (ref 38–126)
Anion gap: 11 (ref 5–15)
BUN: 11 mg/dL (ref 6–20)
CO2: 28 mmol/L (ref 22–32)
Calcium: 8.5 mg/dL — ABNORMAL LOW (ref 8.9–10.3)
Chloride: 96 mmol/L — ABNORMAL LOW (ref 98–111)
Creatinine, Ser: 1.14 mg/dL — ABNORMAL HIGH (ref 0.44–1.00)
GFR, Estimated: >60 mL/min (ref >60)
Glucose, Bld: 114 mg/dL — ABNORMAL HIGH (ref 70–99)
Potassium: 3.5 mmol/L (ref 3.5–5.1)
Sodium: 135 mmol/L (ref 135–145)
Total Bilirubin: 0.5 mg/dL (ref 0.0–1.2)
Total Protein: 7.9 g/dL (ref 6.5–8.1)

## 2024-01-16 LAB — RETICULOCYTES
Immature Retic Fract: 14.6 % (ref 2.3–15.9)
RBC.: 3.97 MIL/uL (ref 3.87–5.11)
Retic Count, Absolute: 27.8 K/uL (ref 19.0–186.0)
Retic Ct Pct: 0.7 % (ref 0.4–3.1)

## 2024-01-16 LAB — CBC WITH DIFFERENTIAL/PLATELET
Abs Immature Granulocytes: 0.07 K/uL (ref 0.00–0.07)
Basophils Absolute: 0 K/uL (ref 0.0–0.1)
Basophils Relative: 0 %
Eosinophils Absolute: 0 K/uL (ref 0.0–0.5)
Eosinophils Relative: 0 %
HCT: 37.2 % (ref 36.0–46.0)
Hemoglobin: 11.8 g/dL — ABNORMAL LOW (ref 12.0–15.0)
Immature Granulocytes: 1 %
Lymphocytes Relative: 12 %
Lymphs Abs: 1.1 K/uL (ref 0.7–4.0)
MCH: 25.8 pg — ABNORMAL LOW (ref 26.0–34.0)
MCHC: 31.7 g/dL (ref 30.0–36.0)
MCV: 81.2 fL (ref 80.0–100.0)
Monocytes Absolute: 0.3 K/uL (ref 0.1–1.0)
Monocytes Relative: 3 %
Neutro Abs: 7.6 K/uL (ref 1.7–7.7)
Neutrophils Relative %: 84 %
Platelets: 203 K/uL (ref 150–400)
RBC: 4.58 MIL/uL (ref 3.87–5.11)
RDW: 15.1 % (ref 11.5–15.5)
WBC: 9.1 K/uL (ref 4.0–10.5)
nRBC: 0.2 % (ref 0.0–0.2)

## 2024-01-16 LAB — RESP PANEL BY RT-PCR (RSV, FLU A&B, COVID)  RVPGX2
Influenza A by PCR: NEGATIVE
Influenza B by PCR: NEGATIVE
Resp Syncytial Virus by PCR: NEGATIVE
SARS Coronavirus 2 by RT PCR: NEGATIVE

## 2024-01-16 LAB — I-STAT CHEM 8, ED
BUN: 10 mg/dL (ref 6–20)
Calcium, Ion: 1.07 mmol/L — ABNORMAL LOW (ref 1.15–1.40)
Chloride: 97 mmol/L — ABNORMAL LOW (ref 98–111)
Creatinine, Ser: 1.1 mg/dL — ABNORMAL HIGH (ref 0.44–1.00)
Glucose, Bld: 112 mg/dL — ABNORMAL HIGH (ref 70–99)
HCT: 40 % (ref 36.0–46.0)
Hemoglobin: 13.6 g/dL (ref 12.0–15.0)
Potassium: 3.5 mmol/L (ref 3.5–5.1)
Sodium: 137 mmol/L (ref 135–145)
TCO2: 25 mmol/L (ref 22–32)

## 2024-01-16 LAB — MAGNESIUM: Magnesium: 1.5 mg/dL — ABNORMAL LOW (ref 1.7–2.4)

## 2024-01-16 LAB — FERRITIN: Ferritin: 78 ng/mL (ref 11–307)

## 2024-01-16 LAB — IRON AND TIBC
Iron: 23 ug/dL — ABNORMAL LOW (ref 28–170)
Saturation Ratios: 11 % (ref 10.4–31.8)
TIBC: 204 ug/dL — ABNORMAL LOW (ref 250–450)
UIBC: 181 ug/dL

## 2024-01-16 LAB — TYPE AND SCREEN
ABO/RH(D): O POS
Antibody Screen: NEGATIVE

## 2024-01-16 LAB — LACTATE DEHYDROGENASE: LDH: 231 U/L (ref 105–235)

## 2024-01-16 LAB — TSH: TSH: 1.316 u[IU]/mL (ref 0.350–4.500)

## 2024-01-16 LAB — VITAMIN B12: Vitamin B-12: 210 pg/mL (ref 180–914)

## 2024-01-16 LAB — T4, FREE: Free T4: 0.96 ng/dL (ref 0.61–1.12)

## 2024-01-16 LAB — I-STAT CG4 LACTIC ACID, ED: Lactic Acid, Venous: 0.9 mmol/L (ref 0.5–1.9)

## 2024-01-16 LAB — SEDIMENTATION RATE: Sed Rate: 23 mm/h — ABNORMAL HIGH (ref 0–22)

## 2024-01-16 LAB — FOLATE: Folate: 4.9 ng/mL — ABNORMAL LOW (ref >5.9)

## 2024-01-16 LAB — HCG, SERUM, QUALITATIVE: Preg, Serum: NEGATIVE

## 2024-01-16 MED ORDER — ALBUTEROL SULFATE (2.5 MG/3ML) 0.083% IN NEBU
2.5000 mg | INHALATION_SOLUTION | Freq: Once | RESPIRATORY_TRACT | Status: AC
  Administered 2024-01-16: 2.5 mg via RESPIRATORY_TRACT
  Filled 2024-01-16: qty 3

## 2024-01-16 MED ORDER — SODIUM CHLORIDE 0.9 % IV BOLUS
1000.0000 mL | Freq: Once | INTRAVENOUS | Status: AC
  Administered 2024-01-16: 1000 mL via INTRAVENOUS

## 2024-01-16 MED ORDER — ACETAMINOPHEN 325 MG PO TABS
650.0000 mg | ORAL_TABLET | Freq: Four times a day (QID) | ORAL | Status: DC | PRN
  Administered 2024-01-17: 12:00:00 650 mg via ORAL
  Filled 2024-01-16: qty 2

## 2024-01-16 MED ORDER — SODIUM CHLORIDE 0.9 % IV SOLN
2.0000 g | INTRAVENOUS | Status: DC
  Administered 2024-01-16: 2 g via INTRAVENOUS
  Filled 2024-01-16: qty 20

## 2024-01-16 MED ORDER — IOHEXOL 350 MG/ML SOLN
75.0000 mL | Freq: Once | INTRAVENOUS | Status: AC | PRN
  Administered 2024-01-16: 75 mL via INTRAVENOUS

## 2024-01-16 MED ORDER — BISACODYL 5 MG PO TBEC: 5.0000 mg | DELAYED_RELEASE_TABLET | Freq: Every day | ORAL | Status: DC | PRN

## 2024-01-16 MED ORDER — VANCOMYCIN HCL 750 MG/150ML IV SOLN
750.0000 mg | Freq: Once | INTRAVENOUS | Status: AC
  Administered 2024-01-16: 750 mg via INTRAVENOUS
  Filled 2024-01-16: qty 150

## 2024-01-16 MED ORDER — GUAIFENESIN ER 600 MG PO TB12: 600.0000 mg | ORAL_TABLET | Freq: Two times a day (BID) | ORAL | Status: DC | PRN

## 2024-01-16 MED ORDER — HYDRALAZINE HCL 20 MG/ML IJ SOLN: 5.0000 mg | INTRAMUSCULAR | Status: DC | PRN

## 2024-01-16 MED ORDER — SODIUM CHLORIDE 3 % IN NEBU: 4.0000 mL | INHALATION_SOLUTION | RESPIRATORY_TRACT | Status: AC | PRN

## 2024-01-16 MED ORDER — DOCUSATE SODIUM 100 MG PO CAPS: 100.0000 mg | ORAL_CAPSULE | Freq: Two times a day (BID) | ORAL | Status: DC

## 2024-01-16 MED ORDER — PANTOPRAZOLE SODIUM 40 MG IV SOLR
20.0000 mg | Freq: Two times a day (BID) | INTRAVENOUS | Status: DC
  Administered 2024-01-16 – 2024-01-18 (×3): 20 mg via INTRAVENOUS
  Filled 2024-01-16 (×4): qty 10

## 2024-01-16 MED ORDER — VANCOMYCIN HCL 750 MG/150ML IV SOLN
750.0000 mg | INTRAVENOUS | Status: DC
  Administered 2024-01-17: 22:00:00 750 mg via INTRAVENOUS
  Filled 2024-01-16 (×2): qty 150

## 2024-01-16 MED ORDER — BICTEGRAVIR-EMTRICITAB-TENOFOV 50-200-25 MG PO TABS
1.0000 | ORAL_TABLET | Freq: Every day | ORAL | Status: DC
  Administered 2024-01-17 – 2024-01-20 (×4): 1 via ORAL
  Filled 2024-01-16 (×8): qty 1

## 2024-01-16 MED ORDER — SODIUM CHLORIDE 0.9 % IV SOLN: INTRAVENOUS | Status: DC

## 2024-01-16 MED ORDER — ACETAMINOPHEN 650 MG RE SUPP: 650.0000 mg | Freq: Four times a day (QID) | RECTAL | Status: DC | PRN

## 2024-01-16 MED ORDER — SODIUM CHLORIDE 0.9 % IV SOLN
2.0000 g | Freq: Once | INTRAVENOUS | Status: AC
  Administered 2024-01-16: 2 g via INTRAVENOUS
  Filled 2024-01-16: qty 12.5

## 2024-01-16 MED ORDER — ACETAMINOPHEN 500 MG PO TABS
1000.0000 mg | ORAL_TABLET | Freq: Once | ORAL | Status: AC
  Administered 2024-01-16: 1000 mg via ORAL
  Filled 2024-01-16: qty 2

## 2024-01-16 MED ORDER — ATOVAQUONE 750 MG/5ML PO SUSP
750.0000 mg | Freq: Two times a day (BID) | ORAL | Status: DC
  Administered 2024-01-16 – 2024-01-17 (×2): 750 mg via ORAL
  Filled 2024-01-16 (×2): qty 5

## 2024-01-16 MED ORDER — HYDROCODONE-ACETAMINOPHEN 5-325 MG PO TABS: 1.0000 | ORAL_TABLET | ORAL | Status: DC | PRN

## 2024-01-16 MED ORDER — ONDANSETRON HCL 4 MG/2ML IJ SOLN
4.0000 mg | Freq: Four times a day (QID) | INTRAMUSCULAR | Status: DC | PRN
  Administered 2024-01-17 – 2024-01-21 (×2): 4 mg via INTRAVENOUS
  Filled 2024-01-16 (×2): qty 2

## 2024-01-16 MED ORDER — ONDANSETRON HCL 4 MG PO TABS
4.0000 mg | ORAL_TABLET | Freq: Four times a day (QID) | ORAL | Status: DC | PRN
  Filled 2024-01-16: qty 1

## 2024-01-16 MED ORDER — POLYETHYLENE GLYCOL 3350 17 G PO PACK: 17.0000 g | PACK | Freq: Every day | ORAL | Status: DC | PRN

## 2024-01-16 MED ORDER — HEPARIN SODIUM (PORCINE) 5000 UNIT/ML IJ SOLN
5000.0000 [IU] | Freq: Two times a day (BID) | INTRAMUSCULAR | Status: DC
  Administered 2024-01-20: 09:00:00 5000 [IU] via SUBCUTANEOUS
  Filled 2024-01-16 (×6): qty 1

## 2024-01-16 MED ORDER — ALBUTEROL SULFATE (2.5 MG/3ML) 0.083% IN NEBU
2.5000 mg | INHALATION_SOLUTION | RESPIRATORY_TRACT | Status: DC | PRN
  Filled 2024-01-16: qty 3

## 2024-01-16 MED ORDER — SODIUM CHLORIDE 0.9 % IV SOLN
100.0000 mg | Freq: Two times a day (BID) | INTRAVENOUS | Status: DC
  Administered 2024-01-16: 100 mg via INTRAVENOUS
  Filled 2024-01-16: qty 100

## 2024-01-16 MED ORDER — MORPHINE SULFATE (PF) 2 MG/ML IV SOLN: 2.0000 mg | INTRAVENOUS | Status: DC | PRN

## 2024-01-16 MED ORDER — SODIUM CHLORIDE 0.9 % IV SOLN: 2.0000 g | Freq: Two times a day (BID) | INTRAVENOUS | Status: DC

## 2024-01-16 MED ORDER — SODIUM CHLORIDE 0.9 % IV BOLUS
500.0000 mL | Freq: Once | INTRAVENOUS | Status: AC
  Administered 2024-01-16: 500 mL via INTRAVENOUS

## 2024-01-16 MED ORDER — DIPHENHYDRAMINE HCL 50 MG/ML IJ SOLN
25.0000 mg | Freq: Once | INTRAMUSCULAR | Status: AC
  Administered 2024-01-16: 25 mg via INTRAVENOUS
  Filled 2024-01-16: qty 1

## 2024-01-16 MED ORDER — HYDROCODONE-ACETAMINOPHEN 5-325 MG PO TABS: 1.0000 | ORAL_TABLET | Freq: Four times a day (QID) | ORAL | Status: DC | PRN

## 2024-01-16 NOTE — H&P (Signed)
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 on Bactrim for pneumonia when she was d/c from [facility] and developed a red face with swelling and can not tolerate Bactrim.    Sulfa Antibiotics Swelling    Facial swelling and burning   Vancomycin  Other (See Comments)    Intolerance, pt states that as long as med is administered with Benadryl  its tolarable   Darunavir  Rash

## 2024-01-16 NOTE — ED Provider Triage Note (Signed)
 Emergency Medicine Provider Triage Evaluation Note  Frances Ball , a 33 y.o. female  was evaluated in triage.  Pt complains of shortness of breath, fever and pain.  Patient reports she has had the symptoms for the last 2 to 3 days and they have been getting worse.  She states she did take Excedrin  because she also had a headache this morning but it did not really help.  She states she knows she has had a fever for the last couple of days.  She denies any recent sick contacts and there is no one else sick at home.  Review of Systems  Positive: Fever, chest pain, shortness of breath, lightheaded Negative:   Physical Exam  BP 105/78   Pulse (!) 147   Temp (!) 103.1 F (39.5 C)   Resp 18   SpO2 90%  Gen:   Awake, no distress   Resp:  Normal effort, patient is on supplemental oxygen for a low SpO2 in triage MSK:   Moves extremities without difficulty  Other:    Medical Decision Making  Medically screening exam initiated at 2:18 PM.  Appropriate orders placed.  BEXLEIGH THERIAULT was informed that the remainder of the evaluation will be completed by another provider, this initial triage assessment does not replace that evaluation, and the importance of remaining in the ED until their evaluation is complete.  MDM charge RN aware.  Patient going directly to a room.  Labs are ordered.   Torrence Marry RAMAN, PA-C 01/16/24 1420

## 2024-01-16 NOTE — ED Triage Notes (Signed)
 Patient complains of cough, congestion and fever.patient states that it makes it hard to breath. Speaking full sentences.

## 2024-01-16 NOTE — Consult Note (Signed)
 Name: Frances Ball MRN: [id number] DOB: [birth year]    ADMISSION DATE:  01/16/2024 CONSULTATION DATE:  01/16/2024  REFERRING MD :  Dr. 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  SIGNIFICANT EVENTS  12/14: Admitted to Step Down Unit for management of pneumonia c/b sepsis. Started on vanc, ceftriaxone , doxycyline (macrolide allergy), and atovaquone  (Bactrim allergy).   STUDIES:  12/14: CT Chest with consolidation and collapse of RLL > RML, with obstructing secretions in RLL bronchus. Additional scattered tree and bud opacities superior to the area of dense consolidation. I would additionally note that, in comparison to the patient's 08/2023 CT Chest, the area of involved lung is entirely distinct which -- along with the time course -- argues against a partially-resolved or non-resolving pneumonia. This should be considered a new/independent infectious process.  HISTORY OF PRESENT ILLNESS:  42 F with HIV/AIDS (non-adherent to Biktarvy  for at least the past 2 months due to side effects per patient report), h/o IVDU, Factor V Leiden, and HCV who presented with shortness of breath, productive cough, and mild hypoxemia.   CT Chest revealed a RLL/RML pneumonia with appearance of obstructing secretions in the RLL bronchus.   Vitals notable for robust sinus tachycardia to 120-140s and marginal blood pressures (mid to high 80s systolic BP, compared with a patient-reported baseline of low 100s SBP which is what her SBP was on arrival to the ED earlier in the day). Tmax of 103.1 F on arrival to the ED.  She was given cefepime  and vancomycin  in the ED and admitted to the hospitalist service.  The patient endorses the same history as above during my interview. She denies any hemoptysis (both now or ever in the past) but is bringing up  phlegm. She endorses nausea, but denies any constipation or vomiting or diarrhea. She endorses some new sharp R chest wall discomfort that she appreciates when she changes position but denies a pleuritic or constant characteristic to the pain. She denies any known history of either pneumocystis or candida infections.   With respect to her ART therapy, she states she was on Biktarvy  until 2 months ago when she started to feel strange / experience side effects when taking this (which she felt was unusual as she had not experienced these in the past when on Biktarvy ) and she stopped taking it until she could talk with her ID provider Corean Fireman, NP. Specifically, her concern was that these side effects might be an indication that she had developed resistance/mutations.  Her last CD4+ count in our records was 110 on 06/25/2021. She thinks she may have had more recent CD4+ count performed by a provider she refers to as Dr. Morene (she's unsure if Morene is last name or first name) who sees in [place].  PAST MEDICAL HISTORY :   has a past medical history of Clostridium difficile colitis, HCAP (healthcare-associated pneumonia) (03/15/2015), History of blood transfusion (02/03/2007), HIV (human immunodeficiency virus infection) (HCC) (dx'd 2009), Influenza A (H1N1), Intravenous drug user, Neuralgia, post-herpetic, Pneumonia (02/2014; 01/2015), PTSD (post-traumatic stress disorder), and Trichomonas vaginitis.  has a past surgical history that includes Cesarean section (2011; 2013); Tonsillectomy and adenoidectomy (1990s); Appendectomy (1990s); and Peripherally inserted central catheter insertion (Right, 06/2014; 01/2014; 03/15/2015). Prior to Admission medications  Medication Sig Start Date End Date Taking? Authorizing Provider  albuterol  (VENTOLIN  HFA) 108 (90  Base) MCG/ACT inhaler Inhale 2 puffs into the lungs every 6 (six) hours as needed for wheezing or shortness of breath. 04/17/21   Melvenia Corean SAILOR, NP   benzonatate  (TESSALON ) 200 MG capsule Take 1 capsule (200 mg total) by mouth 3 (three) times daily as needed for cough. 09/03/23   Krishnan, Gokul, MD  bictegravir-emtricitabine -tenofovir  AF (BIKTARVY ) 50-200-25 MG TABS tablet Take 1 tablet by mouth daily. 09/03/23   Verdene Purchase, MD  buprenorphine  (SUBUTEX ) 8 MG SUBL SL tablet Place 8 mg under the tongue 3 (three) times daily. 08/10/23   [provider]  folic acid  (FOLVITE ) 1 MG tablet Take 1 tablet (1 mg total) by mouth daily. 09/04/23   Krishnan, Gokul, MD  midodrine  (PROAMATINE ) 2.5 MG tablet Take 1 tablet (2.5 mg total) by mouth 3 (three) times daily with meals. 09/03/23   Krishnan, Gokul, MD  ondansetron  (ZOFRAN ) 4 MG tablet Take 1-2 tablets 4 mg by mouth every 8 (eight) hours as needed for nausea or vomiting. 11/30/22   Melvenia Corean SAILOR, NP   Allergies[1]  FAMILY HISTORY:  family history includes Diabetes in her father; Hypertension in her father. SOCIAL HISTORY:  reports that she has never smoked. She has never used smokeless tobacco. She reports that she does not drink alcohol and does not use drugs.  SUBJECTIVE:   VITAL SIGNS: Temp:  [100.9 F (38.3 C)-103.1 F (39.5 C)] 100.9 F (38.3 C) (12/14 1847) Pulse Rate:  [113-147] 113 (12/14 1815) Resp:  [16-31] 31 (12/14 1815) BP: (91-105)/(58-78) 92/60 (12/14 1815) SpO2:  [90 %-100 %] 91 % (12/14 1815)  PHYSICAL EXAMINATION: General:  Malnourished young woman in no distress, accompanied by her daughter. HEENT:  MMM. Cardiovascular:  Tachycardic, regular, no m/r/g. Lungs:  Clear to auscultation on left side. Greatly diminished breath sounds at right base. No wheezing. Abdomen:  Soft, NTND. Musculoskeletal:  Non-edematous. Able to move all extremities without any gross evidence of focal weakness.  Recent Labs  Lab 01/16/24 1453 01/16/24 1529  NA 135 137  K 3.5 3.5  CL 96* 97*  CO2 28  --   BUN 11 10  CREATININE 1.14* 1.10*  GLUCOSE 114* 112*   Recent Labs  Lab  01/16/24 1421 01/16/24 1529  HGB 11.8* 13.6  HCT 37.2 40.0  WBC 9.1  --   PLT 203  --    CT Angio Chest PE W and/or Wo Contrast Result Date: 01/16/2024 EXAM: CTA of the Chest with contrast for PE 01/16/2024 06:41:47 PM TECHNIQUE: CTA of the chest was performed after the administration of 75 mL of iohexol  (OMNIPAQUE ) 350 MG/ML injection. Multiplanar reformatted images are provided for review. MIP images are provided for review. Automated exposure control, iterative reconstruction, and/or weight based adjustment of the mA/kV was utilized to reduce the radiation dose to as low as reasonably achievable. COMPARISON: CT chest 08/30/2023. CLINICAL HISTORY: rule out PE FINDINGS: PULMONARY ARTERIES: Pulmonary arteries are adequately opacified for evaluation. No pulmonary embolism. Main pulmonary artery is normal in caliber. MEDIASTINUM: The heart and pericardium demonstrate no acute abnormality. There is no acute abnormality of the thoracic aorta. LYMPH NODES: Enlarged right hilar lymph nodes measuring up to 13 mm. LUNGS AND PLEURA: Airspace consolidation in the right middle lobe, right lower lobe and minimally in the left lower lobe with air bronchograms. Additional tree-in-bud opacities and patchy airspace opacities are seen throughout the superior segment of the left lower lobe. New nodular densities in the left upper lobe measuring up to 6 mm on image  6/56. Secretion/occlusion of the right lower lobe bronchus. Peribronchial wall thickening in the right lower lobe and right middle lobe. New right upper lobe nodular density measuring 6 mm image 6/39. Calcified granuloma in the right upper lobe. No pleural effusion or pneumothorax. UPPER ABDOMEN: Limited images of the upper abdomen are unremarkable. SOFT TISSUES AND BONES: No acute bone or soft tissue abnormality. IMPRESSION: 1. No evidence of pulmonary embolism. 2. Airspace consolidation in the right middle lobe, right lower lobe, and minimally in the left lower  lobe with air bronchograms compatible with multifocal pneumonia . 3. Additional tree-in-bud opacities and patchy airspace opacities in the superior segment of the left lower lobe compatible with infection . 4. Secretions in the right lower lobe bronchus likely related to aspiration. Follow-up recommended to exclude underlying lesion . 5. Multiple new solid pulmonary nodules up to 6 mm. Recommend non-contrast chest CT at 3-6 months, then consider an additional non-contrast chest CT at 18-24 months per Fleischner Society Guidelines. 6. Enlarged right hilar lymph nodes measuring up to 13 mm. Electronically signed by: Greig Pique MD 01/16/2024 07:08 PM EST RP Workstation: [id number]   DG Chest 2 View Result Date: 01/16/2024 EXAM: 2 VIEW(S) XRAY OF THE CHEST 01/16/2024 03:00:00 PM COMPARISON: 08/30/2023 CLINICAL HISTORY: shob FINDINGS: LUNGS AND PLEURA: There are trace bilateral pleural effusions. There is right middle lobe airspace consolidation and a small amount of patchy airspace disease in the right lower lobe. No pneumothorax. HEART AND MEDIASTINUM: No acute abnormality of the cardiac and mediastinal silhouettes. BONES AND SOFT TISSUES: No acute osseous abnormality. IMPRESSION: 1. Right middle lobe consolidation with additional patchy airspace disease in the right lower lobe, compatible with pneumonia. Follow-up imaging recommended in 4 to 6 weeks to confirm complete resolution. 2. Trace bilateral pleural effusions. Electronically signed by: Greig Pique MD 01/16/2024 03:09 PM EST RP Workstation: [id number]    ASSESSMENT / PLAN:  # Sepsis - Patient is tachycardic to the 130-140s and has relative hypotension though with normal lactate on last check. - Recommend at least an additional 1L IVF bolus (pt weight is only 44.9 kg) followed by clinical reassessment of HR, BP and UOP. - I have spoken with Dr. Tobie about a plan to admit the patient to the stepdown unit. TRH will remain primary. PCCM will  continue to follow in consultative role.  # Multifocal pneumonia - She has already been ruled out for influenza, RSV, and COVID. - Send induced sputum gram stain, culture, and PJP DFA + PCR. - Send MRSA nares. - Antimicrobials: Recommend vancomycin , ceftriaxone , doxycycline  (macrolide allergy) + atovaquone  (Bactrim allergy). ID has also been consulted. Should they have different recommendations with respect to antimicrobials I would defer to their expertise. - Chest PT + Flutter valve for clearance of obstructing secretions in RLL bronchus visualized on CT Chest.  # HIV - Recommend ID consultation as above. - Not currently on ART therapy. - Recommend obtaining CD4+ count.   During this visit, Critical Care services were medically necessary for the management of sepsis and pneumonia in this immunocompromised patient which require careful monitoring and treatment with targeted antimicrobial therapy and coordination of care with consulting services.  My presence was medically necessary for assessing the critical condition and directing the critical care treatment for the patient. Time spent dedicated to providing critical care to this patient both on the unit and at the bedside, excluding procedure time, totaled 60 minutes.   Lamar Dales, MD Pulmonary, Critical Care & Sleep Medicine [facility]  Pager: see AMION   If no response to pager, please call critical care on call (see AMION) until 7pm After 7:00 pm call E-Link  01/16/2024, 8:02 PM     [1]  Allergies Allergen Reactions   Levaquin [Levofloxacin In D5w] Shortness Of Breath and Rash   Zithromax [Azithromycin] Shortness Of Breath, Swelling and Other (See Comments)    Pt reports tightness in skin with redness    Bactrim [Sulfamethoxazole-Trimethoprim] Swelling and Other (See Comments)    Pt reports that she was placed on Bactrim for pneumonia when she was d/c from [facility] and developed a red face with  swelling and can not tolerate Bactrim.    Sulfa Antibiotics Swelling    Facial swelling and burning   Vancomycin  Other (See Comments)    Intolerance, pt states that as long as med is administered with Benadryl  its tolarable   Darunavir  Rash

## 2024-01-16 NOTE — Progress Notes (Addendum)
 Pharmacy Antibiotic Note  Frances Ball is a 33 y.o. female admitted on 01/16/2024 with pneumonia, concern for pneumocystis and pharmacy has been consulted for atovaquone  dosing, noted allergy to Bactrim   Plan: Atovaquone  750 mg PO BID with food F/u tolerance, ID recs and LOT  Addendum: Now consulted to add on vancomycin  Vancomycin  750 mg IV q 24h (eAUC 493) F/u renal function, Cx/MRSA PCR to narrow Vancomycin  levels as indicated      Temp (24hrs), Avg:102 F (38.9 C), Min:100.9 F (38.3 C), Max:103.1 F (39.5 C)  Recent Labs  Lab 01/16/24 1421 01/16/24 1453 01/16/24 1529  WBC 9.1  --   --   CREATININE  --  1.14* 1.10*  LATICACIDVEN  --   --  0.9    CrCl cannot be calculated (Unknown ideal weight.).    Allergies[1]  Dorn Poot, PharmD, [facility] Clinical Pharmacist ED Pharmacist Phone # [phone] 01/16/2024 7:40 PM      [1]  Allergies Allergen Reactions   Levaquin [Levofloxacin In D5w] Shortness Of Breath and Rash   Zithromax [Azithromycin] Shortness Of Breath, Swelling and Other (See Comments)    Pt reports tightness in skin with redness    Bactrim [Sulfamethoxazole-Trimethoprim] Swelling and Other (See Comments)    Pt reports that she was placed on Bactrim for pneumonia when she was d/c from [facility] and developed a red face with swelling and can not tolerate Bactrim.    Sulfa Antibiotics Swelling    Facial swelling and burning   Vancomycin  Other (See Comments)    Intolerance, pt states that as long as med is administered with Benadryl  its tolarable   Darunavir  Rash

## 2024-01-16 NOTE — ED Notes (Signed)
 The computer in this room is not working.

## 2024-01-16 NOTE — Progress Notes (Signed)
 ED Pharmacy Antibiotic Sign Off An antibiotic consult was received from an ED provider for vancomycin  per pharmacy dosing for sepsis. A chart review was completed to assess appropriateness.   The following one time order(s) were placed:  750 mg IV x 1 over + benadryl  per pt history of tolerance with benadryl  and extended infusion times  Further antibiotic and/or antibiotic pharmacy consults should be ordered by the admitting provider if indicated.   Thank you for allowing pharmacy to be a part of this patient's care.   Dorn Poot, [facility]  Clinical Pharmacist 01/16/2024 3:19 PM
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
## 2024-01-16 NOTE — ED Notes (Signed)
 I drew blood off the IV and gave it to phlebotomy for them to send down for what was ordered.

## 2024-01-17 ENCOUNTER — Telehealth (HOSPITAL_COMMUNITY): Payer: Self-pay

## 2024-01-17 ENCOUNTER — Other Ambulatory Visit (HOSPITAL_COMMUNITY): Payer: Self-pay

## 2024-01-17 DIAGNOSIS — B20 Human immunodeficiency virus [HIV] disease: Secondary | ICD-10-CM | POA: Diagnosis present

## 2024-01-17 DIAGNOSIS — Z91199 Patient's noncompliance with other medical treatment and regimen due to unspecified reason: Secondary | ICD-10-CM

## 2024-01-17 DIAGNOSIS — Z882 Allergy status to sulfonamides status: Secondary | ICD-10-CM

## 2024-01-17 DIAGNOSIS — J188 Other pneumonia, unspecified organism: Principal | ICD-10-CM | POA: Diagnosis present

## 2024-01-17 DIAGNOSIS — J479 Bronchiectasis, uncomplicated: Secondary | ICD-10-CM

## 2024-01-17 LAB — T-HELPER CELLS (CD4) COUNT (NOT AT ARMC)
CD4 % Helper T Cell: 8 % — ABNORMAL LOW (ref 33–65)
CD4 T Cell Abs: 148 /uL — ABNORMAL LOW (ref 400–1790)

## 2024-01-17 LAB — PROCALCITONIN: Procalcitonin: 4.63 ng/mL

## 2024-01-17 MED ORDER — DAPSONE 100 MG PO TABS
100.0000 mg | ORAL_TABLET | Freq: Every day | ORAL | Status: DC
  Administered 2024-01-17 – 2024-01-21 (×5): 100 mg via ORAL
  Filled 2024-01-17 (×5): qty 1

## 2024-01-17 MED ORDER — MAGNESIUM SULFATE 4 GM/100ML IV SOLN
4.0000 g | Freq: Once | INTRAVENOUS | Status: DC
  Filled 2024-01-17: qty 100

## 2024-01-17 MED ORDER — SODIUM CHLORIDE 0.9 % IV SOLN
2.0000 g | Freq: Two times a day (BID) | INTRAVENOUS | Status: DC
  Administered 2024-01-17 – 2024-01-21 (×8): 2 g via INTRAVENOUS
  Filled 2024-01-17 (×8): qty 12.5

## 2024-01-17 MED ORDER — MAGNESIUM SULFATE 2 GM/50ML IV SOLN
2.0000 g | Freq: Once | INTRAVENOUS | Status: AC
  Administered 2024-01-17: 13:00:00 2 g via INTRAVENOUS
  Filled 2024-01-17: qty 50

## 2024-01-17 MED ORDER — DIPHENHYDRAMINE HCL 50 MG/ML IJ SOLN
25.0000 mg | INTRAMUSCULAR | Status: DC
  Administered 2024-01-17: 20:00:00 25 mg via INTRAVENOUS
  Filled 2024-01-17 (×3): qty 1

## 2024-01-17 MED ORDER — SODIUM CHLORIDE 0.9 % IV SOLN
2.0000 g | Freq: Once | INTRAVENOUS | Status: AC
  Administered 2024-01-17: 11:00:00 2 g via INTRAVENOUS
  Filled 2024-01-17: qty 12.5

## 2024-01-17 MED ORDER — POTASSIUM CHLORIDE CRYS ER 20 MEQ PO TBCR
40.0000 meq | EXTENDED_RELEASE_TABLET | Freq: Once | ORAL | Status: DC
  Filled 2024-01-17: qty 2

## 2024-01-17 MED ORDER — POTASSIUM CHLORIDE 10 MEQ/100ML IV SOLN
10.0000 meq | INTRAVENOUS | Status: AC
  Administered 2024-01-17 (×3): 10 meq via INTRAVENOUS
  Filled 2024-01-17 (×3): qty 100

## 2024-01-17 NOTE — Progress Notes (Signed)
° °  Brief Progress Note   _____________________________________________________________________________________________________________  Patient Name: Frances Ball Patient DOB: 09-22-1990 Date: @TODAY @      Data: Reviewed labs, VS, notes.      Action: No action needed at this time.     Response:    _____________________________________________________________________________________________________________  [facility] RN Expeditor [name] Please contact us  directly via secure chat (search for [facility]) or by calling us  at [phone] [facility]).

## 2024-01-17 NOTE — Telephone Encounter (Signed)
 Pharmacy Patient Advocate Encounter  Insurance verification completed.    The patient is insured through [facility] MEDICAID.     Ran test claim for Biktarvy  30-120-15 MG Tabs and the current 30 day co-pay is $0.   This test claim was processed through [facility]- copay amounts may vary at other pharmacies due to [employer], or as the patient moves through the different stages of their insurance plan.
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
## 2024-01-17 NOTE — ED Notes (Signed)
 Dr. Sigdel notified pt declined oral potassium, Iv Protonix , and IV magnesium .  PER pt All of those medications make her nauseated.  RN offered to see if any nausea medication was ordered pt declined.

## 2024-01-17 NOTE — ED Notes (Signed)
 Dr. Sigdel notified pt asked to stop the magnesium  due to the way it was making her feel.  PT able to tolerate the IV potassium at this time.

## 2024-01-17 NOTE — Evaluation (Signed)
 Physical Therapy Evaluation Patient Details Name: Frances Ball MRN: [id number] DOB: 12-23-1990 Today's Date: 01/17/2024  History of Present Illness  Pt is 33 yo F adm 01/16/24 with cough congestion and fever, FTT, hypoxia. Pt with multifocal PNA. PMH: IV drug use hx, VTE, factor V Leiden, HIV AIDS, chronic bronchiectasis, anemia, hep C, hospitalized 08/2023, PTSD  Clinical Impression  Pt admitted with above diagnosis. Pt reports that she has been living with her mother though homelessness mentioned in chart. 68 yo daughter present on eval who reports that she is homeschooled. Eval very limited by pt feeling dizzy in standing with BP 90's over 40's and 50's. Pt with O2 sats 98-99% on 2L O2. Pt not on supplemental O2 at baseline and reports each time she takes it off to go to the bathroom desats to 70's. Pt able to come to EOB independently but needed min A to stand and could not progress ambulation due to dizziness. PT will follow acutely but do not anticipate her needing PT at d/c. Given incentive spirometer and taught how to use.  Pt currently with functional limitations due to the deficits listed below (see PT Problem List). Pt will benefit from acute skilled PT to increase their independence and safety with mobility to allow discharge.           If plan is discharge home, recommend the following: Assistance with cooking/housework   Can travel by private vehicle        Equipment Recommendations Other (comment) (TBD)  Recommendations for Other Services       Functional Status Assessment Patient has had a recent decline in their functional status and demonstrates the ability to make significant improvements in function in a reasonable and predictable amount of time.     Precautions / Restrictions Precautions Precautions: Other (comment) Recall of Precautions/Restrictions: Intact Precaution/Restrictions Comments: immunocompromised Restrictions Weight Bearing Restrictions Per Provider  Order: No      Mobility  Bed Mobility Overal bed mobility: Modified Independent             General bed mobility comments: pt able to come to EOB slowly    Transfers Overall transfer level: Needs assistance Equipment used: 1 person hand held assist Transfers: Sit to/from Stand Sit to Stand: Min assist           General transfer comment: pt needed HHA to stand due to dizziness, low BP (90's/40's and 50's. Unsteady in standing and with HA    Ambulation/Gait               General Gait Details: did not progress gait due to hypotension and dizziness  Stairs            Wheelchair Mobility     Tilt Bed    Modified Rankin (Stroke Patients Only)       Balance Overall balance assessment: Mild deficits observed, not formally tested                                           Pertinent Vitals/Pain Pain Assessment Pain Assessment: Faces Faces Pain Scale: Hurts little more Pain Location: generalized and head with standing Pain Descriptors / Indicators: Headache Pain Intervention(s): Limited activity within patient's tolerance, Monitored during session    Home Living Family/patient expects to be discharged to:: Private residence Living Arrangements: Parent;Children Available Help at Discharge: Family;Available 24 hours/day Type of Home: Mobile  home         Home Layout: One level Home Equipment:  (unsure) Additional Comments: pt reports that she lives with her mom but chart mentions that she is homeless. 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    Prior Function Prior Level of Function : Independent/Modified Independent                     Extremity/Trunk Assessment   Upper Extremity Assessment Upper Extremity Assessment: Generalized weakness    Lower Extremity Assessment Lower Extremity Assessment: Generalized weakness    Cervical / Trunk Assessment Cervical / Trunk Assessment: Normal  Communication    Communication Communication: No apparent difficulties    Cognition Arousal: Alert Behavior During Therapy: Flat affect                           PT - Cognition Comments: info given by pt this admission does not all match what is in chart or what was taken last admission. Further testing needed Following commands: Intact       Cueing Cueing Techniques: Verbal cues     General Comments General comments (skin integrity, edema, etc.): SPO2 99% on 2L O2. HR 127-130 bpm. BP 92/54. Pt with vaginal bleeding and reports she had her period but the bleeding has continued the past 6 days which is unusual for her. Pt has flutter valve in bed but is unable to exhale strong enough to use it. Given IS and she was able to acheive only 500.    Exercises     Assessment/Plan    PT Assessment Patient needs continued PT services  PT Problem List Decreased strength;Decreased activity tolerance;Decreased balance;Decreased mobility;Cardiopulmonary status limiting activity       PT Treatment Interventions DME instruction;Gait training;Stair training;Functional mobility training;Therapeutic activities;Therapeutic exercise;Patient/family education;Balance training    PT Goals (Current goals can be found in the Care Plan section)  Acute Rehab PT Goals Patient Stated Goal: feel better PT Goal Formulation: With patient Time For Goal Achievement: 01/31/24 Potential to Achieve Goals: Good    Frequency Min 2X/week     Co-evaluation               AM-PAC PT 6 Clicks Mobility  Outcome Measure Help needed turning from your back to your side while in a flat bed without using bedrails?: None Help needed moving from lying on your back to sitting on the side of a flat bed without using bedrails?: None Help needed moving to and from a bed to a chair (including a wheelchair)?: None Help needed standing up from a chair using your arms (e.g., wheelchair or bedside chair)?: A Little Help needed  to walk in hospital room?: A Little Help needed climbing 3-5 steps with a railing? : A Lot 6 Click Score: 20    End of Session Equipment Utilized During Treatment: Oxygen Activity Tolerance: Patient limited by fatigue;Treatment limited secondary to medical complications (Comment) (hypotension) Patient left: in bed;with call bell/phone within reach;with family/visitor present Nurse Communication: Mobility status PT Visit Diagnosis: Unsteadiness on feet (R26.81);Muscle weakness (generalized) (M62.81);Dizziness and giddiness (R42);Difficulty in walking, not elsewhere classified (R26.2)    Time: 8771-8756 PT Time Calculation (min) (ACUTE ONLY): 15 min   Charges:   PT Evaluation $PT Eval Moderate Complexity: 1 Mod   PT General Charges $$ ACUTE PT VISIT: 1 Visit         Richerd Lipoma, PT  Acute Rehab Services Secure chat preferred  Office [phone]   Jerilynn Feldmeier L Dmario Russom 01/17/2024, 1:24 PM

## 2024-01-17 NOTE — Progress Notes (Signed)
 RT instructed Pt to use flutter valve. Pt states she knows how to use and will try to perform 10 times every other hour.

## 2024-01-17 NOTE — ED Notes (Signed)
 Dr. Mcarthur notified of heart rate of  126 and the monitor shows it went up to 143. current bp is 97/70, MAP 79, TEMP was 101.8 gave tylenol  at 11:32. pt declined Lovenox  injection MD notified.

## 2024-01-17 NOTE — Progress Notes (Signed)
 PROGRESS NOTE    [name]  FMW:[id number] DOB: [birth year] DOA: 01/16/2024 PCP: Melvenia Corean SAILOR, NP   Brief Narrative:   Frances Ball is a 33 y.o. female with past medical history  of IV drug use history, factor V Leiden without history of VTE, HIV/AIDS and noncompliant with ART regimen, chronic bronchiectasis, anemia, hep C, failure to thrive coming for cough/ sob and hypoxia.   Similar admission in July 2025 of this year with recent admission shortness of breath cough, patient has multiple social issues with homelessness transport limited ID follow-up and other appointments.  Per July note patient was on Biktarvy  and had not been taking.  History of bronchiectasis since 2020.   August 30, 2023-respiratory panel negative for flu RSV and COVID. August 30, 2023-blood cultures no growth 5 days.  August 31, 2023-20 pathogen respiratory panel PCR negative. August 31, 2023-MRSA PCR negative. July 29 serum Blastomyces antigen-negative. July 29 expectorated sputum-negative culture, rare gram-positive cocci and rare gram-negative rods. July 30 MT[employer] sputum x 3-negative and no acid-fast bacilli isolated after 6 weeks. September 01, 2023 respiratory culture shows few WBCs moderate gram-negative rods rare gram-positive cocci is identified as Staph aureus that are pansensitive and resistant tetracycline. Crypto antigen negative.     In the ER pt was febrile, tachypneic, tachycardic, borderline hypotension and received IV fluid bolus per sepsis protocol, initiated on ceftriaxone  cefepime  and vancomycin  for multifocal pneumonia noted on CT.  Also multiple pulmonary nodules noted.  ID consulted and started on dapsone .  Evaluated by pulmonary critical care as well  Assessment & Plan:   Principal Problem:   Multifocal pneumonia Active Problems:   HIV disease (HCC)   Sepsis with acute hypoxic respiratory failure without septic shock [facility])   Immunocompromised   33 year old female with history of HIV  and AIDS noncompliance protein calorie malnutrition presenting with fever cough shortness of breath and found to have unresolving and recurrent pneumonia since her July admission patient was also seen by pulmonology on not sure if patient had a bronchoscopy but will request pulmonology consult.   >> Multifocal pneumonia/bronchiectasis: Recurrent pneumonia in immunocompromised HIV patient, that has been refractory to treatment with failure to treat with multiple antibiotic regimens including coverage for MRSA and Pseudomonas.   Will continue cefepime , vancomycin , dapsone  Follow up on pending tests-beta D glucan, LDH, CMV PCR, HIV viral load, CD4 count,  Follow-up blood culture, sputum culture, ?bronch PCCM on board ID was also consulted on admission  Sepsis secondary to above: Received fluid boluses.  Patient is still tachycardic/borderline blood pressure.   Lactic acid reassuring at 0.9.   Will initiate Levophed if needed.  >>HIV/AID: Obtain viral load / cd4 and started biktarvy  and dapsone   >> Hepatitis C: Patient being followed by infectious disease.     >> Protein calorie malnutrition: Nutrition is consulted regular diet.     >> Low blood pressures: Suspect this may be patient's baseline due to her low BMI.  Continue with gentle IV fluid hydration.  Will measure blood pressure with pediatric cuff and follow.     >> AKI: Again we will continue with gentle IV fluid hydration creatinine is mildly elevated at 1.14 eGFR is more than 60.     >> Low ionized calcium: Magnesium  level is pending will replace as deemed appropriate.     >> Anemia: Suspect anemia of chronic disease, will obtain anemia panel stool occult IV PPI-type and screen and follow.       DVT prophylaxis:  Heparin  every 12h.   Consults:  Pulmonology. ID.    Advance Care Planning:    Code Status: Full Code    Family Communication:  None Disposition Plan:  To be determined Severity of Illness: The  appropriate patient status for this patient is INPATIENT. Inpatient status is judged to be reasonable and necessary in order to provide the required intensity of service to ensure the patient's safety. The patient's presenting symptoms, physical exam findings, and initial radiographic and laboratory data in the context of their chronic comorbidities is felt to place them at high risk for further clinical deterioration. Furthermore, it is not anticipated that the patient will be medically stable for discharge from the hospital within 2 midnights of admission.    * I certify that at the point of admission it is my clinical judgment that the patient will require inpatient hospital care spanning beyond 2 midnights from the point of admission due to high intensity of service, high risk for further deterioration and high frequency of surveillance required.*     Consultants: ID, PCCM  Subjective:  Patient seen and examined.  Blood pressure 90s over 60s, heart rate 120s, regular.  Patient is alert and oriented.  Patient says magnesium  makes her nauseous and therefore she refused IV infusion earlier today.    Objective: Vitals:   01/17/24 0415 01/17/24 0600 01/17/24 0715 01/17/24 0716  BP: (!) 84/62  90/64   Pulse: (!) 118  (!) 115   Resp: (!) 21   (!) 24  Temp:  99.9 F (37.7 C)    TempSrc:  Oral    SpO2: 100%  100%    No intake or output data in the 24 hours ending 01/17/24 0742 There were no vitals filed for this visit.  Examination:  General: Ill looking Chest: Mild crackles bilaterally, no wheezing CVS: S1, S2, tachycardia, regular Abdomen: Soft, nontender Extremities: No edema  Data Reviewed: I have personally reviewed following labs and imaging studies  CBC: Recent Labs  Lab 01/16/24 1421 01/16/24 1529  WBC 9.1  --   NEUTROABS 7.6  --   HGB 11.8* 13.6  HCT 37.2 40.0  MCV 81.2  --   PLT 203  --    Basic Metabolic Panel: Recent Labs  Lab 01/16/24 1453 01/16/24 1529  01/16/24 2027  NA 135 137  --   K 3.5 3.5  --   CL 96* 97*  --   CO2 28  --   --   GLUCOSE 114* 112*  --   BUN 11 10  --   CREATININE 1.14* 1.10*  --   CALCIUM 8.5*  --   --   MG  --   --  1.5*   GFR: CrCl cannot be calculated (Unknown ideal weight.). Liver Function Tests: Recent Labs  Lab 01/16/24 1453  AST 19  ALT 7  ALKPHOS 53  BILITOT 0.5  PROT 7.9  ALBUMIN  3.6   No results for input(s): LIPASE, AMYLASE in the last 168 hours. No results for input(s): AMMONIA in the last 168 hours. Coagulation Profile: No results for input(s): INR, PROTIME in the last 168 hours. Cardiac Enzymes: No results for input(s): CKTOTAL, CKMB, CKMBINDEX, TROPONINI in the last 168 hours. BNP (last 3 results) No results for input(s): PROBNP in the last 8760 hours. HbA1C: No results for input(s): HGBA1C in the last 72 hours. CBG: No results for input(s): GLUCAP in the last 168 hours. Lipid Profile: No results for input(s): CHOL, HDL, LDLCALC, TRIG, CHOLHDL, LDLDIRECT  in the last 72 hours. Thyroid  Function Tests: Recent Labs    01/16/24 2027  TSH 1.316  FREET4 0.96   Anemia Panel: Recent Labs    01/16/24 2027  VITAMINB12 210  FOLATE 4.9*  FERRITIN 78  TIBC 204*  IRON 23*  RETICCTPCT 0.7   Sepsis Labs: Recent Labs  Lab 01/16/24 1529 01/16/24 2027  PROCALCITON  --  4.63  LATICACIDVEN 0.9  --     Recent Results (from the past 240 hours)  Resp panel by RT-PCR (RSV, Flu A&B, Covid) Anterior Nasal Swab     Status: None   Collection Time: 01/16/24  2:21 PM   Specimen: Anterior Nasal Swab  Result Value Ref Range Status   SARS Coronavirus 2 by RT PCR NEGATIVE NEGATIVE Final   Influenza A by PCR NEGATIVE NEGATIVE Final   Influenza B by PCR NEGATIVE NEGATIVE Final    Comment: (NOTE) The Xpert Xpress SARS-CoV-2/FLU/RSV plus assay is intended as an aid in the diagnosis of influenza from Nasopharyngeal swab specimens and should not be used as a  sole basis for treatment. Nasal washings and aspirates are unacceptable for Xpert Xpress SARS-CoV-2/FLU/RSV testing.  Fact Sheet for Patients: bloggercourse.com  Fact Sheet for Healthcare Providers: seriousbroker.it  This test is not yet approved or cleared by the [place]  FDA and has been authorized for detection and/or diagnosis of SARS-CoV-2 by FDA under an Emergency Use Authorization (EUA). This EUA will remain in effect (meaning this test can be used) for the duration of the COVID-19 declaration under Section 564(b)(1) of the Act, 21 U.S.C. section 360bbb-3(b)(1), unless the authorization is terminated or revoked.     Resp Syncytial Virus by PCR NEGATIVE NEGATIVE Final    Comment: (NOTE) Fact Sheet for Patients: bloggercourse.com  Fact Sheet for Healthcare Providers: seriousbroker.it  This test is not yet approved or cleared by the [place]  FDA and has been authorized for detection and/or diagnosis of SARS-CoV-2 by FDA under an Emergency Use Authorization (EUA). This EUA will remain in effect (meaning this test can be used) for the duration of the COVID-19 declaration under Section 564(b)(1) of the Act, 21 U.S.C. section 360bbb-3(b)(1), unless the authorization is terminated or revoked.  Performed at [facility], [address] [place], [place] [address]          Radiology Studies: CT Angio Chest PE W and/or Wo Contrast Result Date: 01/16/2024 EXAM: CTA of the Chest with contrast for PE 01/16/2024 06:41:47 PM TECHNIQUE: CTA of the chest was performed after the administration of 75 mL of iohexol  (OMNIPAQUE ) 350 MG/ML injection. Multiplanar reformatted images are provided for review. MIP images are provided for review. Automated exposure control, iterative reconstruction, and/or weight based adjustment of the mA/kV was utilized to reduce the radiation  dose to as low as reasonably achievable. COMPARISON: CT chest 08/30/2023. CLINICAL HISTORY: rule out PE FINDINGS: PULMONARY ARTERIES: Pulmonary arteries are adequately opacified for evaluation. No pulmonary embolism. Main pulmonary artery is normal in caliber. MEDIASTINUM: The heart and pericardium demonstrate no acute abnormality. There is no acute abnormality of the thoracic aorta. LYMPH NODES: Enlarged right hilar lymph nodes measuring up to 13 mm. LUNGS AND PLEURA: Airspace consolidation in the right middle lobe, right lower lobe and minimally in the left lower lobe with air bronchograms. Additional tree-in-bud opacities and patchy airspace opacities are seen throughout the superior segment of the left lower lobe. New nodular densities in the left upper lobe measuring up to 6 mm on image 6/56. Secretion/occlusion  of the right lower lobe bronchus. Peribronchial wall thickening in the right lower lobe and right middle lobe. New right upper lobe nodular density measuring 6 mm image 6/39. Calcified granuloma in the right upper lobe. No pleural effusion or pneumothorax. UPPER ABDOMEN: Limited images of the upper abdomen are unremarkable. SOFT TISSUES AND BONES: No acute bone or soft tissue abnormality. IMPRESSION: 1. No evidence of pulmonary embolism. 2. Airspace consolidation in the right middle lobe, right lower lobe, and minimally in the left lower lobe with air bronchograms compatible with multifocal pneumonia . 3. Additional tree-in-bud opacities and patchy airspace opacities in the superior segment of the left lower lobe compatible with infection . 4. Secretions in the right lower lobe bronchus likely related to aspiration. Follow-up recommended to exclude underlying lesion . 5. Multiple new solid pulmonary nodules up to 6 mm. Recommend non-contrast chest CT at 3-6 months, then consider an additional non-contrast chest CT at 18-24 months per Fleischner Society Guidelines. 6. Enlarged right hilar lymph nodes  measuring up to 13 mm. Electronically signed by: Greig Pique MD 01/16/2024 07:08 PM EST RP Workstation: [id number]   DG Chest 2 View Result Date: 01/16/2024 EXAM: 2 VIEW(S) XRAY OF THE CHEST 01/16/2024 03:00:00 PM COMPARISON: 08/30/2023 CLINICAL HISTORY: shob FINDINGS: LUNGS AND PLEURA: There are trace bilateral pleural effusions. There is right middle lobe airspace consolidation and a small amount of patchy airspace disease in the right lower lobe. No pneumothorax. HEART AND MEDIASTINUM: No acute abnormality of the cardiac and mediastinal silhouettes. BONES AND SOFT TISSUES: No acute osseous abnormality. IMPRESSION: 1. Right middle lobe consolidation with additional patchy airspace disease in the right lower lobe, compatible with pneumonia. Follow-up imaging recommended in 4 to 6 weeks to confirm complete resolution. 2. Trace bilateral pleural effusions. Electronically signed by: Greig Pique MD 01/16/2024 03:09 PM EST RP Workstation: [id number]        Scheduled Meds:  atovaquone   750 mg Oral BID WC   bictegravir-emtricitabine -tenofovir  AF  1 tablet Oral Daily   heparin   5,000 Units Subcutaneous Q12H   pantoprazole  (PROTONIX ) IV  20 mg Intravenous Q12H   Continuous Infusions:  sodium chloride  75 mL/hr at 01/16/24 2008   cefTRIAXone  (ROCEPHIN )  IV Stopped (01/16/24 2252)   doxycycline  (VIBRAMYCIN ) IV Stopped (01/16/24 2249)   vancomycin             Derryl Duval, MD [facility] 01/17/2024, 7:42 AM

## 2024-01-17 NOTE — Progress Notes (Addendum)
 Pharmacy Antibiotic Note  Frances Ball is a 33 y.o. female admitted on 01/16/2024 with cough/congestion and concern for PNA.  Pharmacy has been consulted for Cefepime  dosing.  Plan: - Start Cefepime  2g IV every 12 hours - Continue Vancomycin  750 mg IV every 24 hours (eAUC 485, VT 11.4, SCr 1.1) - Continue diphenhydramine  25 mg IV x 1 30 minutes prior to Vancomycin  infusion - Start Dapsone  for PJP prophylaxis (has tolerated previously) - Will continue to follow renal function, culture results, LOT, and antibiotic de-escalation plans      Temp (24hrs), Avg:101.5 F (38.6 C), Min:99.9 F (37.7 C), Max:103.1 F (39.5 C)  Recent Labs  Lab 01/16/24 1421 01/16/24 1453 01/16/24 1529  WBC 9.1  --   --   CREATININE  --  1.14* 1.10*  LATICACIDVEN  --   --  0.9    CrCl cannot be calculated (Unknown ideal weight.).    Allergies[1]  Antimicrobials this admission: Vancomycin  12/14 >> Cefepime  12/14 >> Atovaquone  12/14 >> 12/15  Thank you for allowing pharmacy to be a part of this patients care.  Almarie Lunger, PharmD, BCPS, BCIDP Infectious Diseases Clinical Pharmacist 01/17/2024 12:36 PM   **Pharmacist phone directory can now be found on [email or web address] ([email or web address]).  Listed under [facility].      [1]  Allergies Allergen Reactions   Levaquin [Levofloxacin In D5w] Shortness Of Breath and Rash   Sulfa Antibiotics Swelling    Facial swelling and burning   Vancomycin  Itching, Swelling and Other (See Comments)    Face swelled turned red with burning and itching   Zithromax [Azithromycin] Shortness Of Breath, Swelling and Other (See Comments)    Pt reports tightness in skin with redness    Bactrim [Sulfamethoxazole-Trimethoprim] Swelling and Other (See Comments)    Pt reports that she was placed on Bactrim for pneumonia when she was d/c from [facility] and developed a red face with swelling and can not tolerate Bactrim.    Darunavir  Rash

## 2024-01-17 NOTE — ED Notes (Signed)
 Pt refuses to have occult blood test completed.
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
## 2024-01-18 ENCOUNTER — Other Ambulatory Visit (HOSPITAL_COMMUNITY): Payer: Self-pay

## 2024-01-18 LAB — RESPIRATORY PANEL BY PCR

## 2024-01-18 LAB — HIV-1/HIV-2 QUAL RNA
HIV-1 RNA, Qualitative: REACTIVE — AB
HIV-2 RNA, Qualitative: NONREACTIVE

## 2024-01-18 LAB — CYTOMEGALOVIRUS DNA, QUANTITATIVE REAL-TIME PCR, PLASMA
CMV DNA Quant: NEGATIVE [IU]/mL
Log10 CMV Qn DNA Pl: UNDETERMINED {Log_IU}/mL

## 2024-01-18 LAB — MRSA NEXT GEN BY PCR, NASAL: MRSA by PCR Next Gen: NOT DETECTED

## 2024-01-18 NOTE — Progress Notes (Signed)
 Physical Therapy Treatment and Discharge Note Patient Details Name: Frances Ball MRN: [id number] DOB: Dec 05, 1990 Today's Date: 01/18/2024   History of Present Illness Pt is 33 yo F adm 01/16/24 with cough congestion and fever, FTT, hypoxia. Pt with multifocal PNA. PMH: IV drug use hx, VTE, factor V Leiden, HIV AIDS, chronic bronchiectasis, anemia, hep C, hospitalized 08/2023, PTSD    PT Comments  Pt is currently presenting at Ind to Mod I for bed mobility, sit to stand and gait without an AD. Briefly pt stabilizes herself upon standing with light UE support. Pt BP remained steady and appropriately increased with activity. HR remained slightly elevated without significant elevation with activity. Currently pt is presenting at Mod I to Ind level of functioning and no skilled physical therapy services recommended. Pt will be discharged from skilled physical therapy services at this time; please re-consult if further needs arise.       If plan is discharge home, recommend the following: Assistance with cooking/housework     Equipment Recommendations  None recommended by PT       Precautions / Restrictions Precautions Precautions: Other (comment) Recall of Precautions/Restrictions: Intact Precaution/Restrictions Comments: immunocompromised Restrictions Weight Bearing Restrictions Per Provider Order: No     Mobility  Bed Mobility Overal bed mobility: Independent      Transfers Overall transfer level: Modified independent Equipment used: None Transfers: Sit to/from Stand Sit to Stand: Modified independent (Device/Increase time)           General transfer comment: stabilizes briefly with UE on standing.    Ambulation/Gait Ambulation/Gait assistance: Modified independent (Device/Increase time) Gait Distance (Feet): 30 Feet Assistive device: None Gait Pattern/deviations: Step-through pattern, Decreased stride length Gait velocity: slightly decreased Gait velocity  interpretation: 1.31 - 2.62 ft/sec, indicative of limited community ambulator   General Gait Details: 30 min of gait. Pt reports brief dizziness upon first standing that resolves and is at her baseline.   Stairs Stairs:  (pt demonstrates good strength with sit to stand and gait demonstrating ability to perform stairs with Min A if no rails and CGA to supervision with rails.)            Balance Overall balance assessment: Mild deficits observed, not formally tested      Communication Communication Communication: No apparent difficulties  Cognition Arousal: Alert Behavior During Therapy: Flat affect   PT - Cognitive impairments: No apparent impairments     Following commands: Intact      Cueing Cueing Techniques: Verbal cues     General Comments General comments (skin integrity, edema, etc.): SPO2 remained in the 90s on 0.5 L O2 via [place]. pt was able to ambulate 30 ft on room air without drop in SPO2. HR up to -116 bpm, BP supine 90/72, sitting: 98/74, standing 98/75, after 3 min 109/69      Pertinent Vitals/Pain Pain Assessment Pain Assessment: No/denies pain    Home Living Family/patient expects to be discharged to:: Private residence Living Arrangements: Parent;Children           PT Goals (current goals can now be found in the care plan section) Progress towards PT goals: Goals met/education completed, patient discharged from PT    Frequency    Min 2X/week      PT Plan  Pt will be discharged from PT services at this time.        AM-PAC PT 6 Clicks Mobility   Outcome Measure  Help needed turning from your back to your side while in  a flat bed without using bedrails?: None Help needed moving from lying on your back to sitting on the side of a flat bed without using bedrails?: None Help needed moving to and from a bed to a chair (including a wheelchair)?: None Help needed standing up from a chair using your arms (e.g., wheelchair or bedside chair)?:  None Help needed to walk in hospital room?: None Help needed climbing 3-5 steps with a railing? : A Little 6 Click Score: 23    End of Session Equipment Utilized During Treatment: Gait belt;Oxygen Activity Tolerance: Patient tolerated treatment well;Other (comment) (pt reports she is independent and would like to stay in room.) Patient left: in bed;with call bell/phone within reach;with family/visitor present Nurse Communication: Mobility status PT Visit Diagnosis: Unsteadiness on feet (R26.81);Muscle weakness (generalized) (M62.81);Dizziness and giddiness (R42);Difficulty in walking, not elsewhere classified (R26.2)     Time: 8671-8658 PT Time Calculation (min) (ACUTE ONLY): 13 min  Charges:    $Therapeutic Activity: 8-22 mins PT General Charges $$ ACUTE PT VISIT: 1 Visit                     Dorothyann Maier, DPT, CLT  Acute Rehabilitation Services Office: [phone] (Secure chat preferred)    Dorothyann VEAR Maier 01/18/2024, 1:47 PM

## 2024-01-18 NOTE — Evaluation (Signed)
 Occupational Therapy Evaluation Patient Details Name: Frances Ball MRN: [id number] DOB: [birth year] Today's Date: 01/18/2024   History of Present Illness   Pt is 33 yo F adm 01/16/24 with cough congestion and fever, FTT, hypoxia. Pt with multifocal PNA. PMH: IV drug use hx, VTE, factor V Leiden, HIV AIDS, chronic bronchiectasis, anemia, hep C, hospitalized 08/2023, PTSD     Clinical Impressions PTA, pt reports being independent in ADL, living with her mother in a trailer without any STE and a tub shower. Pt reports that she does not work due to living with chronic conditions. Pt needing up to supervision A for transfer, but deferred walking to sink, restroom or out of room reporting she was dizzy with standing tasks. Suspect pt will progress well and reports that she has been able to walk to restroom when she is not experiencing dizziness. Will continue to follow to optimize progression. Do not suspect need for follow up OT after discharge.      If plan is discharge home, recommend the following:   A little help with walking and/or transfers;A little help with bathing/dressing/bathroom;Assistance with cooking/housework;Assist for transportation     Functional Status Assessment   Patient has had a recent decline in their functional status and demonstrates the ability to make significant improvements in function in a reasonable and predictable amount of time.     Equipment Recommendations   None recommended by OT     Recommendations for Other Services         Precautions/Restrictions   Precautions Precautions: Other (comment) Recall of Precautions/Restrictions: Intact Precaution/Restrictions Comments: immunocompromised Restrictions Weight Bearing Restrictions Per Provider Order: No     Mobility Bed Mobility Overal bed mobility: Modified Independent                  Transfers Overall transfer level: Needs assistance Equipment used: None Transfers: Sit  to/from Stand Sit to Stand: Supervision           General transfer comment: S for safety only 2/2 report of dizziness      Balance Overall balance assessment: Mild deficits observed, not formally tested                                         ADL either performed or assessed with clinical judgement   ADL Overall ADL's : Needs assistance/impaired Eating/Feeding: Sitting;Independent   Grooming: Set up;Sitting   Upper Body Bathing: Sitting;Modified independent   Lower Body Bathing: Supervison/ safety;Sit to/from stand   Upper Body Dressing : Sitting;Modified independent   Lower Body Dressing: Supervision/safety;Sit to/from Market Researcher Details (indicate cue type and reason): limited mobility ths session secondary to soft BPs and pt with dizziness but reports improved from yesterday                 Vision Baseline Vision/History: 0 No visual deficits Ability to See in Adequate Light: 0 Adequate Patient Visual Report: No change from baseline Vision Assessment?: No apparent visual deficits     Perception Perception: Not tested       Praxis Praxis: Not tested       Pertinent Vitals/Pain Pain Assessment Pain Assessment: Faces Faces Pain Scale: No hurt     Extremity/Trunk Assessment Upper Extremity Assessment Upper Extremity Assessment: Generalized weakness   Lower Extremity Assessment Lower Extremity Assessment: Generalized weakness   Cervical / Trunk Assessment Cervical /  Trunk Assessment: Normal   Communication Communication Communication: No apparent difficulties   Cognition Arousal: Alert Behavior During Therapy: Flat affect Cognition: No apparent impairments             OT - Cognition Comments: follows up to 2 step commands this session. pleasant, conversational, and oriented                 Following commands: Intact       Cueing  General Comments   Cueing Techniques: Verbal cues  SpO2 96%  on .5 L supplemental O2 via [place]. BP supine: 89/64 (72); seated 85/67 (74) HR 116; standing 91/69 (77) HR 124   Exercises     Shoulder Instructions      Home Living Family/patient expects to be discharged to:: Private residence Living Arrangements: Parent;Children Available Help at Discharge: Family;Available 24 hours/day Type of Home: Mobile home Home Access:  (pt reports level entry)     Home Layout: One level     Bathroom Shower/Tub: Chief Strategy Officer: Standard     Home Equipment:  (unsure)   Additional Comments: pt reports that she lives with her mom but chart mentions that she is homeless. 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      Prior Functioning/Environment Prior Level of Function : Independent/Modified Independent               ADLs Comments: does not drive or work    OT Problem List: Decreased strength;Decreased activity tolerance;Impaired balance (sitting and/or standing)   OT Treatment/Interventions: Self-care/ADL training;Therapeutic exercise;DME and/or AE instruction;Therapeutic activities;Patient/family education;Balance training      OT Goals(Current goals can be found in the care plan section)   Acute Rehab OT Goals Patient Stated Goal: feel better OT Goal Formulation: With patient Time For Goal Achievement: 02/01/24 Potential to Achieve Goals: Good   OT Frequency:  Min 2X/week (initially)    Co-evaluation              AM-PAC OT 6 Clicks Daily Activity     Outcome Measure Help from another person eating meals?: None Help from another person taking care of personal grooming?: A Little Help from another person toileting, which includes using toliet, bedpan, or urinal?: A Little Help from another person bathing (including washing, rinsing, drying)?: A Little Help from another person to put on and taking off regular upper body clothing?: None Help from another person to put on and taking off regular lower  body clothing?: A Little 6 Click Score: 20   End of Session Nurse Communication: Mobility status  Activity Tolerance: Patient tolerated treatment well Patient left: in bed;with call bell/phone within reach;with bed alarm set  OT Visit Diagnosis: Unsteadiness on feet (R26.81);Muscle weakness (generalized) (M62.81)                Time: 9188-9170 OT Time Calculation (min): 18 min Charges:  OT General Charges $OT Visit: 1 Visit OT Evaluation $OT Eval Low Complexity: 1 Low  Frances Ball, OTR/L [facility] Acute Rehabilitation Office: [phone]   Frances JONETTA Lebron 01/18/2024, 8:47 AM

## 2024-01-18 NOTE — Progress Notes (Signed)
 PROGRESS NOTE    Frances Ball  FMW:[id number] DOB: 12/31/1990 DOA: 01/16/2024 PCP: Melvenia Corean SAILOR, NP   Brief Narrative:   Frances Ball is a 33 y.o. female with past medical history  of IV drug use history, factor V Leiden without history of VTE, HIV/AIDS and noncompliant with ART regimen, chronic bronchiectasis, anemia, hep C, failure to thrive coming to ER on 12/14 for cough/ sob and hypoxia.   Similar admission in July 2025 of this year with recent admission shortness of breath cough, patient has multiple social issues with homelessness transport limited ID follow-up and other appointments.  Per July note patient was on Biktarvy  and had not been taking.  History of bronchiectasis since 2020.   August 30, 2023-respiratory panel negative for flu RSV and COVID. August 30, 2023-blood cultures no growth 5 days.  August 31, 2023-20 pathogen respiratory panel PCR negative. August 31, 2023-MRSA PCR negative. July 29 serum Blastomyces antigen-negative. July 29 expectorated sputum-negative culture, rare gram-positive cocci and rare gram-negative rods. July 30 MTB AFB sputum x 3-negative and no acid-fast bacilli isolated after 6 weeks. September 01, 2023 respiratory culture shows few WBCs moderate gram-negative rods rare gram-positive cocci is identified as Staph aureus that are pansensitive and resistant tetracycline. Crypto antigen negative.     In the ER pt was febrile, tachypneic, tachycardic, borderline hypotension and received IV fluid bolus per sepsis protocol, initiated on ceftriaxone  cefepime  and vancomycin  for multifocal pneumonia noted on CT.  Also multiple pulmonary nodules noted.  ID consulted and started on dapsone .  Evaluated by pulmonary critical care as well  Assessment & Plan:   Principal Problem:   Multifocal pneumonia Active Problems:   HIV disease (HCC)   Sepsis with acute hypoxic respiratory failure without septic shock (HCC)   Immunocompromised   33 year old female with  history of HIV and AIDS noncompliance protein calorie malnutrition presenting with fever cough shortness of breath and found to have recurrent pneumonia.  >> Multifocal pneumonia/bronchiectasis: Recurrent pneumonia in immunocompromised HIV patient,   Will continue cefepime , vancomycin , dapsone , symptoms are improving, patient is afebrile now. CMV PCR negative, viral respiratory panel PCR negative, influenza A, influenza B, RSV, COVID-19 negative, Fungitell beta D glucan pending, blood culture x 2 no growth so far, CD4 T-cell 148,  PCCM, ID on board  Sepsis secondary to above: Received fluid boluses.  This is resolved.  Lactic acid reassuring at 0.9 on presentation.  >>HIV/AID: Obtain viral load / cd4 and started biktarvy  and dapsone   >> Hepatitis C: Patient being followed by infectious disease.     >> Protein calorie malnutrition: Nutrition is consulted regular diet.     >> Low blood pressures: Suspect this may be patient's baseline due to her low BMI.  Continue gentle IV fluid.   >> AKI: Again we will continue with gentle IV fluid hydration creatinine is mildly elevated at 1.14 eGFR is more than 60.     Low calcium, magnesium : Follow-up with repeat labs  >> Anemia: Suspect anemia of chronic disease, hemoglobin is 13.6 today.  DVT prophylaxis:  Heparin  every 12h.   Consults:  Pulmonology. ID.    Advance Care Planning:    Code Status: Full Code    Family Communication:  None Disposition Plan:  To be determined Severity of Illness: The appropriate patient status for this patient is INPATIENT. Inpatient status is judged to be reasonable and necessary in order to provide the required intensity of service to ensure the patient's safety. The patient's presenting symptoms, physical  exam findings, and initial radiographic and laboratory data in the context of their chronic comorbidities is felt to place them at high risk for further clinical deterioration. Furthermore, it is  not anticipated that the patient will be medically stable for discharge from the hospital within 2 midnights of admission.    * I certify that at the point of admission it is my clinical judgment that the patient will require inpatient hospital care spanning beyond 2 midnights from the point of admission due to high intensity of service, high risk for further deterioration and high frequency of surveillance required.*     Consultants: ID, PCCM  Subjective:  Patient seen and examined at the bedside.  Vital signs are stable today.  She is no longer febrile.  Tachycardia also improved.  Blood pressure is not 90s to 100 systolic.  Patient has some cough, mostly dry.  Denies vomiting.  Has couple of loose stools today.  Objective: Vitals:   01/18/24 0400 01/18/24 0428 01/18/24 0742 01/18/24 1148  BP:  91/65 97/66 93/63   Pulse:   (!) 107 (!) 103  Resp: 20 16 18 16   Temp: 99 F (37.2 C) 99.9 F (37.7 C) 99 F (37.2 C) 100.3 F (37.9 C)  TempSrc: Oral Oral Oral Oral  SpO2:  100% 99% 99%  Weight:      Height:        Intake/Output Summary (Last 24 hours) at 01/18/2024 1258 Last data filed at 01/18/2024 1225 Gross per 24 hour  Intake 3147.82 ml  Output --  Net 3147.82 ml   Filed Weights   01/17/24 1206  Weight: 45.4 kg    Examination:  General: Alert, oriented, not in any distress  chest: Mild crackles bilaterally, no wheezing CVS: S1, S2, t regular Abdomen: Soft, nontender Extremities: No edema  Data Reviewed: I have personally reviewed following labs and imaging studies  CBC: Recent Labs  Lab 01/16/24 1421 01/16/24 1529  WBC 9.1  --   NEUTROABS 7.6  --   HGB 11.8* 13.6  HCT 37.2 40.0  MCV 81.2  --   PLT 203  --    Basic Metabolic Panel: Recent Labs  Lab 01/16/24 1453 01/16/24 1529 01/16/24 2027  NA 135 137  --   K 3.5 3.5  --   CL 96* 97*  --   CO2 28  --   --   GLUCOSE 114* 112*  --   BUN 11 10  --   CREATININE 1.14* 1.10*  --   CALCIUM 8.5*  --    --   MG  --   --  1.5*   GFR: Estimated Creatinine Clearance: 51.6 mL/min (A) (by C-G formula based on SCr of 1.1 mg/dL (H)). Liver Function Tests: Recent Labs  Lab 01/16/24 1453  AST 19  ALT 7  ALKPHOS 53  BILITOT 0.5  PROT 7.9  ALBUMIN  3.6   No results for input(s): LIPASE, AMYLASE in the last 168 hours. No results for input(s): AMMONIA in the last 168 hours. Coagulation Profile: No results for input(s): INR, PROTIME in the last 168 hours. Cardiac Enzymes: No results for input(s): CKTOTAL, CKMB, CKMBINDEX, TROPONINI in the last 168 hours. BNP (last 3 results) No results for input(s): PROBNP in the last 8760 hours. HbA1C: No results for input(s): HGBA1C in the last 72 hours. CBG: No results for input(s): GLUCAP in the last 168 hours. Lipid Profile: No results for input(s): CHOL, HDL, LDLCALC, TRIG, CHOLHDL, LDLDIRECT in the last 72 hours. Thyroid  Function  Tests: Recent Labs    01/16/24 2027  TSH 1.316  FREET4 0.96   Anemia Panel: Recent Labs    01/16/24 2027  VITAMINB12 210  FOLATE 4.9*  FERRITIN 78  TIBC 204*  IRON 23*  RETICCTPCT 0.7   Sepsis Labs: Recent Labs  Lab 01/16/24 1529 01/16/24 2027  PROCALCITON  --  4.63  LATICACIDVEN 0.9  --     Recent Results (from the past 240 hours)  Resp panel by RT-PCR (RSV, Flu A&B, Covid) Anterior Nasal Swab     Status: None   Collection Time: 01/16/24  2:21 PM   Specimen: Anterior Nasal Swab  Result Value Ref Range Status   SARS Coronavirus 2 by RT PCR NEGATIVE NEGATIVE Final   Influenza A by PCR NEGATIVE NEGATIVE Final   Influenza B by PCR NEGATIVE NEGATIVE Final    Comment: (NOTE) The Xpert Xpress SARS-CoV-2/FLU/RSV plus assay is intended as an aid in the diagnosis of influenza from Nasopharyngeal swab specimens and should not be used as a sole basis for treatment. Nasal washings and aspirates are unacceptable for Xpert Xpress SARS-CoV-2/FLU/RSV testing.  Fact Sheet  for Patients: bloggercourse.com  Fact Sheet for Healthcare Providers: seriousbroker.it  This test is not yet approved or cleared by the [place]  FDA and has been authorized for detection and/or diagnosis of SARS-CoV-2 by FDA under an Emergency Use Authorization (EUA). This EUA will remain in effect (meaning this test can be used) for the duration of the COVID-19 declaration under Section 564(b)(1) of the Act, 21 U.S.C. section 360bbb-3(b)(1), unless the authorization is terminated or revoked.     Resp Syncytial Virus by PCR NEGATIVE NEGATIVE Final    Comment: (NOTE) Fact Sheet for Patients: bloggercourse.com  Fact Sheet for Healthcare Providers: seriousbroker.it  This test is not yet approved or cleared by the [place]  FDA and has been authorized for detection and/or diagnosis of SARS-CoV-2 by FDA under an Emergency Use Authorization (EUA). This EUA will remain in effect (meaning this test can be used) for the duration of the COVID-19 declaration under Section 564(b)(1) of the Act, 21 U.S.C. section 360bbb-3(b)(1), unless the authorization is terminated or revoked.  Performed at [facility], [address] [place], [place] [address]   Blood culture (routine x 2)     Status: None (Preliminary result)   Collection Time: 01/16/24  3:15 PM   Specimen: BLOOD  Result Value Ref Range Status   Specimen Description BLOOD RIGHT ANTECUBITAL  Final   Special Requests   Final    BOTTLES DRAWN AEROBIC AND ANAEROBIC Blood Culture results may not be optimal due to an inadequate volume of blood received in culture bottles   Culture   Final    NO GROWTH 2 DAYS Performed at [facility], [address] [place], [place] [address]    Report Status PENDING  Incomplete         Radiology Studies: CT Angio Chest PE W and/or Wo Contrast Result Date: 01/16/2024 EXAM:  CTA of the Chest with contrast for PE 01/16/2024 06:41:47 PM TECHNIQUE: CTA of the chest was performed after the administration of 75 mL of iohexol  (OMNIPAQUE ) 350 MG/ML injection. Multiplanar reformatted images are provided for review. MIP images are provided for review. Automated exposure control, iterative reconstruction, and/or weight based adjustment of the mA/kV was utilized to reduce the radiation dose to as low as reasonably achievable. COMPARISON: CT chest 08/30/2023. CLINICAL HISTORY: rule out PE FINDINGS: PULMONARY ARTERIES: Pulmonary arteries are adequately  opacified for evaluation. No pulmonary embolism. Main pulmonary artery is normal in caliber. MEDIASTINUM: The heart and pericardium demonstrate no acute abnormality. There is no acute abnormality of the thoracic aorta. LYMPH NODES: Enlarged right hilar lymph nodes measuring up to 13 mm. LUNGS AND PLEURA: Airspace consolidation in the right middle lobe, right lower lobe and minimally in the left lower lobe with air bronchograms. Additional tree-in-bud opacities and patchy airspace opacities are seen throughout the superior segment of the left lower lobe. New nodular densities in the left upper lobe measuring up to 6 mm on image 6/56. Secretion/occlusion of the right lower lobe bronchus. Peribronchial wall thickening in the right lower lobe and right middle lobe. New right upper lobe nodular density measuring 6 mm image 6/39. Calcified granuloma in the right upper lobe. No pleural effusion or pneumothorax. UPPER ABDOMEN: Limited images of the upper abdomen are unremarkable. SOFT TISSUES AND BONES: No acute bone or soft tissue abnormality. IMPRESSION: 1. No evidence of pulmonary embolism. 2. Airspace consolidation in the right middle lobe, right lower lobe, and minimally in the left lower lobe with air bronchograms compatible with multifocal pneumonia . 3. Additional tree-in-bud opacities and patchy airspace opacities in the superior segment of the left  lower lobe compatible with infection . 4. Secretions in the right lower lobe bronchus likely related to aspiration. Follow-up recommended to exclude underlying lesion . 5. Multiple new solid pulmonary nodules up to 6 mm. Recommend non-contrast chest CT at 3-6 months, then consider an additional non-contrast chest CT at 18-24 months per Fleischner Society Guidelines. 6. Enlarged right hilar lymph nodes measuring up to 13 mm. Electronically signed by: Greig Pique MD 01/16/2024 07:08 PM EST RP Workstation: [id number]   DG Chest 2 View Result Date: 01/16/2024 EXAM: 2 VIEW(S) XRAY OF THE CHEST 01/16/2024 03:00:00 PM COMPARISON: 08/30/2023 CLINICAL HISTORY: shob FINDINGS: LUNGS AND PLEURA: There are trace bilateral pleural effusions. There is right middle lobe airspace consolidation and a small amount of patchy airspace disease in the right lower lobe. No pneumothorax. HEART AND MEDIASTINUM: No acute abnormality of the cardiac and mediastinal silhouettes. BONES AND SOFT TISSUES: No acute osseous abnormality. IMPRESSION: 1. Right middle lobe consolidation with additional patchy airspace disease in the right lower lobe, compatible with pneumonia. Follow-up imaging recommended in 4 to 6 weeks to confirm complete resolution. 2. Trace bilateral pleural effusions. Electronically signed by: Greig Pique MD 01/16/2024 03:09 PM EST RP Workstation: [id number]        Scheduled Meds:  bictegravir-emtricitabine -tenofovir  AF  1 tablet Oral Daily   dapsone   100 mg Oral Daily   diphenhydrAMINE   25 mg Intravenous Q24H   heparin   5,000 Units Subcutaneous Q12H   pantoprazole  (PROTONIX ) IV  20 mg Intravenous Q12H   potassium chloride   40 mEq Oral Once   Continuous Infusions:  sodium chloride  75 mL/hr at 01/18/24 1225   ceFEPime  (MAXIPIME ) IV 2 g (01/18/24 1023)   magnesium  sulfate bolus IVPB     vancomycin  Stopped (01/17/24 2352)          Derryl Duval, MD [facility] 01/18/2024, 12:58 PM

## 2024-01-18 NOTE — TOC Initial Note (Addendum)
 Transition of Care [facility]) - Initial/Assessment Note    Patient Details  Name: Frances Ball MRN: [id number] Date of Birth: August 12, 1990  [facility] CM/SW Contact:    Andrez JULIANNA George, RN Phone Number: 01/18/2024, 11:02 AM  Clinical Narrative:                 Frances Ball is a 33 y.o. female with past medical history  of IV drug use history, factor V Leiden without history of VTE, HIV/AIDS and noncompliant with ART regimen, chronic bronchiectasis, anemia, hep C, failure to thrive coming for cough/ sob and hypoxia.   Pt is from home with her mom and minor children.  No DME.  She manages her own medications and says she is compliant but per notes she doesn't take her meds as prescribed.  Pt uses ID clinic for PCP and doesn't want any other PCP arranged.  Her brother provides needed transportation.   Ip care management following for d/c needs.   Expected Discharge Plan: Home/Self Care     Patient Goals and CMS Choice            Expected Discharge Plan and Services       Living arrangements for the past 2 months: Mobile Home                                      Prior Living Arrangements/Services Living arrangements for the past 2 months: Mobile Home Lives with:: Minor Children, Parents Patient language and need for interpreter reviewed:: Yes Do you feel safe going back to the place where you live?: Yes            Criminal Activity/Legal Involvement Pertinent to Current Situation/Hospitalization: No - Comment as needed  Activities of Daily Living      Permission Sought/Granted                  Emotional Assessment Appearance:: Appears stated age Attitude/Demeanor/Rapport: Engaged Affect (typically observed): Accepting Orientation: : Oriented to Self, Oriented to Place, Oriented to  Time, Oriented to Situation   Psych Involvement: No (comment)  Admission diagnosis:  Shortness of breath [R06.02] Pneumonia [J18.9] Tachycardia  [R00.0] Multifocal pneumonia [J18.8] Patient Active Problem List   Diagnosis Date Noted   Multifocal pneumonia 01/16/2024   Immunocompromised 01/16/2024   High anion gap metabolic acidosis 08/31/2023   Chronic pain syndrome 08/31/2023   CAP (community acquired pneumonia) 08/30/2023   Pre-syncope 08/28/2022   Scoliosis of thoracolumbar spine 08/28/2022   Nausea and vomiting 12/31/2021   Pancytopenia (HCC) 12/31/2021   Chest pain 12/31/2021   History of opioid abuse (HCC) 12/31/2021   Factor 5 Leiden mutation, heterozygous 07/28/2021   Weight loss 07/24/2021   PNA (pneumonia) 06/24/2021   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   Factor V deficiency (HCC) 09/05/2020   Anemia of chronic disease 09/03/2020   Palpitations 03/15/2020   Migraines 11/22/2018   SOB (shortness of breath) 04/12/2018   Bronchiectasis (HCC) 03/28/2018   Healthcare maintenance 06/02/2017   Acute hyponatremia 10/27/2016   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 [place] [address] Phone: [phone] Fax: [phone]  [facility] [address] [place] [address] Phone: [phone] Fax: [phone]     Social Drivers of Health (SDOH) Social History: SDOH Screenings   Food Insecurity: No Food Insecurity (08/31/2023)  Housing: Low Risk (08/31/2023)  Transportation Needs: No Transportation Needs (08/31/2023)  Utilities: Not At Risk (08/31/2023)  Depression (PHQ2-9): Low Risk (08/28/2022)  Tobacco  Use: Low Risk (01/16/2024)   SDOH Interventions:     Readmission Risk Interventions    09/03/2023    3:17 PM  Readmission Risk Prevention Plan  PCP or Specialist Appt within 5-7 Days Complete  Home Care Screening Complete  Medication Review (RN CM) Complete

## 2024-01-19 ENCOUNTER — Other Ambulatory Visit (HOSPITAL_COMMUNITY): Payer: Self-pay

## 2024-01-19 DIAGNOSIS — N179 Acute kidney failure, unspecified: Secondary | ICD-10-CM | POA: Diagnosis not present

## 2024-01-19 DIAGNOSIS — J479 Bronchiectasis, uncomplicated: Secondary | ICD-10-CM | POA: Diagnosis not present

## 2024-01-19 DIAGNOSIS — Z91199 Patient's noncompliance with other medical treatment and regimen due to unspecified reason: Secondary | ICD-10-CM | POA: Diagnosis not present

## 2024-01-19 DIAGNOSIS — J188 Other pneumonia, unspecified organism: Secondary | ICD-10-CM | POA: Diagnosis not present

## 2024-01-19 DIAGNOSIS — B20 Human immunodeficiency virus [HIV] disease: Secondary | ICD-10-CM | POA: Diagnosis not present

## 2024-01-19 MED ORDER — BIKTARVY 50-200-25 MG PO TABS
1.0000 | ORAL_TABLET | Freq: Every day | ORAL | 0 refills | Status: AC
  Filled 2024-01-19: qty 30, 30d supply, fill #0

## 2024-01-19 NOTE — Hospital Course (Signed)
 Frances Ball is a 33 y.o. female with a history of IV drug use, factor V Leiden without history of VTE, HIV, AIDS, chronic bronchiectasis, anemia, hepatitis C, failure to thrive.  Patient presented secondary to cough and shortness of breath with CT evidence of multifocal pneumonia.  Patient start empirically on antibiotics and infectious ease consulted for assistance in management.  Patient was transition to cefepime  for continued treatment of pneumonia in setting of underlying bronchiectasis.  Patient with steady improvement.  Pulmonology consulted for consideration of bronchoscopy.

## 2024-01-19 NOTE — Progress Notes (Signed)
° °  Brief Progress Note   _____________________________________________________________________________________________________________  Patient Name: Frances Ball Patient DOB: [birth year] Date: @TODAY @  Pt is scheduled to be d/c home on 01/20/2024, she needs a bronchoscopy given on going S/S _____________________________________________________________________________________________________________  [facility] RN Expeditor Ronal DELENA Bald Please contact us  directly via secure chat (search for [facility]) or by calling us  at [phone] [facility]).

## 2024-01-19 NOTE — H&P (View-Only) (Signed)
 Name: PEREL HAUSCHILD MRN: [id number] DOB: May 09, 1990    ADMISSION DATE:  01/16/2024 CONSULTATION DATE:  01/16/2024  REFERRING MD :  Dr. 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  SIGNIFICANT EVENTS  12/14: Admitted to Step Down Unit for management of pneumonia c/b sepsis. Started on vanc, ceftriaxone , doxycyline (macrolide allergy), and atovaquone  (Bactrim allergy).   STUDIES:  12/14: CT Chest with consolidation and collapse of RLL > RML, with obstructing secretions in RLL bronchus. Additional scattered tree and bud opacities superior to the area of dense consolidation.  HISTORY OF PRESENT ILLNESS:  56 F with HIV/AIDS (non-adherent to Biktarvy  for at least the past 2 months due to side effects per patient report), h/o IVDU, Factor V Leiden, and HCV who presented with shortness of breath, productive cough, and mild hypoxemia.   CT Chest revealed a RLL/RML pneumonia with appearance of obstructing secretions in the RLL bronchus.   Vitals notable for robust sinus tachycardia to 120-140s and marginal blood pressures (mid to high 80s systolic BP, compared with a patient-reported baseline of low 100s SBP which is what her SBP was on arrival to the ED earlier in the day). Tmax of 103.1 F on arrival to the ED.  She was given cefepime  and vancomycin  in the ED and admitted to the hospitalist service.  The patient endorses the same history as above during my interview. She denies any hemoptysis (both now or ever in the past) but is bringing up phlegm. She endorses nausea, but denies any constipation or vomiting or diarrhea. She endorses some new sharp R chest wall discomfort that she appreciates when she changes position but denies a pleuritic or constant characteristic to the pain. She denies any known history of either  pneumocystis or candida infections.   With respect to her ART therapy, she states she was on Biktarvy  until 2 months ago when she started to feel strange / experience side effects when taking this (which she felt was unusual as she had not experienced these in the past when on Biktarvy ) and she stopped taking it until she could talk with her ID provider Corean Fireman, NP. Specifically, her concern was that these side effects might be an indication that she had developed resistance/mutations.  Her last CD4+ count in our records was 110 on 06/25/2021. She thinks she may have had more recent CD4+ count performed by a provider she refers to as Dr. Morene (she's unsure if Morene is last name or first name) who sees in [place].  PAST MEDICAL HISTORY :   has a past medical history of Clostridium difficile colitis, HCAP (healthcare-associated pneumonia) (03/15/2015), History of blood transfusion (02/03/2007), HIV (human immunodeficiency virus infection) (HCC) (dx'd 2009), Influenza A (H1N1), Intravenous drug user, Neuralgia, post-herpetic, Pneumonia (02/2014; 01/2015), PTSD (post-traumatic stress disorder), and Trichomonas vaginitis.  has a past surgical history that includes Cesarean section (2011; 2013); Tonsillectomy and adenoidectomy (1990s); Appendectomy (1990s); and Peripherally inserted central catheter insertion (Right, 06/2014; 01/2014; 03/15/2015).  SUBJECTIVE:   VITAL SIGNS: Temp:  [98.1 F (36.7 C)-98.7 F (37.1 C)] 98.7 F (37.1 C) (12/17 1145) Pulse Rate:  [79-93] 87 (12/17 1145) Resp:  [16-18] 16 (12/17 1145) BP: (89-104)/(61-77) 104/77 (12/17 1145) SpO2:  [98 %-100 %] 98 % (12/17 1145)  PHYSICAL EXAMINATION: General: malnourished, no acute distress, sitting in bed HEENT MMM, sclera anicteric Cardiovascular:  regular rate and rhythm  Lungs: breathing comfortably on room air, diminished right base  Abdomen: soft, non-tender, non-distended  Musculoskeletal: warm, dry, no edema    Recent Labs  Lab 01/16/24 1453 01/16/24 1529  NA 135 137  K 3.5 3.5  CL 96* 97*  CO2 28  --   BUN 11 10  CREATININE 1.14* 1.10*  GLUCOSE 114* 112*   Recent Labs  Lab 01/16/24 1421 01/16/24 1529  HGB 11.8* 13.6  HCT 37.2 40.0  WBC 9.1  --   PLT 203  --    No results found.   ASSESSMENT / PLAN:  # Multifocal pneumonia # Immunocompromised  Infectious work-up negative thus far. Currently breathing comfortably on room air.  - Continue antimicrobials per ID  - Plan for therapeutic bronch tomorrow  - Follow-up fungitell  # HIV CD4 T Cell Abs 148 on 12/14.  - Resumed home ART therapy  - Dapsone  per ID for PJP prophylaxis   Rexene LOISE Blush, PA-C [facility] 01/19/2024 12:23 PM  Please see [email or web address] for pager details.  From 7A-7P if no response, please call [phone] After hours, please call ELink [phone]

## 2024-01-19 NOTE — Consult Note (Signed)
 Name: Frances Ball MRN: [id number] DOB: May 09, 1990    ADMISSION DATE:  01/16/2024 CONSULTATION DATE:  01/16/2024  REFERRING MD :  Dr. 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  SIGNIFICANT EVENTS  12/14: Admitted to Step Down Unit for management of pneumonia c/b sepsis. Started on vanc, ceftriaxone , doxycyline (macrolide allergy), and atovaquone  (Bactrim allergy).   STUDIES:  12/14: CT Chest with consolidation and collapse of RLL > RML, with obstructing secretions in RLL bronchus. Additional scattered tree and bud opacities superior to the area of dense consolidation.  HISTORY OF PRESENT ILLNESS:  56 F with HIV/AIDS (non-adherent to Biktarvy  for at least the past 2 months due to side effects per patient report), h/o IVDU, Factor V Leiden, and HCV who presented with shortness of breath, productive cough, and mild hypoxemia.   CT Chest revealed a RLL/RML pneumonia with appearance of obstructing secretions in the RLL bronchus.   Vitals notable for robust sinus tachycardia to 120-140s and marginal blood pressures (mid to high 80s systolic BP, compared with a patient-reported baseline of low 100s SBP which is what her SBP was on arrival to the ED earlier in the day). Tmax of 103.1 F on arrival to the ED.  She was given cefepime  and vancomycin  in the ED and admitted to the hospitalist service.  The patient endorses the same history as above during my interview. She denies any hemoptysis (both now or ever in the past) but is bringing up phlegm. She endorses nausea, but denies any constipation or vomiting or diarrhea. She endorses some new sharp R chest wall discomfort that she appreciates when she changes position but denies a pleuritic or constant characteristic to the pain. She denies any known history of either  pneumocystis or candida infections.   With respect to her ART therapy, she states she was on Biktarvy  until 2 months ago when she started to feel strange / experience side effects when taking this (which she felt was unusual as she had not experienced these in the past when on Biktarvy ) and she stopped taking it until she could talk with her ID provider Corean Fireman, NP. Specifically, her concern was that these side effects might be an indication that she had developed resistance/mutations.  Her last CD4+ count in our records was 110 on 06/25/2021. She thinks she may have had more recent CD4+ count performed by a provider she refers to as Dr. Morene (she's unsure if Morene is last name or first name) who sees in [place].  PAST MEDICAL HISTORY :   has a past medical history of Clostridium difficile colitis, HCAP (healthcare-associated pneumonia) (03/15/2015), History of blood transfusion (02/03/2007), HIV (human immunodeficiency virus infection) (HCC) (dx'd 2009), Influenza A (H1N1), Intravenous drug user, Neuralgia, post-herpetic, Pneumonia (02/2014; 01/2015), PTSD (post-traumatic stress disorder), and Trichomonas vaginitis.  has a past surgical history that includes Cesarean section (2011; 2013); Tonsillectomy and adenoidectomy (1990s); Appendectomy (1990s); and Peripherally inserted central catheter insertion (Right, 06/2014; 01/2014; 03/15/2015).  SUBJECTIVE:   VITAL SIGNS: Temp:  [98.1 F (36.7 C)-98.7 F (37.1 C)] 98.7 F (37.1 C) (12/17 1145) Pulse Rate:  [79-93] 87 (12/17 1145) Resp:  [16-18] 16 (12/17 1145) BP: (89-104)/(61-77) 104/77 (12/17 1145) SpO2:  [98 %-100 %] 98 % (12/17 1145)  PHYSICAL EXAMINATION: General: malnourished, no acute distress, sitting in bed HEENT MMM, sclera anicteric Cardiovascular:  regular rate and rhythm  Lungs: breathing comfortably on room air, diminished right base  Abdomen: soft, non-tender, non-distended  Musculoskeletal: warm, dry, no edema    Recent Labs  Lab 01/16/24 1453 01/16/24 1529  NA 135 137  K 3.5 3.5  CL 96* 97*  CO2 28  --   BUN 11 10  CREATININE 1.14* 1.10*  GLUCOSE 114* 112*   Recent Labs  Lab 01/16/24 1421 01/16/24 1529  HGB 11.8* 13.6  HCT 37.2 40.0  WBC 9.1  --   PLT 203  --    No results found.   ASSESSMENT / PLAN:  # Multifocal pneumonia # Immunocompromised  Infectious work-up negative thus far. Currently breathing comfortably on room air.  - Continue antimicrobials per ID  - Plan for therapeutic bronch tomorrow  - Follow-up fungitell  # HIV CD4 T Cell Abs 148 on 12/14.  - Resumed home ART therapy  - Dapsone  per ID for PJP prophylaxis   Rexene LOISE Blush, PA-C [facility] 01/19/2024 12:23 PM  Please see [email or web address] for pager details.  From 7A-7P if no response, please call [phone] After hours, please call ELink [phone]

## 2024-01-19 NOTE — Progress Notes (Signed)
 PROGRESS NOTE    KERY HALTIWANGER  FMW:[id number] DOB: 29-Nov-1990 DOA: 01/16/2024 PCP: Melvenia Corean SAILOR, NP   Brief Narrative: Frances Ball is a 33 y.o. female with a history of IV drug use, factor V Leiden without history of VTE, HIV, AIDS, chronic bronchiectasis, anemia, hepatitis C, failure to thrive.  Patient presented secondary to cough and shortness of breath with CT evidence of multifocal pneumonia.  Patient start empirically on antibiotics and infectious ease consulted for assistance in management.  Patient was transition to cefepime  for continued treatment of pneumonia in setting of underlying bronchiectasis.  Patient with steady improvement.  Pulmonology consulted for consideration of bronchoscopy.   Assessment and Plan:  Multifocal pneumonia/bronchiectasis Patient history of recurrent pneumonia.  Associated chest burning and shortness of breath.  CT scanning this admission with evidence of right middle lobe and right lower lobe airspace disease compatible with multifocal pneumonia with additional concern for possible aspiration.  Patient started on vancomycin  and ceftriaxone  and ID consulted for assistance in management.  Patient transition to cefepime .  Patient with steady improvement in symptomology, including improvement in fevers.  Pulmonology consulted with plan for diagnostic bronchoscopy on 12/18. - ID recommendations: Cefepime  - Ambulatory pulse ox prior to discharge - Pulmonology: Bronchoscopy planned for 12/18  Sepsis Present on admission.  Secondary to multifocal pneumonia.  Blood cultures obtained and are no growth to date.  HIV/AIDS Patient with long history of HIV.  Currently meets criteria for AIDS based on CD4 count of 148.  Patient is currently on Biktarvy  for management.  HIV quant testing is pending.  Patient was initially started on atovaquone  for PJP prophylaxis - Follow-up HIV quant test - ID recommendations: Dapsone  for PJP coverage, Biktarvy   Lung  nodules Measuring up to 6 mm with recommendation for outpatient follow-up.  Hepatitis C Status posttreatment for cure. -ID recommendations: HCV quant testing pending  Low-normal blood pressure Stable.  AKI Baseline creatinine of about 0.7-0.8.  Creatinine of 1.14 on admission. - BMP in a.m. if able to obtain labs  Hypocalcemia Mild. Asymptomatic.  Hypomagnesemia Patient given IV supplementation, but has refused repeat labs. - Magnesium  in a.m. if able to obtain labs  Chronic normocytic anemia Unclear etiology. Patient is still having menstrual periods, which could be etiology. Asymptomatic.   DVT prophylaxis: Subcutaneous heparin  Code Status:   Code Status: Full Code Family Communication: Daughter at bedside Disposition Plan: Discharge home likely pending ongoing specialist recommendations and bronchoscopy   Consultants:  Infectious disease PCCM  Procedures:    Antimicrobials:     Subjective: Patient reports feeling better than earlier this admission at rest. Still with dyspnea on exertion, however. Still with cough.  Objective: BP (!) 89/69 (BP Location: Left Arm)   Pulse 91   Temp 98.5 F (36.9 C) (Oral)   Resp 16   Ht 5' 2 (1.575 m)   Wt 45.4 kg   SpO2 98%   BMI 18.29 kg/m   Examination:  General exam: Appears calm and comfortable. Respiratory system: Diminished to auscultation. Respiratory effort normal. Cardiovascular system: S1 & S2 heard, RRR. Gastrointestinal system: Abdomen is nondistended, soft and nontender. Normal bowel sounds heard. Central nervous system: Alert and oriented. No focal neurological deficits. Musculoskeletal: No edema. No calf tenderness Psychiatry: Judgement and insight appear normal. Mood & affect appropriate.    Data Reviewed: I have personally reviewed following labs and imaging studies  CBC Lab Results  Component Value Date   WBC 9.1 01/16/2024   RBC 3.97 01/16/2024  HGB 13.6 01/16/2024   HCT 40.0  01/16/2024   MCV 81.2 01/16/2024   MCH 25.8 (L) 01/16/2024   PLT 203 01/16/2024   MCHC 31.7 01/16/2024   RDW 15.1 01/16/2024   LYMPHSABS 1.1 01/16/2024   MONOABS 0.3 01/16/2024   EOSABS 0.0 01/16/2024   BASOSABS 0.0 01/16/2024     Last metabolic panel Lab Results  Component Value Date   NA 137 01/16/2024   K 3.5 01/16/2024   CL 97 (L) 01/16/2024   CO2 28 01/16/2024   BUN 10 01/16/2024   CREATININE 1.10 (H) 01/16/2024   GLUCOSE 112 (H) 01/16/2024   GFRNONAA >60 01/16/2024   GFRAA 83 03/14/2020   CALCIUM 8.5 (L) 01/16/2024   PHOS 2.7 08/31/2023   PROT 7.9 01/16/2024   ALBUMIN  3.6 01/16/2024   BILITOT 0.5 01/16/2024   ALKPHOS 53 01/16/2024   AST 19 01/16/2024   ALT 7 01/16/2024   ANIONGAP 11 01/16/2024    GFR: Estimated Creatinine Clearance: 51.6 mL/min (A) (by C-G formula based on SCr of 1.1 mg/dL (H)).  Recent Results (from the past 240 hours)  Resp panel by RT-PCR (RSV, Flu A&B, Covid) Anterior Nasal Swab     Status: None   Collection Time: 01/16/24  2:21 PM   Specimen: Anterior Nasal Swab  Result Value Ref Range Status   SARS Coronavirus 2 by RT PCR NEGATIVE NEGATIVE Final   Influenza A by PCR NEGATIVE NEGATIVE Final   Influenza B by PCR NEGATIVE NEGATIVE Final    Comment: (NOTE) The Xpert Xpress SARS-CoV-2/FLU/RSV plus assay is intended as an aid in the diagnosis of influenza from Nasopharyngeal swab specimens and should not be used as a sole basis for treatment. Nasal washings and aspirates are unacceptable for Xpert Xpress SARS-CoV-2/FLU/RSV testing.  Fact Sheet for Patients: bloggercourse.com  Fact Sheet for Healthcare Providers: seriousbroker.it  This test is not yet approved or cleared by the [place]  FDA and has been authorized for detection and/or diagnosis of SARS-CoV-2 by FDA under an Emergency Use Authorization (EUA). This EUA will remain in effect (meaning this test can be used) for the  duration of the COVID-19 declaration under Section 564(b)(1) of the Act, 21 U.S.C. section 360bbb-3(b)(1), unless the authorization is terminated or revoked.     Resp Syncytial Virus by PCR NEGATIVE NEGATIVE Final    Comment: (NOTE) Fact Sheet for Patients: bloggercourse.com  Fact Sheet for Healthcare Providers: seriousbroker.it  This test is not yet approved or cleared by the [place]  FDA and has been authorized for detection and/or diagnosis of SARS-CoV-2 by FDA under an Emergency Use Authorization (EUA). This EUA will remain in effect (meaning this test can be used) for the duration of the COVID-19 declaration under Section 564(b)(1) of the Act, 21 U.S.C. section 360bbb-3(b)(1), unless the authorization is terminated or revoked.  Performed at [facility], [address] [place], [place] [address]   Respiratory (~20 pathogens) panel by PCR     Status: None   Collection Time: 01/16/24  2:21 PM   Specimen: Nasopharyngeal Swab; Respiratory  Result Value Ref Range Status   Adenovirus NOT DETECTED NOT DETECTED Final   Coronavirus 229E NOT DETECTED NOT DETECTED Final    Comment: (NOTE) The Coronavirus on the Respiratory Panel, DOES NOT test for the novel  Coronavirus (2019 nCoV)    Coronavirus HKU1 NOT DETECTED NOT DETECTED Final   Coronavirus NL63 NOT DETECTED NOT DETECTED Final   Coronavirus OC43 NOT DETECTED NOT DETECTED Final   Metapneumovirus  NOT DETECTED NOT DETECTED Final   Rhinovirus / Enterovirus NOT DETECTED NOT DETECTED Final   Influenza A NOT DETECTED NOT DETECTED Final   Influenza B NOT DETECTED NOT DETECTED Final   Parainfluenza Virus 1 NOT DETECTED NOT DETECTED Final   Parainfluenza Virus 2 NOT DETECTED NOT DETECTED Final   Parainfluenza Virus 3 NOT DETECTED NOT DETECTED Final   Parainfluenza Virus 4 NOT DETECTED NOT DETECTED Final   Respiratory Syncytial Virus NOT DETECTED NOT DETECTED Final    Bordetella pertussis NOT DETECTED NOT DETECTED Final   Bordetella Parapertussis NOT DETECTED NOT DETECTED Final   Chlamydophila pneumoniae NOT DETECTED NOT DETECTED Final   Mycoplasma pneumoniae NOT DETECTED NOT DETECTED Final    Comment: Performed at [facility], [address] [place], [place] [address]  Blood culture (routine x 2)     Status: None (Preliminary result)   Collection Time: 01/16/24  3:15 PM   Specimen: BLOOD  Result Value Ref Range Status   Specimen Description BLOOD RIGHT ANTECUBITAL  Final   Special Requests   Final    BOTTLES DRAWN AEROBIC AND ANAEROBIC Blood Culture results may not be optimal due to an inadequate volume of blood received in culture bottles   Culture   Final    NO GROWTH 3 DAYS Performed at [facility], [address] [place], [place] [address]    Report Status PENDING  Incomplete  MRSA Next Gen by PCR, Nasal     Status: None   Collection Time: 01/18/24 12:23 PM  Result Value Ref Range Status   MRSA by PCR Next Gen NOT DETECTED NOT DETECTED Final    Comment: (NOTE) The GeneXpert MRSA Assay (FDA approved for NASAL specimens only), is one component of a comprehensive MRSA colonization surveillance program. It is not intended to diagnose MRSA infection nor to guide or monitor treatment for MRSA infections. Test performance is not FDA approved in patients less than 74 years old. Performed at [facility], [address] [place], [place] [address]       Radiology Studies: No results found.    LOS: 3 days    Elgin Lam, MD [facility] 01/19/2024, 8:20 AM   If 7PM-7AM, please contact night-coverage [email or web address]

## 2024-01-19 NOTE — Plan of Care (Signed)
   Problem: Education: Goal: Knowledge of General Education information will improve Description: Including pain rating scale, medication(s)/side effects and non-pharmacologic comfort measures Outcome: Progressing   Problem: Pain Managment: Goal: General experience of comfort will improve and/or be controlled Outcome: Progressing   Problem: Safety: Goal: Ability to remain free from injury will improve Outcome: Progressing

## 2024-01-19 NOTE — Progress Notes (Addendum)
 [facility]  Date of Admission:  01/16/2024   Total days of inpatient antibiotics 3  Principal Problem:   Multifocal pneumonia Active Problems:   HIV disease (HCC)   Sepsis with acute hypoxic respiratory failure without septic shock [facility])   Immunocompromised          Assessment: 33 year old female with past medical history of HIV/AIDS with nonadherence to ART, history of IVDA, seizures posttreatment, chronic bronchiectasis with hospitalization in July 2025 for MSSA pneumonia presents with dyspnea found to have #Pneumonia, multifocal in the setting of chronic bronchiectasis #Bactrim allergy - Patient states she had burning sensation prior to hospitalization starting the day before.  She had associated shortness of breath that at that time.  She states her breathing was at baseline before these events. - CT showed d right middle lobe airspace, right lower lobe and  minimal left lower lobe air bronchograms compatible with multifocal pneumonia. right lower lobe concern for aspiration.  Multiple new pulmonary nodules up to 6 mm recommend follow-up CT - Patient started on PJP treatment atovaquone  given Bactrim allergy, vancomycin  and ceftriaxone  along with doxycycline  - LDH normal, CT does not seem consistent with PJP pneumonia given consolidative changes.  Patient's symptoms were pretty acute as such I suspect possible mucous plugging versus commune acquired pneumonia.  Will transition atovaquone  to PJP prophylaxis -MRSA PCR negative, vancomycin  stopped. #HIV/AIDS - Patient has not taken her Biktarvy  for the past 2 months due to transportation issues.  Amenable to restarting. - Will restart dapsone  for PJP prophylaxis as above, restart Biktarvy   Recommendations: -Continue cefepime  for now, fever curve improving, patient states she feels better overall, room air - Given changes on CT with dense right lower lobe consolidation agree with bronchoscopy with  BAL and cultures(Therapeutic given right lung changes). Please obtain fungal/AFB/bacterial cultures PCP, cytology. -Continue dapsone  and Biktarvy . - Follow sputum cultures/PJP pcr(not collected), Fungitell - Follow-up HCVRNA - Communicated plan to primary - Droplet precaution given history of MRSA per protocol  Microbiology:   Antibiotics: Cefepime  12/14-present Biktarvy  and dapsone  Vancomycin  12/13-14 Atovaquone  12/14-15 Cultures: Blood  Urine  Other   SUBJECTIVE: [place] feels better overall Interval: Afebrile overnight  Review of Systems: Review of Systems  All other systems reviewed and are negative.    Scheduled Meds:  bictegravir-emtricitabine -tenofovir  AF  1 tablet Oral Daily   dapsone   100 mg Oral Daily   diphenhydrAMINE   25 mg Intravenous Q24H   heparin   5,000 Units Subcutaneous Q12H   potassium chloride   40 mEq Oral Once   Continuous Infusions:  ceFEPime  (MAXIPIME ) IV 2 g (01/19/24 0848)   magnesium  sulfate bolus IVPB     PRN Meds:.acetaminophen  **OR** acetaminophen , albuterol , bisacodyl , guaiFENesin , HYDROcodone -acetaminophen , morphine  injection, ondansetron  **OR** ondansetron  (ZOFRAN ) IV, polyethylene glycol, sodium chloride  HYPERTONIC Allergies[1]  OBJECTIVE: Vitals:   01/19/24 0012 01/19/24 0421 01/19/24 0750 01/19/24 1145  BP: 91/66 94/67 (!) 89/69 104/77  Pulse: 79 86 91 87  Resp:   16 16  Temp: 98.6 F (37 C) 98.1 F (36.7 C) 98.5 F (36.9 C) 98.7 F (37.1 C)  TempSrc: Oral Oral Oral Oral  SpO2: 100% 99% 98% 98%  Weight:      Height:       Body mass index is 18.29 kg/m.  Physical Exam Constitutional:      Appearance: Normal appearance.  HENT:     Head: Normocephalic and atraumatic.     Right Ear: Tympanic membrane normal.  Left Ear: Tympanic membrane normal.     Nose: Nose normal.     Mouth/Throat:     Mouth: Mucous membranes are moist.  Eyes:     Extraocular Movements: Extraocular movements intact.     Conjunctiva/sclera:  Conjunctivae normal.     Pupils: Pupils are equal, round, and reactive to light.  Cardiovascular:     Rate and Rhythm: Normal rate and regular rhythm.     Heart sounds: No murmur heard.    No friction rub. No gallop.  Pulmonary:     Effort: Pulmonary effort is normal.     Breath sounds: Normal breath sounds.  Abdominal:     General: Abdomen is flat.     Palpations: Abdomen is soft.  Musculoskeletal:        General: Normal range of motion.  Skin:    General: Skin is warm and dry.  Neurological:     General: No focal deficit present.     Mental Status: She is alert and oriented to person, place, and time.  Psychiatric:        Mood and Affect: Mood normal.       Lab Results Lab Results  Component Value Date   WBC 9.1 01/16/2024   HGB 13.6 01/16/2024   HCT 40.0 01/16/2024   MCV 81.2 01/16/2024   PLT 203 01/16/2024    Lab Results  Component Value Date   CREATININE 1.10 (H) 01/16/2024   BUN 10 01/16/2024   NA 137 01/16/2024   K 3.5 01/16/2024   CL 97 (L) 01/16/2024   CO2 28 01/16/2024    Lab Results  Component Value Date   ALT 7 01/16/2024   AST 19 01/16/2024   GGT 12 10/07/2017   ALKPHOS 53 01/16/2024   BILITOT 0.5 01/16/2024        Loney Stank, MD [facility] 01/19/2024, 3:16 PM      [1]  Allergies Allergen Reactions   Levaquin [Levofloxacin In D5w] Shortness Of Breath and Rash   Sulfa Antibiotics Swelling    Facial swelling and burning   Vancomycin  Itching, Swelling and Other (See Comments)    Face swelled turned red with burning and itching   Zithromax [Azithromycin] Shortness Of Breath, Swelling and Other (See Comments)    Pt reports tightness in skin with redness    Bactrim [Sulfamethoxazole-Trimethoprim] Swelling and Other (See Comments)    Pt reports that she was placed on Bactrim for pneumonia when she was d/c from [facility] and developed a red face with swelling and can not  tolerate Bactrim.    Darunavir  Rash

## 2024-01-20 ENCOUNTER — Inpatient Hospital Stay (HOSPITAL_COMMUNITY): Admitting: Anesthesiology

## 2024-01-20 ENCOUNTER — Encounter (HOSPITAL_COMMUNITY): Admission: EM | Payer: Self-pay | Source: Home / Self Care | Attending: Hospitalist

## 2024-01-20 ENCOUNTER — Other Ambulatory Visit (HOSPITAL_COMMUNITY): Payer: Self-pay

## 2024-01-20 ENCOUNTER — Encounter (HOSPITAL_COMMUNITY): Payer: Self-pay | Admitting: Internal Medicine

## 2024-01-20 DIAGNOSIS — G43909 Migraine, unspecified, not intractable, without status migrainosus: Secondary | ICD-10-CM | POA: Diagnosis not present

## 2024-01-20 DIAGNOSIS — N179 Acute kidney failure, unspecified: Secondary | ICD-10-CM | POA: Diagnosis not present

## 2024-01-20 DIAGNOSIS — F419 Anxiety disorder, unspecified: Secondary | ICD-10-CM | POA: Diagnosis not present

## 2024-01-20 DIAGNOSIS — J189 Pneumonia, unspecified organism: Secondary | ICD-10-CM | POA: Diagnosis not present

## 2024-01-20 DIAGNOSIS — D649 Anemia, unspecified: Secondary | ICD-10-CM | POA: Diagnosis not present

## 2024-01-20 DIAGNOSIS — B20 Human immunodeficiency virus [HIV] disease: Secondary | ICD-10-CM | POA: Diagnosis not present

## 2024-01-20 DIAGNOSIS — J188 Other pneumonia, unspecified organism: Secondary | ICD-10-CM | POA: Diagnosis not present

## 2024-01-20 HISTORY — PX: FLEXIBLE BRONCHOSCOPY: SHX5094

## 2024-01-20 HISTORY — PX: BRONCHIAL WASHINGS: SHX5105

## 2024-01-20 LAB — FUNGITELL BETA-D-GLUCAN: Fungitell Value:: <31.25 pg/mL

## 2024-01-20 LAB — BODY FLUID CELL COUNT WITH DIFFERENTIAL
Eos, Fluid: 0 %
Lymphs, Fluid: 5 %
Monocyte-Macrophage-Serous Fluid: 3 % — ABNORMAL LOW (ref 50–90)
Neutrophil Count, Fluid: 92 % — ABNORMAL HIGH (ref 0–25)
Total Nucleated Cell Count, Fluid: 1650 uL — ABNORMAL HIGH (ref 0–1000)

## 2024-01-20 SURGERY — BRONCHOSCOPY, FLEXIBLE
Anesthesia: General | Laterality: Right

## 2024-01-20 MED ORDER — VASOPRESSIN 20 UNIT/ML IV SOLN
INTRAVENOUS | Status: AC
  Filled 2024-01-20: qty 1

## 2024-01-20 MED ORDER — PHENYLEPHRINE HCL-NACL 20-0.9 MG/250ML-% IV SOLN
INTRAVENOUS | Status: DC | PRN
  Administered 2024-01-20: 08:00:00 80 ug via INTRAVENOUS

## 2024-01-20 MED ORDER — PROPOFOL 10 MG/ML IV BOLUS
INTRAVENOUS | Status: DC | PRN
  Administered 2024-01-20 (×2): 30 mg via INTRAVENOUS
  Administered 2024-01-20: 08:00:00 80 mg via INTRAVENOUS

## 2024-01-20 MED ORDER — DAPSONE 100 MG PO TABS
100.0000 mg | ORAL_TABLET | Freq: Every day | ORAL | 0 refills | Status: AC
  Filled 2024-01-20: qty 30, 30d supply, fill #0

## 2024-01-20 MED ORDER — ONDANSETRON HCL 4 MG/2ML IJ SOLN
INTRAMUSCULAR | Status: DC | PRN
  Administered 2024-01-20: 08:00:00 4 mg via INTRAVENOUS

## 2024-01-20 MED ORDER — LIDOCAINE 2% (20 MG/ML) 5 ML SYRINGE
INTRAMUSCULAR | Status: DC | PRN
  Administered 2024-01-20: 08:00:00 40 mg via INTRAVENOUS

## 2024-01-20 MED ORDER — PROPOFOL 500 MG/50ML IV EMUL
INTRAVENOUS | Status: AC
  Filled 2024-01-20: qty 300

## 2024-01-20 MED ORDER — MIDAZOLAM HCL 2 MG/2ML IJ SOLN
INTRAMUSCULAR | Status: AC
  Filled 2024-01-20: qty 2

## 2024-01-20 MED ORDER — ROCURONIUM BROMIDE 10 MG/ML (PF) SYRINGE
PREFILLED_SYRINGE | INTRAVENOUS | Status: DC | PRN
  Administered 2024-01-20: 08:00:00 20 mg via INTRAVENOUS

## 2024-01-20 MED ORDER — SUGAMMADEX SODIUM 200 MG/2ML IV SOLN
INTRAVENOUS | Status: DC | PRN
  Administered 2024-01-20: 08:00:00 200 mg via INTRAVENOUS

## 2024-01-20 MED ORDER — SODIUM CHLORIDE 0.9 % IV SOLN: INTRAVENOUS | Status: DC | PRN

## 2024-01-20 MED ORDER — MIDAZOLAM HCL (PF) 2 MG/2ML IJ SOLN
INTRAMUSCULAR | Status: DC | PRN
  Administered 2024-01-20: 07:00:00 2 mg via INTRAVENOUS

## 2024-01-20 NOTE — Anesthesia Postprocedure Evaluation (Signed)
 Anesthesia Post Note  Patient: Frances Ball  Procedure(s) Performed: BRONCHOSCOPY, FLEXIBLE (Right) IRRIGATION, BRONCHUS     Patient location during evaluation: Endoscopy Anesthesia Type: General Level of consciousness: awake and alert Pain management: pain level controlled Vital Signs Assessment: post-procedure vital signs reviewed and stable Respiratory status: spontaneous breathing, nonlabored ventilation, respiratory function stable and patient connected to nasal cannula oxygen Cardiovascular status: blood pressure returned to baseline and stable Postop Assessment: no apparent nausea or vomiting Anesthetic complications: no   No notable events documented.  Last Vitals:  Vitals:   01/20/24 0813 01/20/24 0820  BP: 93/63 100/79  Pulse: 97 100  Resp: 17 15  Temp:    SpO2: 94% 95%    Last Pain:  Vitals:   01/20/24 0820  TempSrc:   PainSc: 0-No pain                 Rome Ade

## 2024-01-20 NOTE — Progress Notes (Signed)
 SATURATION QUALIFICATIONS: (This note is used to comply with regulatory documentation for home oxygen)  Patient Saturations on Room Air at Rest = 99%  Patient Saturations on Room Air while Ambulating = 97%  Please briefly explain why patient needs home oxygen: No need for home oxygen at this time.

## 2024-01-20 NOTE — Progress Notes (Signed)
 Occupational Therapy Treatment/Discharge Patient Details Name: Frances Ball MRN: [id number] DOB: 08-28-1990 Today's Date: 01/20/2024   History of present illness Pt is 32 y/o Female admotted 01/16/24 with cough congestion and fever, FTT, hypoxia. Pt with multifocal PNA. PMH: IV drug use hx, VTE, factor V Leiden, HIV AIDS, chronic bronchiectasis, anemia, hep C, hospitalized 08/2023, PTSD   OT comments  Pt in bed upon therapy arrival and agreeable to participate in OT treatment session. Pt reports no difficulty with completing daily tasks. Able to complete all ADL's independently with no AD. Walking pulmonary saturation performed with pt's SpO2 remaining in upper 90's on RA seated at rest and when walking hallways. At this time, pt has met all therapy goals and no acute OT needs identified. Will sign off.       If plan is discharge home, recommend the following:  Assist for transportation   Equipment Recommendations  None recommended by OT       Precautions / Restrictions Precautions Precautions: None Recall of Precautions/Restrictions: Intact Precaution/Restrictions Comments: immunocompromised Restrictions Weight Bearing Restrictions Per Provider Order: No       Mobility Bed Mobility Overal bed mobility: Independent   Transfers Overall transfer level: Independent Equipment used: None        Balance Overall balance assessment: Independent       ADL either performed or assessed with clinical judgement   ADL Overall ADL's : Independent;At baseline               Communication Communication Communication: No apparent difficulties   Cognition Arousal: Alert Behavior During Therapy: Flat affect Cognition: No apparent impairments     Following commands: Intact        Cueing   Cueing Techniques: Verbal cues        General Comments VSS on RA. SpO2 99% on RA, Pt walked up/down hall and around half circle hallway with SpO2 remaining at 97% on RA.    Pertinent  Vitals/ Pain       Pain Assessment Pain Assessment: No/denies pain Faces Pain Scale: No hurt         Frequency  Min 2X/week        Progress Toward Goals  OT Goals(current goals can now be found in the care plan section)  Progress towards OT goals: Goals met/education completed, patient discharged from OT      AM-PAC OT 6 Clicks Daily Activity     Outcome Measure   Help from another person eating meals?: None Help from another person taking care of personal grooming?: None Help from another person toileting, which includes using toliet, bedpan, or urinal?: None Help from another person bathing (including washing, rinsing, drying)?: None Help from another person to put on and taking off regular upper body clothing?: None Help from another person to put on and taking off regular lower body clothing?: None 6 Click Score: 24    End of Session    OT Visit Diagnosis: Unsteadiness on feet (R26.81);Muscle weakness (generalized) (M62.81)   Activity Tolerance Patient tolerated treatment well   Patient Left in bed;with call bell/phone within reach           Time: 1547-1600 OT Time Calculation (min): 13 min  Charges: OT General Charges $OT Visit: 1 Visit OT Treatments $Therapeutic Activity: 8-22 mins  Leita Howell, OTR/L,CBIS  Supplemental OT - [facility] and [facility] Secure Chat Preferred    Albena Comes, Leita BIRCH 01/20/2024, 4:19 PM

## 2024-01-20 NOTE — Progress Notes (Addendum)
 01/20/2024  Seen in f/u for abnormal imaging  S: Ongoing DOE but improved Lingering cough as well  O:    01/20/2024    8:20 AM 01/20/2024    8:13 AM 01/20/2024    8:10 AM  Vitals with BMI  Systolic 100 93 85  Diastolic 79 63 60  Pulse 100 97 96   No distress Some rhonci on R Comfortable on RA  A: Suspect improving CAP in immunocompromised Bronch to r/o opportunistic pathogens and endobronchial lesion  P: Bronch today ID will f/u results Would do walking pulse ox on RA Fine for DC from my standpoint w/ antimicrobials per ID   Rolan Sharps MD Pulmonary Critical Care Medicine Securechat if during day (7a-7p) [phone] if after hours (7p-7a)

## 2024-01-20 NOTE — Interval H&P Note (Signed)
 01/20/2024 No changes Plan for bronch today. See my separate progress note for additional plans  Rolan Sharps MD PCCM

## 2024-01-20 NOTE — Plan of Care (Signed)

## 2024-01-20 NOTE — Op Note (Signed)
 Bronchoscopy Procedure Note  Frances Ball  [id number]  1990-11-16  Date:01/20/2024  Time:8:12 AM   Provider Performing:Shanan Mcmiller C Claudene   Procedure(s):  Flexible bronchoscopy with bronchial alveolar lavage [id number])  Indication(s) Immunocompromised PNA  Consent Risks of the procedure as well as the alternatives and risks of each were explained to the patient and/or caregiver.  Consent for the procedure was obtained and is signed in the bedside chart  Anesthesia general   Time Out Verified patient identification, verified procedure, site/side was marked, verified correct patient position, special equipment/implants available, medications/allergies/relevant history reviewed, required imaging and test results available.   Sterile Technique Usual hand hygiene, masks, gowns, and gloves were used   Procedure Description Bronchoscope advanced through endotracheal tube and into airway.  Airways were examined down to subsegmental level with findings noted below.   Following diagnostic evaluation, BAL(s) performed in RLL with normal saline and return of mostly clear fluid  Findings:  - Some residual thick mucus RLL whitish nonobstructing - Remainder of lobes clear - No endobronchial lesions  Complications/Tolerance None; patient tolerated the procedure well. Chest X-ray is not needed post procedure.   EBL Minimal   Specimen(s) RLL BAL (see orders)

## 2024-01-20 NOTE — Progress Notes (Signed)
Pt refused blood work this morning.

## 2024-01-20 NOTE — Transfer of Care (Signed)
 Immediate Anesthesia Transfer of Care Note  Patient: Frances Ball  Procedure(s) Performed: BRONCHOSCOPY, FLEXIBLE (Right) IRRIGATION, BRONCHUS  Patient Location: PACU and Endoscopy Unit  Anesthesia Type:General  Level of Consciousness: sedated  Airway & Oxygen Therapy: Patient Spontanous Breathing  Post-op Assessment: Report given to RN  Post vital signs: Reviewed and stable  Last Vitals:  Vitals Value Taken Time  BP 89/57 01/20/24 08:00  Temp 36.6 C 01/20/24 07:59  Pulse 99 01/20/24 08:01  Resp 17 01/20/24 08:01  SpO2 94 % 01/20/24 08:01  Vitals shown include unfiled device data.  Last Pain:  Vitals:   01/20/24 0759  TempSrc: Temporal  PainSc: Asleep         Complications: No notable events documented.

## 2024-01-20 NOTE — Anesthesia Procedure Notes (Signed)
 Procedure Name: Intubation Date/Time: 01/20/2024 7:34 AM  Performed by: Delores Duwaine SAUNDERS, CRNAPre-anesthesia Checklist: Patient identified, Emergency Drugs available, Suction available and Patient being monitored Patient Re-evaluated:Patient Re-evaluated prior to induction Oxygen Delivery Method: Circle System Utilized Preoxygenation: Pre-oxygenation with 100% oxygen Induction Type: IV induction Ventilation: Mask ventilation without difficulty Laryngoscope Size: Mac and 3 Grade View: Grade I Tube type: Oral Tube size: 8.0 mm Number of attempts: 1 Airway Equipment and Method: Stylet and Oral airway Placement Confirmation: ETT inserted through vocal cords under direct vision, positive ETCO2 and breath sounds checked- equal and bilateral Secured at: 21 cm Tube secured with: Tape Dental Injury: Teeth and Oropharynx as per pre-operative assessment
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
## 2024-01-20 NOTE — Progress Notes (Addendum)
 PROGRESS NOTE    Frances Ball  FMW:[id number] DOB: 1991-01-12 DOA: 01/16/2024 PCP: Melvenia Corean SAILOR, NP   Brief Narrative: Frances Ball is a 33 y.o. female with a history of IV drug use, factor V Leiden without history of VTE, HIV, AIDS, chronic bronchiectasis, anemia, hepatitis C, failure to thrive.  Patient presented secondary to cough and shortness of breath with CT evidence of multifocal pneumonia.  Patient start empirically on antibiotics and infectious ease consulted for assistance in management.  Patient was transition to cefepime  for continued treatment of pneumonia in setting of underlying bronchiectasis.  Patient with steady improvement.  Pulmonology consulted and performed bronchoscopy on 12/18.   Assessment and Plan:  Multifocal pneumonia/bronchiectasis Patient history of recurrent pneumonia.  Associated chest burning and shortness of breath.  CT scanning this admission with evidence of right middle lobe and right lower lobe airspace disease compatible with multifocal pneumonia with additional concern for possible aspiration.  Patient started on vancomycin  and ceftriaxone  and ID consulted for assistance in management.  Patient transition to cefepime .  Patient with steady improvement in symptomology, including improvement in fevers.  Pulmonology consulted performed bronchoscopy with BAL on 12/18. - ID recommendations: Cefepime ; update recommendations pending today - Ambulatory pulse ox prior to discharge - Pulmonology recommendations: no further inpatient pulmonology management  Sepsis Present on admission.  Secondary to multifocal pneumonia.  Blood cultures obtained and are no growth to date.  HIV/AIDS Patient with long history of HIV.  Currently meets criteria for AIDS based on CD4 count of 148.  Patient is currently on Biktarvy  for management.  HIV quant testing is pending.  Patient was initially started on atovaquone  for PJP prophylaxis - Follow-up HIV quant test -  ID recommendations: Dapsone  for PJP coverage, Biktarvy   Lung nodules Measuring up to 6 mm with recommendation for outpatient follow-up.  Hepatitis C Status posttreatment for cure. -ID recommendations: HCV quant testing pending  Low-normal blood pressure Stable.  AKI Baseline creatinine of about 0.7-0.8.  Creatinine of 1.14 on admission. Unable to get get repeat labs.  Hypocalcemia Mild. Asymptomatic.  Hypomagnesemia Patient given IV supplementation, but has refused repeat labs. Unable to get repeat labs.  Chronic normocytic anemia Unclear etiology. Patient is still having menstrual periods, which could be etiology. Asymptomatic.   DVT prophylaxis: Subcutaneous heparin  Code Status:   Code Status: Full Code Family Communication: Daughter at bedside Disposition Plan: Discharge home likely pending ongoing ID recommendations/transition to oral antibiotics as needed.   Consultants:  Infectious disease PCCM  Procedures:  Bronchoscopy/BAL  Antimicrobials: Vancomycin  Doxycycline  Dapsone  Ceftriaxone  Cefepime  Biktarvy  Atovaquone    Subjective: Patient reports feeling alright. Some continued coughing with sputum production.  Objective: BP 100/71 (BP Location: Right Arm)   Pulse 71   Temp 97.8 F (36.6 C) (Oral)   Resp 18   Ht 5' 2 (1.575 m)   Wt 45.4 kg   SpO2 100%   BMI 18.29 kg/m   Examination:  General exam: Appears calm and comfortable. Respiratory system: Clear. Respiratory effort normal. Cardiovascular system: S1 & S2 heard, RRR. Gastrointestinal system: Abdomen is nondistended, soft and nontender. Normal bowel sounds heard. Central nervous system: Alert and oriented. No focal neurological deficits. Psychiatry: Judgement and insight appear normal. Mood & affect appropriate.   Data Reviewed: I have personally reviewed following labs and imaging studies  CBC Lab Results  Component Value Date   WBC 9.1 01/16/2024   RBC 3.97 01/16/2024   HGB 13.6  01/16/2024   HCT 40.0 01/16/2024  MCV 81.2 01/16/2024   MCH 25.8 (L) 01/16/2024   PLT 203 01/16/2024   MCHC 31.7 01/16/2024   RDW 15.1 01/16/2024   LYMPHSABS 1.1 01/16/2024   MONOABS 0.3 01/16/2024   EOSABS 0.0 01/16/2024   BASOSABS 0.0 01/16/2024     Last metabolic panel Lab Results  Component Value Date   NA 137 01/16/2024   K 3.5 01/16/2024   CL 97 (L) 01/16/2024   CO2 28 01/16/2024   BUN 10 01/16/2024   CREATININE 1.10 (H) 01/16/2024   GLUCOSE 112 (H) 01/16/2024   GFRNONAA >60 01/16/2024   GFRAA 83 03/14/2020   CALCIUM 8.5 (L) 01/16/2024   PHOS 2.7 08/31/2023   PROT 7.9 01/16/2024   ALBUMIN  3.6 01/16/2024   BILITOT 0.5 01/16/2024   ALKPHOS 53 01/16/2024   AST 19 01/16/2024   ALT 7 01/16/2024   ANIONGAP 11 01/16/2024    GFR: Estimated Creatinine Clearance: 51.6 mL/min (A) (by C-G formula based on SCr of 1.1 mg/dL (H)).  Recent Results (from the past 240 hours)  Resp panel by RT-PCR (RSV, Flu A&B, Covid) Anterior Nasal Swab     Status: None   Collection Time: 01/16/24  2:21 PM   Specimen: Anterior Nasal Swab  Result Value Ref Range Status   SARS Coronavirus 2 by RT PCR NEGATIVE NEGATIVE Final   Influenza A by PCR NEGATIVE NEGATIVE Final   Influenza B by PCR NEGATIVE NEGATIVE Final    Comment: (NOTE) The Xpert Xpress SARS-CoV-2/FLU/RSV plus assay is intended as an aid in the diagnosis of influenza from Nasopharyngeal swab specimens and should not be used as a sole basis for treatment. Nasal washings and aspirates are unacceptable for Xpert Xpress SARS-CoV-2/FLU/RSV testing.  Fact Sheet for Patients: bloggercourse.com  Fact Sheet for Healthcare Providers: seriousbroker.it  This test is not yet approved or cleared by the [place]  FDA and has been authorized for detection and/or diagnosis of SARS-CoV-2 by FDA under an Emergency Use Authorization (EUA). This EUA will remain in effect (meaning this test  can be used) for the duration of the COVID-19 declaration under Section 564(b)(1) of the Act, 21 U.S.C. section 360bbb-3(b)(1), unless the authorization is terminated or revoked.     Resp Syncytial Virus by PCR NEGATIVE NEGATIVE Final    Comment: (NOTE) Fact Sheet for Patients: bloggercourse.com  Fact Sheet for Healthcare Providers: seriousbroker.it  This test is not yet approved or cleared by the [place]  FDA and has been authorized for detection and/or diagnosis of SARS-CoV-2 by FDA under an Emergency Use Authorization (EUA). This EUA will remain in effect (meaning this test can be used) for the duration of the COVID-19 declaration under Section 564(b)(1) of the Act, 21 U.S.C. section 360bbb-3(b)(1), unless the authorization is terminated or revoked.  Performed at [facility], [address], [place], [place] [address]   Respiratory (~20 pathogens) panel by PCR     Status: None   Collection Time: 01/16/24  2:21 PM   Specimen: Nasopharyngeal Swab; Respiratory  Result Value Ref Range Status   Adenovirus NOT DETECTED NOT DETECTED Final   Coronavirus 229E NOT DETECTED NOT DETECTED Final    Comment: (NOTE) The Coronavirus on the Respiratory Panel, DOES NOT test for the novel  Coronavirus (2019 nCoV)    Coronavirus HKU1 NOT DETECTED NOT DETECTED Final   Coronavirus NL63 NOT DETECTED NOT DETECTED Final   Coronavirus OC43 NOT DETECTED NOT DETECTED Final   Metapneumovirus NOT DETECTED NOT DETECTED Final   Rhinovirus / Enterovirus NOT  DETECTED NOT DETECTED Final   Influenza A NOT DETECTED NOT DETECTED Final   Influenza B NOT DETECTED NOT DETECTED Final   Parainfluenza Virus 1 NOT DETECTED NOT DETECTED Final   Parainfluenza Virus 2 NOT DETECTED NOT DETECTED Final   Parainfluenza Virus 3 NOT DETECTED NOT DETECTED Final   Parainfluenza Virus 4 NOT DETECTED NOT DETECTED Final   Respiratory Syncytial Virus NOT DETECTED NOT  DETECTED Final   Bordetella pertussis NOT DETECTED NOT DETECTED Final   Bordetella Parapertussis NOT DETECTED NOT DETECTED Final   Chlamydophila pneumoniae NOT DETECTED NOT DETECTED Final   Mycoplasma pneumoniae NOT DETECTED NOT DETECTED Final    Comment: Performed at [facility], [address], [place], [place] [address]  Blood culture (routine x 2)     Status: None (Preliminary result)   Collection Time: 01/16/24  3:15 PM   Specimen: BLOOD  Result Value Ref Range Status   Specimen Description BLOOD RIGHT ANTECUBITAL  Final   Special Requests   Final    BOTTLES DRAWN AEROBIC AND ANAEROBIC Blood Culture results may not be optimal due to an inadequate volume of blood received in culture bottles   Culture   Final    NO GROWTH 4 DAYS Performed at [facility], [address] [place], [place] [address]    Report Status PENDING  Incomplete  MRSA Next Gen by PCR, Nasal     Status: None   Collection Time: 01/18/24 12:23 PM  Result Value Ref Range Status   MRSA by PCR Next Gen NOT DETECTED NOT DETECTED Final    Comment: (NOTE) The GeneXpert MRSA Assay (FDA approved for NASAL specimens only), is one component of a comprehensive MRSA colonization surveillance program. It is not intended to diagnose MRSA infection nor to guide or monitor treatment for MRSA infections. Test performance is not FDA approved in patients less than 15 years old. Performed at [facility], [address] [place], [place] [address]       Radiology Studies: No results found.    LOS: 4 days    Elgin Lam, MD [facility] 01/20/2024, 12:59 PM   If 7PM-7AM, please contact night-coverage [email or web address]

## 2024-01-21 ENCOUNTER — Other Ambulatory Visit (HOSPITAL_COMMUNITY): Payer: Self-pay

## 2024-01-21 DIAGNOSIS — Z882 Allergy status to sulfonamides status: Secondary | ICD-10-CM | POA: Diagnosis not present

## 2024-01-21 DIAGNOSIS — J188 Other pneumonia, unspecified organism: Secondary | ICD-10-CM | POA: Diagnosis not present

## 2024-01-21 DIAGNOSIS — J479 Bronchiectasis, uncomplicated: Secondary | ICD-10-CM | POA: Diagnosis not present

## 2024-01-21 DIAGNOSIS — B20 Human immunodeficiency virus [HIV] disease: Secondary | ICD-10-CM | POA: Diagnosis not present

## 2024-01-21 LAB — CULTURE, BLOOD (ROUTINE X 2): Culture: NO GROWTH

## 2024-01-21 LAB — PNEUMOCYSTIS SMEAR, DFA: Pneumocystis Smear, DFA: NEGATIVE

## 2024-01-21 MED ORDER — CEFADROXIL 500 MG PO CAPS
1000.0000 mg | ORAL_CAPSULE | Freq: Two times a day (BID) | ORAL | Status: DC
  Administered 2024-01-21: 1000 mg via ORAL
  Filled 2024-01-21 (×2): qty 2

## 2024-01-21 MED ORDER — CEFADROXIL 500 MG PO CAPS
1000.0000 mg | ORAL_CAPSULE | Freq: Two times a day (BID) | ORAL | 0 refills | Status: AC
  Filled 2024-01-21: qty 8, 2d supply, fill #0

## 2024-01-21 NOTE — Discharge Instructions (Signed)
 Frances Ball,  You were in the hospital with pneumonia, complicated by your diagnosis of bronchiectasis. You have improved with treatment. Please continue your treatment. While you were here, you also had a bronchoscopy performed. The infectious disease physicians will follow-up on the results.

## 2024-01-21 NOTE — Discharge Summary (Signed)
 " Physician Discharge Summary   Patient: Frances Ball MRN: [id number] DOB: 1990-12-20  Admit date:     01/16/2024  Discharge date: 01/21/2024  Discharge Physician: Elgin Lam   PCP: Melvenia Corean SAILOR, NP   Recommendations at discharge:  ID visit for hospital follow-up Follow-up HCV and HIV quant Follow-up BAL culture, AFB, Fungal culture and pneumocystis smear  Discharge Diagnoses: Principal Problem:   Multifocal pneumonia Active Problems:   HIV disease (HCC)   Sepsis with acute hypoxic respiratory failure without septic shock (HCC)   Immunocompromised  Resolved Problems:   * No resolved hospital problems. *  Hospital Course: Frances Ball is a 33 y.o. female with a history of IV drug use, factor V Leiden without history of VTE, HIV, AIDS, chronic bronchiectasis, anemia, hepatitis C, failure to thrive.  Patient presented secondary to cough and shortness of breath with CT evidence of multifocal pneumonia.  Patient start empirically on antibiotics and infectious ease consulted for assistance in management.  Patient was transition to cefepime  for continued treatment of pneumonia in setting of underlying bronchiectasis.  Patient with steady improvement.  Pulmonology consulted and performed bronchoscopy on 12/18.  Assessment and Plan:  Multifocal pneumonia/bronchiectasis Patient history of recurrent pneumonia.  Associated chest burning and shortness of breath.  CT scanning this admission with evidence of right middle lobe and right lower lobe airspace disease compatible with multifocal pneumonia with additional concern for possible aspiration.  Patient started on vancomycin  and ceftriaxone  and ID consulted for assistance in management.  Patient transition to cefepime .  Patient with steady improvement in symptomology, including improvement in fevers.  Pulmonology consulted performed bronchoscopy with BAL on 12/18. BAL culture, AFB, Fungal culture and pneumocystis smear pending on  discharge. Pulmonology recommendations to discharge on cefadroxil  to complete antibiotic treatment.   Sepsis Present on admission.  Secondary to multifocal pneumonia.  Blood cultures obtained and are no growth to date.   HIV/AIDS Patient with long history of HIV.  Currently meets criteria for AIDS based on CD4 count of 148.  Patient is currently on Biktarvy  for management.  HIV quant testing is pending.  Patient was initially started on atovaquone  for PJP prophylaxis which was transitioned to Dapsone . Continue Biktarvy  and Dapsone  on discharge.   Lung nodules Measuring up to 6 mm with recommendation for outpatient follow-up.   Hepatitis C Status posttreatment for cure. HCV quant testing pending.   Low-normal blood pressure Stable.   AKI Baseline creatinine of about 0.7-0.8.  Creatinine of 1.14 on admission. Unable to get get repeat labs.   Hypocalcemia Mild. Asymptomatic.   Hypomagnesemia Patient given IV supplementation, but has refused repeat labs. Unable to get repeat labs.   Chronic normocytic anemia Unclear etiology. Patient is still having menstrual periods, which could be etiology. Asymptomatic.   Consultants:  Infectious disease PCCM   Procedures:  Bronchoscopy/BAL  Disposition: Home Diet recommendation: Regular diet   DISCHARGE MEDICATION: Allergies as of 01/21/2024       Reactions   Levaquin [levofloxacin In D5w] Shortness Of Breath, Rash   Sulfa Antibiotics Swelling   Facial swelling and burning   Vancomycin  Itching, Swelling, Other (See Comments)   Face swelled turned red with burning and itching   Zithromax [azithromycin] Shortness Of Breath, Swelling, Other (See Comments)   Pt reports tightness in skin with redness   Bactrim [sulfamethoxazole-trimethoprim] Swelling, Other (See Comments)   Pt reports that she was placed on Bactrim for pneumonia when she was d/c from [facility] and developed a red  face with swelling and can not tolerate Bactrim.     Darunavir  Rash        Medication List     TAKE these medications    albuterol  108 (90 Base) MCG/ACT inhaler Commonly known as: VENTOLIN  HFA Inhale 2 puffs into the lungs every 6 (six) hours as needed for wheezing or shortness of breath.   aspirin -acetaminophen -caffeine  250-250-65 MG tablet Commonly known as: EXCEDRIN  MIGRAINE Take 2 tablets by mouth every 6 (six) hours as needed for headache (headache, pain).   Biktarvy  50-200-25 MG Tabs tablet Generic drug: bictegravir-emtricitabine -tenofovir  AF Take 1 tablet by mouth daily.   buprenorphine  8 MG Subl SL tablet Commonly known as: SUBUTEX  Place 8 mg under the tongue daily as needed (Pain management).   cefadroxil  500 MG capsule Commonly known as: DURICEF Take 2 capsules (1,000 mg total) by mouth 2 (two) times daily for 2 days.   dapsone  100 MG tablet Take 1 tablet (100 mg total) by mouth daily.   ondansetron  4 MG tablet Commonly known as: Zofran  Take 1-2 tablets 4 mg by mouth every 8 (eight) hours as needed for nausea or vomiting.        Follow-up Information     [facility] Follow up.   Why: Call 2-3 days in advance to schedule transportation for appointments Contact information: Medicaid transportation services [phone]        Melvenia Corean SAILOR, NP. Schedule an appointment as soon as possible for a visit in 1 week(s).   Specialty: Infectious Diseases Why: For hospital follow-up Contact information: [address] [place] [address] [phone]         Dennise Kingsley, MD. Go on 02/09/2024.   Specialty: Infectious Diseases Why: 2:30 PM Contact information: [address] [place] [address] [phone]                Discharge Exam: BP 109/73 (BP Location: Right Arm)   Pulse 64   Temp 98.1 F (36.7 C) (Oral)   Resp 20   Ht 5' 2 (1.575 m)   Wt 45.4 kg   SpO2 98%   BMI 18.29 kg/m   General exam: Appears calm and comfortable. Respiratory system: Mild right  sided rales. Respiratory effort normal. Cardiovascular system: S1 & S2 heard, RRR. Gastrointestinal system: Abdomen is nondistended, soft and nontender. Normal bowel sounds heard. Central nervous system: Alert and oriented. No focal neurological deficits. Musculoskeletal: No edema. No calf tenderness Psychiatry: Judgement and insight appear normal. Mood & affect appropriate.   Condition at discharge: stable  The results of significant diagnostics from this hospitalization (including imaging, microbiology, ancillary and laboratory) are listed below for reference.   Imaging Studies: CT Angio Chest PE W and/or Wo Contrast Result Date: 01/16/2024 EXAM: CTA of the Chest with contrast for PE 01/16/2024 06:41:47 PM TECHNIQUE: CTA of the chest was performed after the administration of 75 mL of iohexol  (OMNIPAQUE ) 350 MG/ML injection. Multiplanar reformatted images are provided for review. MIP images are provided for review. Automated exposure control, iterative reconstruction, and/or weight based adjustment of the mA/kV was utilized to reduce the radiation dose to as low as reasonably achievable. COMPARISON: CT chest 08/30/2023. CLINICAL HISTORY: rule out PE FINDINGS: PULMONARY ARTERIES: Pulmonary arteries are adequately opacified for evaluation. No pulmonary embolism. Main pulmonary artery is normal in caliber. MEDIASTINUM: The heart and pericardium demonstrate no acute abnormality. There is no acute abnormality of the thoracic aorta. LYMPH NODES: Enlarged right hilar lymph nodes measuring up to 13 mm. LUNGS  AND PLEURA: Airspace consolidation in the right middle lobe, right lower lobe and minimally in the left lower lobe with air bronchograms. Additional tree-in-bud opacities and patchy airspace opacities are seen throughout the superior segment of the left lower lobe. New nodular densities in the left upper lobe measuring up to 6 mm on image 6/56. Secretion/occlusion of the right lower lobe bronchus.  Peribronchial wall thickening in the right lower lobe and right middle lobe. New right upper lobe nodular density measuring 6 mm image 6/39. Calcified granuloma in the right upper lobe. No pleural effusion or pneumothorax. UPPER ABDOMEN: Limited images of the upper abdomen are unremarkable. SOFT TISSUES AND BONES: No acute bone or soft tissue abnormality. IMPRESSION: 1. No evidence of pulmonary embolism. 2. Airspace consolidation in the right middle lobe, right lower lobe, and minimally in the left lower lobe with air bronchograms compatible with multifocal pneumonia . 3. Additional tree-in-bud opacities and patchy airspace opacities in the superior segment of the left lower lobe compatible with infection . 4. Secretions in the right lower lobe bronchus likely related to aspiration. Follow-up recommended to exclude underlying lesion . 5. Multiple new solid pulmonary nodules up to 6 mm. Recommend non-contrast chest CT at 3-6 months, then consider an additional non-contrast chest CT at 18-24 months per Fleischner Society Guidelines. 6. Enlarged right hilar lymph nodes measuring up to 13 mm. Electronically signed by: Greig Pique MD 01/16/2024 07:08 PM EST RP Workstation: [id number]   DG Chest 2 View Result Date: 01/16/2024 EXAM: 2 VIEW(S) XRAY OF THE CHEST 01/16/2024 03:00:00 PM COMPARISON: 08/30/2023 CLINICAL HISTORY: shob FINDINGS: LUNGS AND PLEURA: There are trace bilateral pleural effusions. There is right middle lobe airspace consolidation and a small amount of patchy airspace disease in the right lower lobe. No pneumothorax. HEART AND MEDIASTINUM: No acute abnormality of the cardiac and mediastinal silhouettes. BONES AND SOFT TISSUES: No acute osseous abnormality. IMPRESSION: 1. Right middle lobe consolidation with additional patchy airspace disease in the right lower lobe, compatible with pneumonia. Follow-up imaging recommended in 4 to 6 weeks to confirm complete resolution. 2. Trace bilateral pleural  effusions. Electronically signed by: Greig Pique MD 01/16/2024 03:09 PM EST RP Workstation: [id number]    Microbiology: Results for orders placed or performed during the hospital encounter of 01/16/24  Resp panel by RT-PCR (RSV, Flu A&B, Covid) Anterior Nasal Swab     Status: None   Collection Time: 01/16/24  2:21 PM   Specimen: Anterior Nasal Swab  Result Value Ref Range Status   SARS Coronavirus 2 by RT PCR NEGATIVE NEGATIVE Final   Influenza A by PCR NEGATIVE NEGATIVE Final   Influenza B by PCR NEGATIVE NEGATIVE Final    Comment: (NOTE) The Xpert Xpress SARS-CoV-2/FLU/RSV plus assay is intended as an aid in the diagnosis of influenza from Nasopharyngeal swab specimens and should not be used as a sole basis for treatment. Nasal washings and aspirates are unacceptable for Xpert Xpress SARS-CoV-2/FLU/RSV testing.  Fact Sheet for Patients: bloggercourse.com  Fact Sheet for Healthcare Providers: seriousbroker.it  This test is not yet approved or cleared by the [place]  FDA and has been authorized for detection and/or diagnosis of SARS-CoV-2 by FDA under an Emergency Use Authorization (EUA). This EUA will remain in effect (meaning this test can be used) for the duration of the COVID-19 declaration under Section 564(b)(1) of the Act, 21 U.S.C. section 360bbb-3(b)(1), unless the authorization is terminated or revoked.     Resp Syncytial Virus by PCR NEGATIVE NEGATIVE Final  Comment: (NOTE) Fact Sheet for Patients: bloggercourse.com  Fact Sheet for Healthcare Providers: seriousbroker.it  This test is not yet approved or cleared by the [place]  FDA and has been authorized for detection and/or diagnosis of SARS-CoV-2 by FDA under an Emergency Use Authorization (EUA). This EUA will remain in effect (meaning this test can be used) for the duration of the COVID-19  declaration under Section 564(b)(1) of the Act, 21 U.S.C. section 360bbb-3(b)(1), unless the authorization is terminated or revoked.  Performed at [facility], [address] [place], [place] [address]   Respiratory (~20 pathogens) panel by PCR     Status: None   Collection Time: 01/16/24  2:21 PM   Specimen: Nasopharyngeal Swab; Respiratory  Result Value Ref Range Status   Adenovirus NOT DETECTED NOT DETECTED Final   Coronavirus 229E NOT DETECTED NOT DETECTED Final    Comment: (NOTE) The Coronavirus on the Respiratory Panel, DOES NOT test for the novel  Coronavirus (2019 nCoV)    Coronavirus HKU1 NOT DETECTED NOT DETECTED Final   Coronavirus NL63 NOT DETECTED NOT DETECTED Final   Coronavirus OC43 NOT DETECTED NOT DETECTED Final   Metapneumovirus NOT DETECTED NOT DETECTED Final   Rhinovirus / Enterovirus NOT DETECTED NOT DETECTED Final   Influenza A NOT DETECTED NOT DETECTED Final   Influenza B NOT DETECTED NOT DETECTED Final   Parainfluenza Virus 1 NOT DETECTED NOT DETECTED Final   Parainfluenza Virus 2 NOT DETECTED NOT DETECTED Final   Parainfluenza Virus 3 NOT DETECTED NOT DETECTED Final   Parainfluenza Virus 4 NOT DETECTED NOT DETECTED Final   Respiratory Syncytial Virus NOT DETECTED NOT DETECTED Final   Bordetella pertussis NOT DETECTED NOT DETECTED Final   Bordetella Parapertussis NOT DETECTED NOT DETECTED Final   Chlamydophila pneumoniae NOT DETECTED NOT DETECTED Final   Mycoplasma pneumoniae NOT DETECTED NOT DETECTED Final    Comment: Performed at [facility], [address], [place], [place] [address]  Blood culture (routine x 2)     Status: None   Collection Time: 01/16/24  3:15 PM   Specimen: BLOOD  Result Value Ref Range Status   Specimen Description BLOOD RIGHT ANTECUBITAL  Final   Special Requests   Final    BOTTLES DRAWN AEROBIC AND ANAEROBIC Blood Culture results may not be optimal due to an inadequate volume of blood received in culture bottles    Culture   Final    NO GROWTH 5 DAYS Performed at [facility], [address], [place], [place] [address]    Report Status 01/21/2024 FINAL  Final  MRSA Next Gen by PCR, Nasal     Status: None   Collection Time: 01/18/24 12:23 PM  Result Value Ref Range Status   MRSA by PCR Next Gen NOT DETECTED NOT DETECTED Final    Comment: (NOTE) The GeneXpert MRSA Assay (FDA approved for NASAL specimens only), is one component of a comprehensive MRSA colonization surveillance program. It is not intended to diagnose MRSA infection nor to guide or monitor treatment for MRSA infections. Test performance is not FDA approved in patients less than 49 years old. Performed at [facility], [address] [place], [place] [address]   Culture, Respiratory w Gram Stain     Status: None (Preliminary result)   Collection Time: 01/20/24  7:10 AM   Specimen: Bronchoalveolar Lavage; Respiratory  Result Value Ref Range Status   Specimen Description BRONCHIAL ALVEOLAR LAVAGE  Final   Special Requests Immunocompromised  Final   Gram Stain   Final    ABUNDANT WBC PRESENT, PREDOMINANTLY PMN NO ORGANISMS SEEN Performed at [facility], [address] [place], [place] [address]    Culture PENDING  Incomplete   Report Status PENDING  Incomplete    Labs: CBC: Recent Labs  Lab 01/16/24 1421 01/16/24 1529  WBC 9.1  --   NEUTROABS 7.6  --   HGB 11.8* 13.6  HCT 37.2 40.0  MCV 81.2  --   PLT 203  --    Basic Metabolic Panel: Recent Labs  Lab 01/16/24 1453 01/16/24 1529 01/16/24 2027  NA 135 137  --   K 3.5 3.5  --   CL 96* 97*  --   CO2 28  --   --   GLUCOSE 114* 112*  --   BUN 11 10  --   CREATININE 1.14* 1.10*  --   CALCIUM 8.5*  --   --   MG  --   --  1.5*   Liver Function Tests: Recent Labs  Lab 01/16/24 1453  AST 19  ALT 7  ALKPHOS 53  BILITOT 0.5  PROT 7.9  ALBUMIN  3.6    Discharge time spent: 35 minutes.  Signed: Elgin Lam, MD [facility] 01/21/2024 "

## 2024-01-21 NOTE — TOC Transition Note (Signed)
 Transition of Care [facility]) - Discharge Note   Patient Details  Name: Frances Ball MRN: [id number] Date of Birth: 06-13-1990  [facility] CM/SW Contact:  Andrez JULIANNA George, RN Phone Number: 01/21/2024, 12:56 PM   Clinical Narrative:     Pt is discharging home with self care. No needs per IP Care management.    Final next level of care: Home/Self Care Barriers to Discharge: No Barriers Identified   Patient Goals and CMS Choice            Discharge Placement                       Discharge Plan and Services Additional resources added to the After Visit Summary for                                       Social Drivers of Health (SDOH) Interventions SDOH Screenings   Food Insecurity: No Food Insecurity (01/18/2024)  Housing: Low Risk (01/18/2024)  Transportation Needs: No Transportation Needs (01/18/2024)  Utilities: Not At Risk (01/18/2024)  Depression (PHQ2-9): Low Risk (08/28/2022)  Tobacco Use: Low Risk (01/20/2024)     Readmission Risk Interventions    09/03/2023    3:17 PM  Readmission Risk Prevention Plan  PCP or Specialist Appt within 5-7 Days Complete  Home Care Screening Complete  Medication Review (RN CM) Complete

## 2024-01-22 LAB — HCV RNA QUANT: HCV Quantitative: NOT DETECTED [IU]/mL

## 2024-01-22 LAB — HIV-1 RNA QUANT-NO REFLEX-BLD
HIV 1 RNA Quant: 316000 {copies}/mL
LOG10 HIV-1 RNA: 5.5 {Log_copies}/mL

## 2024-01-22 NOTE — Progress Notes (Signed)
 "        [facility]  Date of Admission:  01/16/2024    Principal Problem:   Multifocal pneumonia Active Problems:   HIV disease (HCC)   Sepsis with acute hypoxic respiratory failure without septic shock [facility])   Immunocompromised          Assessment: 33 year old female with past medical history of HIV/AIDS with nonadherence to ART, history of IVDA, seizures posttreatment, chronic bronchiectasis with hospitalization in July 2025 for MSSA pneumonia presents with dyspnea found to have #Pneumonia, multifocal in the setting of chronic bronchiectasis #Bactrim allergy - Patient states she had burning sensation prior to hospitalization starting the day before.  She had associated shortness of breath that at that time.  She states her breathing was at baseline before these events. - CT showed d right middle lobe airspace, right lower lobe and  minimal left lower lobe air bronchograms compatible with multifocal pneumonia. right lower lobe concern for aspiration.  Multiple new pulmonary nodules up to 6 mm recommend follow-up CT - Patient started on PJP treatment atovaquone  given Bactrim allergy, vancomycin  and ceftriaxone  along with doxycycline  - LDH normal, CT does not seem consistent with PJP pneumonia given consolidative changes.  Patient's symptoms were pretty acute as such I suspect possible mucous plugging versus commune acquired pneumonia.  Will transition atovaquone  to PJP prophylaxis -MRSA PCR negative, vancomycin  stopped. #HIV/AIDS - Patient has not taken her Biktarvy  for the past 2 months due to transportation issues.  Amenable to restarting. - Will restart dapsone  for PJP prophylaxis as above, restart Biktarvy   Recommendations: -Switch to Cefadroxil   to complete 7 days of abx. No need to cover PsA as it did not gorw [facility] f/u with myself on 1/7-> then f/u with Corean Fireman -Follow Bronch studies -Continue biktarvy  and dapsone  -HCV rna  ND -Communicated plan to primary -Standard precautions -ID will Dennise off  Microbiology:   Antibiotics: Cefepime  12/14-present Biktarvy  and dapsone  Vancomycin  12/13-14 Atovaquone  12/14-15 Cultures: Blood  Urine  Other   SUBJECTIVE: [place] feels better overall Interval: Afebrile overnight  Review of Systems: Review of Systems  All other systems reviewed and are negative.    Scheduled Meds:   Continuous Infusions:   PRN Meds:. Allergies[1]  OBJECTIVE: Vitals:   01/21/24 0000 01/21/24 0409 01/21/24 0903 01/21/24 1123  BP:  116/81 100/83 109/73  Pulse:  (!) 59 71 64  Resp: 15 16 17 20   Temp:  98 F (36.7 C) 97.9 F (36.6 C) 98.1 F (36.7 C)  TempSrc:   Oral Oral  SpO2:  99% 97% 98%  Weight:      Height:       Body mass index is 18.29 kg/m.  Physical Exam Constitutional:      Appearance: Normal appearance.  HENT:     Head: Normocephalic and atraumatic.     Right Ear: Tympanic membrane normal.     Left Ear: Tympanic membrane normal.     Nose: Nose normal.     Mouth/Throat:     Mouth: Mucous membranes are moist.  Eyes:     Extraocular Movements: Extraocular movements intact.     Conjunctiva/sclera: Conjunctivae normal.     Pupils: Pupils are equal, round, and reactive to light.  Cardiovascular:     Rate and Rhythm: Normal rate and regular rhythm.     Heart sounds: No murmur heard.    No friction rub. No gallop.  Pulmonary:     Effort: Pulmonary effort is normal.  Breath sounds: Normal breath sounds.  Abdominal:     General: Abdomen is flat.     Palpations: Abdomen is soft.  Musculoskeletal:        General: Normal range of motion.  Skin:    General: Skin is warm and dry.  Neurological:     General: No focal deficit present.     Mental Status: She is alert and oriented to person, place, and time.  Psychiatric:        Mood and Affect: Mood normal.       Lab Results Lab Results  Component Value Date   WBC 9.1 01/16/2024   HGB  13.6 01/16/2024   HCT 40.0 01/16/2024   MCV 81.2 01/16/2024   PLT 203 01/16/2024    Lab Results  Component Value Date   CREATININE 1.10 (H) 01/16/2024   BUN 10 01/16/2024   NA 137 01/16/2024   K 3.5 01/16/2024   CL 97 (L) 01/16/2024   CO2 28 01/16/2024    Lab Results  Component Value Date   ALT 7 01/16/2024   AST 19 01/16/2024   GGT 12 10/07/2017   ALKPHOS 53 01/16/2024   BILITOT 0.5 01/16/2024        Loney Stank, MD [facility] 01/22/2024, 8:17 AM   Evaluation of this patient requires complex antimicrobial therapy evaluation and counseling + isolation needs for disease transmission risk assessment and mitigation      [1]  Allergies Allergen Reactions   Levaquin [Levofloxacin In D5w] Shortness Of Breath and Rash   Sulfa Antibiotics Swelling    Facial swelling and burning   Vancomycin  Itching, Swelling and Other (See Comments)    Face swelled turned red with burning and itching   Zithromax [Azithromycin] Shortness Of Breath, Swelling and Other (See Comments)    Pt reports tightness in skin with redness    Bactrim [Sulfamethoxazole-Trimethoprim] Swelling and Other (See Comments)    Pt reports that she was placed on Bactrim for pneumonia when she was d/c from [facility] and developed a red face with swelling and can not tolerate Bactrim.    Darunavir  Rash   "

## 2024-01-23 ENCOUNTER — Encounter (HOSPITAL_COMMUNITY): Payer: Self-pay | Admitting: Internal Medicine

## 2024-01-23 LAB — CULTURE, RESPIRATORY W GRAM STAIN: Culture: NORMAL

## 2024-01-23 LAB — ACID FAST SMEAR (AFB, MYCOBACTERIA): Acid Fast Smear: NEGATIVE

## 2024-01-24 ENCOUNTER — Ambulatory Visit: Payer: Self-pay | Admitting: Infectious Diseases

## 2024-01-24 LAB — CYTOLOGY - NON PAP

## 2024-01-31 ENCOUNTER — Emergency Department (HOSPITAL_COMMUNITY)

## 2024-01-31 ENCOUNTER — Inpatient Hospital Stay (HOSPITAL_COMMUNITY)
Admission: EM | Admit: 2024-01-31 | Discharge: 2024-02-04 | DRG: 974 | Disposition: A | Discharge status: Home / Self Care | Attending: Internal Medicine | Admitting: Internal Medicine

## 2024-01-31 ENCOUNTER — Other Ambulatory Visit: Payer: Self-pay

## 2024-01-31 DIAGNOSIS — Z79899 Other long term (current) drug therapy: Secondary | ICD-10-CM

## 2024-01-31 DIAGNOSIS — R64 Cachexia: Secondary | ICD-10-CM | POA: Diagnosis present

## 2024-01-31 DIAGNOSIS — Z20828 Contact with and (suspected) exposure to other viral communicable diseases: Secondary | ICD-10-CM | POA: Diagnosis present

## 2024-01-31 DIAGNOSIS — Z8701 Personal history of pneumonia (recurrent): Secondary | ICD-10-CM

## 2024-01-31 DIAGNOSIS — J1001 Influenza due to other identified influenza virus with the same other identified influenza virus pneumonia: Secondary | ICD-10-CM | POA: Diagnosis present

## 2024-01-31 DIAGNOSIS — Z882 Allergy status to sulfonamides status: Secondary | ICD-10-CM

## 2024-01-31 DIAGNOSIS — Z883 Allergy status to other anti-infective agents status: Secondary | ICD-10-CM

## 2024-01-31 DIAGNOSIS — E872 Acidosis, unspecified: Secondary | ICD-10-CM | POA: Diagnosis present

## 2024-01-31 DIAGNOSIS — J189 Pneumonia, unspecified organism: Secondary | ICD-10-CM | POA: Diagnosis present

## 2024-01-31 DIAGNOSIS — I9589 Other hypotension: Secondary | ICD-10-CM | POA: Diagnosis present

## 2024-01-31 DIAGNOSIS — Z9089 Acquired absence of other organs: Secondary | ICD-10-CM

## 2024-01-31 DIAGNOSIS — Z681 Body mass index (BMI) 19 or less, adult: Secondary | ICD-10-CM

## 2024-01-31 DIAGNOSIS — Z8619 Personal history of other infectious and parasitic diseases: Secondary | ICD-10-CM

## 2024-01-31 DIAGNOSIS — E871 Hypo-osmolality and hyponatremia: Secondary | ICD-10-CM | POA: Diagnosis present

## 2024-01-31 DIAGNOSIS — F4310 Post-traumatic stress disorder, unspecified: Secondary | ICD-10-CM | POA: Diagnosis present

## 2024-01-31 DIAGNOSIS — F1191 Opioid use, unspecified, in remission: Secondary | ICD-10-CM | POA: Diagnosis present

## 2024-01-31 DIAGNOSIS — D682 Hereditary deficiency of other clotting factors: Secondary | ICD-10-CM | POA: Diagnosis present

## 2024-01-31 DIAGNOSIS — Z91410 Personal history of adult physical and sexual abuse: Secondary | ICD-10-CM

## 2024-01-31 DIAGNOSIS — E876 Hypokalemia: Secondary | ICD-10-CM | POA: Diagnosis present

## 2024-01-31 DIAGNOSIS — Z9049 Acquired absence of other specified parts of digestive tract: Secondary | ICD-10-CM

## 2024-01-31 DIAGNOSIS — Z881 Allergy status to other antibiotic agents status: Secondary | ICD-10-CM

## 2024-01-31 DIAGNOSIS — Z1152 Encounter for screening for COVID-19: Secondary | ICD-10-CM

## 2024-01-31 DIAGNOSIS — J101 Influenza due to other identified influenza virus with other respiratory manifestations: Principal | ICD-10-CM | POA: Diagnosis present

## 2024-01-31 DIAGNOSIS — A4189 Other specified sepsis: Principal | ICD-10-CM | POA: Diagnosis present

## 2024-01-31 DIAGNOSIS — Z833 Family history of diabetes mellitus: Secondary | ICD-10-CM

## 2024-01-31 DIAGNOSIS — B20 Human immunodeficiency virus [HIV] disease: Secondary | ICD-10-CM | POA: Diagnosis present

## 2024-01-31 DIAGNOSIS — Z98891 History of uterine scar from previous surgery: Secondary | ICD-10-CM

## 2024-01-31 DIAGNOSIS — R636 Underweight: Secondary | ICD-10-CM | POA: Diagnosis present

## 2024-01-31 DIAGNOSIS — D638 Anemia in other chronic diseases classified elsewhere: Secondary | ICD-10-CM | POA: Diagnosis present

## 2024-01-31 DIAGNOSIS — R918 Other nonspecific abnormal finding of lung field: Secondary | ICD-10-CM | POA: Diagnosis present

## 2024-01-31 DIAGNOSIS — J159 Unspecified bacterial pneumonia: Secondary | ICD-10-CM | POA: Diagnosis present

## 2024-01-31 DIAGNOSIS — Z79624 Long term (current) use of inhibitors of nucleotide synthesis: Secondary | ICD-10-CM

## 2024-01-31 DIAGNOSIS — D500 Iron deficiency anemia secondary to blood loss (chronic): Secondary | ICD-10-CM | POA: Diagnosis present

## 2024-01-31 DIAGNOSIS — E861 Hypovolemia: Secondary | ICD-10-CM | POA: Diagnosis present

## 2024-01-31 DIAGNOSIS — Z9889 Other specified postprocedural states: Secondary | ICD-10-CM

## 2024-01-31 DIAGNOSIS — Z8249 Family history of ischemic heart disease and other diseases of the circulatory system: Secondary | ICD-10-CM

## 2024-01-31 DIAGNOSIS — D6851 Activated protein C resistance: Secondary | ICD-10-CM | POA: Diagnosis present

## 2024-01-31 DIAGNOSIS — I959 Hypotension, unspecified: Secondary | ICD-10-CM

## 2024-01-31 DIAGNOSIS — R6520 Severe sepsis without septic shock: Secondary | ICD-10-CM | POA: Diagnosis present

## 2024-01-31 DIAGNOSIS — D72819 Decreased white blood cell count, unspecified: Secondary | ICD-10-CM | POA: Diagnosis present

## 2024-01-31 DIAGNOSIS — J470 Bronchiectasis with acute lower respiratory infection: Secondary | ICD-10-CM | POA: Diagnosis present

## 2024-01-31 LAB — CBC WITH DIFFERENTIAL/PLATELET
Abs Immature Granulocytes: 0.07 K/uL (ref 0.00–0.07)
Basophils Absolute: 0 K/uL (ref 0.0–0.1)
Basophils Relative: 0 %
Eosinophils Absolute: 0 K/uL (ref 0.0–0.5)
Eosinophils Relative: 0 %
HCT: 33.3 % — ABNORMAL LOW (ref 36.0–46.0)
Hemoglobin: 10.4 g/dL — ABNORMAL LOW (ref 12.0–15.0)
Immature Granulocytes: 1 %
Lymphocytes Relative: 14 %
Lymphs Abs: 1.1 K/uL (ref 0.7–4.0)
MCH: 25.9 pg — ABNORMAL LOW (ref 26.0–34.0)
MCHC: 31.2 g/dL (ref 30.0–36.0)
MCV: 82.8 fL (ref 80.0–100.0)
Monocytes Absolute: 0.4 K/uL (ref 0.1–1.0)
Monocytes Relative: 5 %
Neutro Abs: 6.6 K/uL (ref 1.7–7.7)
Neutrophils Relative %: 80 %
Platelets: 224 K/uL (ref 150–400)
RBC: 4.02 MIL/uL (ref 3.87–5.11)
RDW: 16.3 % — ABNORMAL HIGH (ref 11.5–15.5)
WBC: 8.2 K/uL (ref 4.0–10.5)
nRBC: 0 % (ref 0.0–0.2)

## 2024-01-31 LAB — RESP PANEL BY RT-PCR (RSV, FLU A&B, COVID)  RVPGX2
Influenza A by PCR: POSITIVE — AB
Influenza B by PCR: NEGATIVE
Resp Syncytial Virus by PCR: NEGATIVE
SARS Coronavirus 2 by RT PCR: NEGATIVE

## 2024-01-31 LAB — COMPREHENSIVE METABOLIC PANEL WITH GFR
ALT: 7 U/L (ref 0–44)
AST: 39 U/L (ref 15–41)
Albumin: 4 g/dL (ref 3.5–5.0)
Alkaline Phosphatase: 48 U/L (ref 38–126)
Anion gap: 20 — ABNORMAL HIGH (ref 5–15)
BUN: 11 mg/dL (ref 6–20)
CO2: 21 mmol/L — ABNORMAL LOW (ref 22–32)
Calcium: 8.3 mg/dL — ABNORMAL LOW (ref 8.9–10.3)
Chloride: 92 mmol/L — ABNORMAL LOW (ref 98–111)
Creatinine, Ser: 1.05 mg/dL — ABNORMAL HIGH (ref 0.44–1.00)
GFR, Estimated: >60 mL/min
Glucose, Bld: 75 mg/dL (ref 70–99)
Potassium: 3.6 mmol/L (ref 3.5–5.1)
Sodium: 133 mmol/L — ABNORMAL LOW (ref 135–145)
Total Bilirubin: 0.4 mg/dL (ref 0.0–1.2)
Total Protein: 7.6 g/dL (ref 6.5–8.1)

## 2024-01-31 LAB — I-STAT CG4 LACTIC ACID, ED: Lactic Acid, Venous: 1.2 mmol/L (ref 0.5–1.9)

## 2024-01-31 LAB — HCG, SERUM, QUALITATIVE: Preg, Serum: NEGATIVE

## 2024-01-31 MED ORDER — LACTATED RINGERS IV BOLUS
1000.0000 mL | Freq: Once | INTRAVENOUS | Status: AC
  Administered 2024-02-01: 1000 mL via INTRAVENOUS

## 2024-01-31 MED ORDER — LACTATED RINGERS IV BOLUS
1000.0000 mL | Freq: Once | INTRAVENOUS | Status: AC
  Administered 2024-01-31: 1000 mL via INTRAVENOUS

## 2024-01-31 MED ORDER — ACETAMINOPHEN 325 MG PO TABS
650.0000 mg | ORAL_TABLET | Freq: Once | ORAL | Status: AC | PRN
  Administered 2024-01-31: 650 mg via ORAL
  Filled 2024-01-31: qty 2

## 2024-01-31 MED ORDER — OSELTAMIVIR PHOSPHATE 75 MG PO CAPS
75.0000 mg | ORAL_CAPSULE | Freq: Once | ORAL | Status: DC
  Filled 2024-01-31: qty 1

## 2024-01-31 NOTE — ED Triage Notes (Addendum)
 BIB EMS/ c/o flu-like symptoms/ exposure to flu from family in home/ dx with PNA x3 weeks ago/ hx of HIV/ also reports weight loss x 3 weeks/ A&OX4  100/70 122HR 22RR 89% RA 92% 2L

## 2024-01-31 NOTE — ED Notes (Signed)
 Attempted to obtain 2nd [facility] patient said that hurts and asked for me to stop. Unable to get 2nd BC.

## 2024-01-31 NOTE — ED Provider Notes (Incomplete)
 " [facility] Provider Note   CSN: [id number] Arrival date & time: 01/31/24  8176     Patient presents with: flu-like symptoms   Frances HOEFER is a 33 y.o. female.  Patient with past medical history significant for AIDS, factor V deficiency, anemia of chronic disease, hepatitis C cured after antiviral drug therapy, opioid use disorder in sustained remission, weight loss, recent diagnosis of pneumonia presents to the emergency department complaining of approximately 4 days of flulike symptoms with likely exposure to flu from children in the home.  She states her temperature at home earlier today was 104 F.  She took Excedrin  prior to arrival and temperature improved to 101.1.  She was administered Tylenol  with further improvement of fever.  Patient endorses nausea, body aches, fever, chills, shortness of breath.  Patient also reports being admitted recently and diagnosed with pneumonia.  She denies abdominal pain, emesis.  {Add pertinent medical, surgical, social history, OB history to HPI:32947} HPI     Prior to Admission medications  Medication Sig Start Date End Date Taking? Authorizing Provider  albuterol  (VENTOLIN  HFA) 108 (90 Base) MCG/ACT inhaler Inhale 2 puffs into the lungs every 6 (six) hours as needed for wheezing or shortness of breath.    [provider]  aspirin -acetaminophen -caffeine  (EXCEDRIN  MIGRAINE) 250-250-65 MG tablet Take 2 tablets by mouth every 6 (six) hours as needed for headache (headache, pain).    [provider]  bictegravir-emtricitabine -tenofovir  AF (BIKTARVY ) 50-200-25 MG TABS tablet Take 1 tablet by mouth daily. 01/19/24   Dennise Kingsley, MD  buprenorphine  (SUBUTEX ) 8 MG SUBL SL tablet Place 8 mg under the tongue daily as needed (Pain management). 08/10/23   [provider]  dapsone  100 MG tablet Take 1 tablet (100 mg total) by mouth daily. 01/21/24   Dennise Kingsley, MD  ondansetron  (ZOFRAN )  4 MG tablet Take 1-2 tablets 4 mg by mouth every 8 (eight) hours as needed for nausea or vomiting. 11/30/22   Melvenia Corean SAILOR, NP    Allergies: Levaquin [levofloxacin in d5w], Sulfa antibiotics, Vancomycin , Zithromax [azithromycin], Bactrim [sulfamethoxazole-trimethoprim], and Darunavir     Review of Systems  Updated Vital Signs BP (!) 84/63   Pulse (!) 48   Temp 99.4 F (37.4 C)   Resp 12   Wt 40 kg   LMP  (LMP Unknown)   SpO2 100%   BMI 16.13 kg/m   Physical Exam Vitals and nursing note reviewed.  Constitutional:      General: She is not in acute distress.    Appearance: She is well-developed. She is ill-appearing.  HENT:     Head: Normocephalic and atraumatic.  Eyes:     Conjunctiva/sclera: Conjunctivae normal.  Cardiovascular:     Rate and Rhythm: Normal rate and regular rhythm.  Pulmonary:     Effort: Pulmonary effort is normal. No respiratory distress.     Breath sounds: Normal breath sounds.  Abdominal:     Palpations: Abdomen is soft.     Tenderness: There is no abdominal tenderness.  Musculoskeletal:        General: No swelling.     Cervical back: Neck supple.  Skin:    General: Skin is warm and dry.     Capillary Refill: Capillary refill takes less than 2 seconds.  Neurological:     Mental Status: She is alert.  Psychiatric:        Mood and Affect: Mood normal.     (all labs ordered are  listed, but only abnormal results are displayed) Labs Reviewed  RESP PANEL BY RT-PCR (RSV, FLU A&B, COVID)  RVPGX2 - Abnormal; Notable for the following components:      Result Value   Influenza A by PCR POSITIVE (*)    All other components within normal limits  CBC WITH DIFFERENTIAL/PLATELET - Abnormal; Notable for the following components:   Hemoglobin 10.4 (*)    HCT 33.3 (*)    MCH 25.9 (*)    RDW 16.3 (*)    All other components within normal limits  CULTURE, BLOOD (ROUTINE X 2)  CULTURE, BLOOD (ROUTINE X 2)  HCG, SERUM, QUALITATIVE  COMPREHENSIVE  METABOLIC PANEL WITH GFR  PROTIME-INR  I-STAT CG4 LACTIC ACID, ED  I-STAT CG4 LACTIC ACID, ED    EKG: None  Radiology: DG Chest 2 View Result Date: 01/31/2024 CLINICAL DATA:  Flu-like symptoms. EXAM: CHEST - 2 VIEW COMPARISON:  Radiograph and CT 01/16/2024 FINDINGS: Persistent but improved patchy opacity in the right middle and lower lobe. There may be a trace right pleural effusion. No new or progressive airspace disease. Streaky opacities in the medial left lung base correspond to bronchiectasis on CT. No pneumothorax. The heart is normal in size. IMPRESSION: Persistent but improved patchy opacity in the right middle and lower lobe over the last 15 days. There may be a trace right pleural effusion. Electronically Signed   By: Andrea Gasman M.D.   On: 01/31/2024 22:18    {Document cardiac monitor, telemetry assessment procedure when appropriate:32947} Procedures   Medications Ordered in the ED  lactated ringers  bolus 1,000 mL (has no administration in time range)  acetaminophen  (TYLENOL ) tablet 650 mg (650 mg Oral Given 01/31/24 1925)  lactated ringers  bolus 1,000 mL (1,000 mLs Intravenous New Bag/Given 01/31/24 2157)      {Click here for ABCD2, HEART and other calculators REFRESH Note before signing:1}                              Medical Decision Making  ***  {Document critical care time when appropriate  Document review of labs and clinical decision tools ie CHADS2VASC2, etc  Document your independent review of radiology images and any outside records  Document your discussion with family members, caretakers and with consultants  Document social determinants of health affecting pt's care  Document your decision making why or why not admission, treatments were needed:32947:::1}   Final diagnoses:  None    ED Discharge Orders     None        "

## 2024-02-01 DIAGNOSIS — I9589 Other hypotension: Secondary | ICD-10-CM | POA: Diagnosis present

## 2024-02-01 DIAGNOSIS — I959 Hypotension, unspecified: Secondary | ICD-10-CM | POA: Diagnosis not present

## 2024-02-01 DIAGNOSIS — J189 Pneumonia, unspecified organism: Secondary | ICD-10-CM | POA: Diagnosis present

## 2024-02-01 DIAGNOSIS — R6520 Severe sepsis without septic shock: Secondary | ICD-10-CM | POA: Diagnosis present

## 2024-02-01 DIAGNOSIS — E871 Hypo-osmolality and hyponatremia: Secondary | ICD-10-CM | POA: Diagnosis present

## 2024-02-01 DIAGNOSIS — R64 Cachexia: Secondary | ICD-10-CM | POA: Diagnosis present

## 2024-02-01 DIAGNOSIS — B20 Human immunodeficiency virus [HIV] disease: Secondary | ICD-10-CM | POA: Diagnosis present

## 2024-02-01 DIAGNOSIS — D649 Anemia, unspecified: Secondary | ICD-10-CM | POA: Diagnosis not present

## 2024-02-01 DIAGNOSIS — D72819 Decreased white blood cell count, unspecified: Secondary | ICD-10-CM | POA: Diagnosis present

## 2024-02-01 DIAGNOSIS — J470 Bronchiectasis with acute lower respiratory infection: Secondary | ICD-10-CM | POA: Diagnosis present

## 2024-02-01 DIAGNOSIS — D6851 Activated protein C resistance: Secondary | ICD-10-CM | POA: Diagnosis present

## 2024-02-01 DIAGNOSIS — J101 Influenza due to other identified influenza virus with other respiratory manifestations: Secondary | ICD-10-CM | POA: Diagnosis present

## 2024-02-01 DIAGNOSIS — J479 Bronchiectasis, uncomplicated: Secondary | ICD-10-CM

## 2024-02-01 DIAGNOSIS — D638 Anemia in other chronic diseases classified elsewhere: Secondary | ICD-10-CM | POA: Diagnosis present

## 2024-02-01 DIAGNOSIS — A4189 Other specified sepsis: Secondary | ICD-10-CM | POA: Diagnosis present

## 2024-02-01 DIAGNOSIS — J159 Unspecified bacterial pneumonia: Secondary | ICD-10-CM | POA: Diagnosis present

## 2024-02-01 DIAGNOSIS — R918 Other nonspecific abnormal finding of lung field: Secondary | ICD-10-CM | POA: Diagnosis present

## 2024-02-01 DIAGNOSIS — E876 Hypokalemia: Secondary | ICD-10-CM

## 2024-02-01 DIAGNOSIS — J09X1 Influenza due to identified novel influenza A virus with pneumonia: Secondary | ICD-10-CM

## 2024-02-01 DIAGNOSIS — Z1152 Encounter for screening for COVID-19: Secondary | ICD-10-CM | POA: Diagnosis not present

## 2024-02-01 DIAGNOSIS — J1001 Influenza due to other identified influenza virus with the same other identified influenza virus pneumonia: Secondary | ICD-10-CM | POA: Diagnosis present

## 2024-02-01 DIAGNOSIS — A419 Sepsis, unspecified organism: Secondary | ICD-10-CM

## 2024-02-01 DIAGNOSIS — E861 Hypovolemia: Secondary | ICD-10-CM | POA: Diagnosis present

## 2024-02-01 DIAGNOSIS — E872 Acidosis, unspecified: Secondary | ICD-10-CM | POA: Diagnosis present

## 2024-02-01 DIAGNOSIS — F4310 Post-traumatic stress disorder, unspecified: Secondary | ICD-10-CM | POA: Diagnosis present

## 2024-02-01 DIAGNOSIS — D500 Iron deficiency anemia secondary to blood loss (chronic): Secondary | ICD-10-CM | POA: Diagnosis present

## 2024-02-01 DIAGNOSIS — Z8619 Personal history of other infectious and parasitic diseases: Secondary | ICD-10-CM | POA: Diagnosis not present

## 2024-02-01 DIAGNOSIS — Z681 Body mass index (BMI) 19 or less, adult: Secondary | ICD-10-CM | POA: Diagnosis not present

## 2024-02-01 DIAGNOSIS — D682 Hereditary deficiency of other clotting factors: Secondary | ICD-10-CM | POA: Diagnosis present

## 2024-02-01 DIAGNOSIS — R636 Underweight: Secondary | ICD-10-CM | POA: Diagnosis present

## 2024-02-01 DIAGNOSIS — Z21 Asymptomatic human immunodeficiency virus [HIV] infection status: Secondary | ICD-10-CM | POA: Diagnosis not present

## 2024-02-01 LAB — CBC
HCT: 26.5 % — ABNORMAL LOW (ref 36.0–46.0)
Hemoglobin: 8.2 g/dL — ABNORMAL LOW (ref 12.0–15.0)
MCH: 25.5 pg — ABNORMAL LOW (ref 26.0–34.0)
MCHC: 30.9 g/dL (ref 30.0–36.0)
MCV: 82.6 fL (ref 80.0–100.0)
Platelets: 132 K/uL — ABNORMAL LOW (ref 150–400)
RBC: 3.21 MIL/uL — ABNORMAL LOW (ref 3.87–5.11)
RDW: 16.4 % — ABNORMAL HIGH (ref 11.5–15.5)
WBC: 6.2 K/uL (ref 4.0–10.5)
nRBC: 0 % (ref 0.0–0.2)

## 2024-02-01 LAB — MRSA NEXT GEN BY PCR, NASAL: MRSA by PCR Next Gen: NOT DETECTED

## 2024-02-01 LAB — BASIC METABOLIC PANEL WITH GFR
Anion gap: 12 (ref 5–15)
BUN: 8 mg/dL (ref 6–20)
CO2: 24 mmol/L (ref 22–32)
Calcium: 7.6 mg/dL — ABNORMAL LOW (ref 8.9–10.3)
Chloride: 100 mmol/L (ref 98–111)
Creatinine, Ser: 0.77 mg/dL (ref 0.44–1.00)
GFR, Estimated: >60 mL/min
Glucose, Bld: 79 mg/dL (ref 70–99)
Potassium: 3.6 mmol/L (ref 3.5–5.1)
Sodium: 136 mmol/L (ref 135–145)

## 2024-02-01 LAB — PROTIME-INR
INR: 1 (ref 0.8–1.2)
Prothrombin Time: 13.3 s (ref 11.4–15.2)

## 2024-02-01 LAB — MAGNESIUM: Magnesium: 1.7 mg/dL (ref 1.7–2.4)

## 2024-02-01 LAB — PROCALCITONIN: Procalcitonin: 1 ng/mL

## 2024-02-01 LAB — CBG MONITORING, ED: Glucose-Capillary: 84 mg/dL (ref 70–99)

## 2024-02-01 LAB — PHOSPHORUS: Phosphorus: 2.6 mg/dL (ref 2.5–4.6)

## 2024-02-01 LAB — I-STAT CG4 LACTIC ACID, ED: Lactic Acid, Venous: 1 mmol/L (ref 0.5–1.9)

## 2024-02-01 LAB — GLUCOSE, CAPILLARY: Glucose-Capillary: 109 mg/dL — ABNORMAL HIGH (ref 70–99)

## 2024-02-01 LAB — CORTISOL: Cortisol, Plasma: 10.8 ug/dL

## 2024-02-01 MED ORDER — MIDODRINE HCL 5 MG PO TABS
10.0000 mg | ORAL_TABLET | Freq: Three times a day (TID) | ORAL | Status: DC
  Administered 2024-02-01: 10 mg via ORAL
  Filled 2024-02-01: qty 2

## 2024-02-01 MED ORDER — POTASSIUM CHLORIDE CRYS ER 20 MEQ PO TBCR
40.0000 meq | EXTENDED_RELEASE_TABLET | Freq: Once | ORAL | Status: AC
  Administered 2024-02-01: 40 meq via ORAL
  Filled 2024-02-01: qty 2

## 2024-02-01 MED ORDER — NOREPINEPHRINE 4 MG/250ML-% IV SOLN: 0.0000 ug/min | INTRAVENOUS | Status: DC

## 2024-02-01 MED ORDER — POLYETHYLENE GLYCOL 3350 17 G PO PACK: 17.0000 g | PACK | Freq: Every day | ORAL | Status: DC | PRN

## 2024-02-01 MED ORDER — LACTATED RINGERS IV BOLUS
1000.0000 mL | Freq: Once | INTRAVENOUS | Status: AC
  Administered 2024-02-01: 1000 mL via INTRAVENOUS

## 2024-02-01 MED ORDER — BICTEGRAVIR-EMTRICITAB-TENOFOV 50-200-25 MG PO TABS
1.0000 | ORAL_TABLET | Freq: Every day | ORAL | Status: DC
  Administered 2024-02-01: 1 via ORAL
  Filled 2024-02-01 (×3): qty 1

## 2024-02-01 MED ORDER — OSELTAMIVIR PHOSPHATE 75 MG PO CAPS
75.0000 mg | ORAL_CAPSULE | Freq: Once | ORAL | Status: DC
  Filled 2024-02-01: qty 1

## 2024-02-01 MED ORDER — MAGNESIUM SULFATE 2 GM/50ML IV SOLN
2.0000 g | Freq: Once | INTRAVENOUS | Status: AC
  Administered 2024-02-01: 2 g via INTRAVENOUS
  Filled 2024-02-01: qty 50

## 2024-02-01 MED ORDER — SODIUM CHLORIDE 0.9 % IV SOLN
100.0000 mg | Freq: Two times a day (BID) | INTRAVENOUS | Status: DC
  Administered 2024-02-01 – 2024-02-02 (×3): 100 mg via INTRAVENOUS
  Filled 2024-02-01 (×4): qty 100

## 2024-02-01 MED ORDER — SODIUM CHLORIDE 0.9 % IV SOLN
2.0000 g | Freq: Two times a day (BID) | INTRAVENOUS | Status: DC
  Administered 2024-02-01: 2 g via INTRAVENOUS
  Filled 2024-02-01: qty 12.5

## 2024-02-01 MED ORDER — SODIUM CHLORIDE 0.9 % IV SOLN: 250.0000 mL | INTRAVENOUS | Status: AC

## 2024-02-01 MED ORDER — LINEZOLID 600 MG/300ML IV SOLN
600.0000 mg | Freq: Two times a day (BID) | INTRAVENOUS | Status: DC
  Administered 2024-02-01: 600 mg via INTRAVENOUS
  Filled 2024-02-01: qty 300

## 2024-02-01 MED ORDER — OSELTAMIVIR PHOSPHATE 75 MG PO CAPS
75.0000 mg | ORAL_CAPSULE | Freq: Two times a day (BID) | ORAL | Status: DC
  Administered 2024-02-01: 75 mg via ORAL
  Filled 2024-02-01 (×7): qty 1

## 2024-02-01 MED ORDER — SODIUM CHLORIDE 0.9 % IV SOLN
1.0000 g | INTRAVENOUS | Status: DC
  Administered 2024-02-01: 1 g via INTRAVENOUS
  Filled 2024-02-01: qty 10

## 2024-02-01 MED ORDER — DOCUSATE SODIUM 100 MG PO CAPS: 100.0000 mg | ORAL_CAPSULE | Freq: Two times a day (BID) | ORAL | Status: DC | PRN

## 2024-02-01 MED ORDER — ENOXAPARIN SODIUM 300 MG/3ML IJ SOLN
20.0000 mg | INTRAMUSCULAR | Status: DC
  Filled 2024-02-01 (×4): qty 0.2

## 2024-02-01 MED ORDER — DAPSONE 100 MG PO TABS
100.0000 mg | ORAL_TABLET | Freq: Every day | ORAL | Status: DC
  Administered 2024-02-01 – 2024-02-04 (×4): 100 mg via ORAL
  Filled 2024-02-01 (×5): qty 1

## 2024-02-01 MED ORDER — IPRATROPIUM-ALBUTEROL 0.5-2.5 (3) MG/3ML IN SOLN: 3.0000 mL | RESPIRATORY_TRACT | Status: DC | PRN

## 2024-02-01 MED ORDER — CHLORHEXIDINE GLUCONATE CLOTH 2 % EX PADS
6.0000 | MEDICATED_PAD | Freq: Every day | CUTANEOUS | Status: DC
  Administered 2024-02-01 – 2024-02-04 (×4): 6 via TOPICAL

## 2024-02-01 MED ORDER — OSELTAMIVIR PHOSPHATE 30 MG PO CAPS
30.0000 mg | ORAL_CAPSULE | Freq: Two times a day (BID) | ORAL | Status: DC
  Filled 2024-02-01: qty 1

## 2024-02-01 MED ORDER — LACTATED RINGERS IV SOLN: INTRAVENOUS | Status: AC

## 2024-02-01 MED ORDER — ACETAMINOPHEN 325 MG PO TABS
650.0000 mg | ORAL_TABLET | ORAL | Status: DC | PRN
  Filled 2024-02-01: qty 2

## 2024-02-01 MED ORDER — BOOST / RESOURCE BREEZE PO LIQD CUSTOM
1.0000 | Freq: Three times a day (TID) | ORAL | Status: DC
  Administered 2024-02-01 – 2024-02-02 (×4): 1 via ORAL

## 2024-02-01 NOTE — ED Notes (Signed)
 Pt advised that she needs a urine sample. Pt stated when they need to urinate they will let us  know.

## 2024-02-01 NOTE — Plan of Care (Signed)
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
## 2024-02-01 NOTE — Progress Notes (Signed)
 Brief PCCM Progress Note  Frances Ball is a 33 year old female with history of HIV/AIDS (non-adherent to ART regimen due to side effects), chronic bronchiectasis, h/o IVDU, Factor V Leiden, and HCV who presented to [facility] with Flu A pneumonia and hypotension.   Subjective: Patient says breathing feels comfortable. Symptoms started christmas eve. She has not had much of an appetite. She has been compliant with home dapsone  and ART since last admission.  Objective:    02/01/2024   10:21 AM 02/01/2024    9:45 AM 02/01/2024    8:15 AM  Vitals with BMI  Height 5' 2    Weight 96 lbs 5 oz    BMI 17.62    Systolic 95 82 88  Diastolic 73 65 68  Pulse  89 87   Physical Exam: General: chronically ill appearing female, resting in bed, NAD HEENT: [place]/AT, sclera anicteric, pale Neuro: alert and responsive Pulm: breathing comfortably on room air CV: regular rate and rhythm GI: soft, non-tender, non-distended Extremities: warm, dry, no edema  Assessment/Plan:  Influenza A pneumonia; breathing comfortably on room air Possible superimposed bacterial pneumonia; pro-cal 1.00. 12/18 bronch with normal respiratory flora, many WBCs, negative AFB Chronic bronchiectasis Hypotension, improving; likely related to hypovolemia given poor PO intake over past several days, patient also with low normal blood pressure at baseline, on recent admission SBP mostly 80-90's and intermittently 100's  History of HIV; CD4 148 on 12/14.  - Continue tamiflu  - Follow-up sputum culture - Continue cefepime  and doxycycline , stop linezolid  with negative MRSA nares - Continue IVF resuscitation, stop midodrine   - Continue home ART  - Continue home dapsone  for PJP prophylaxis   Hypokalemia/hypomagnesemia - Replete   Will transfer to progressive and request TRH assume care 12/31. Will be available PRN.   Diet: regular diet VTE ppx: SQ enoxaparin    Rexene LOISE Blush, PA-C [facility] 02/01/2024 12:06  PM  Please see [email or web address] for pager details.  From 7A-7P if no response, please call [phone] After hours, please call ELink [phone]

## 2024-02-01 NOTE — ED Notes (Signed)
 Pt used bedpan unassisted, wipes at bedside.

## 2024-02-01 NOTE — ED Notes (Signed)
 Pt BP 78/57 MAP 64, PA [place] notified and orders placed for bolus

## 2024-02-01 NOTE — Progress Notes (Signed)
 Critical care attending attestation note:   I agree with the Advanced Practitioner's note, impression, and recommendations as outlined. I have taken an independent interval history, reviewed the chart and examined the patient. The following reflects my medical decision making and independent critical care time    Summary of Assessment and Plan   33 year old female with past medical history of HIV who recently started on ART, bronchiectasis who had a recent admission on 12/17 for pneumonia and subsequently got discharged home to complete antibiotic course with cefadroxil .  She had a right sided pneumonia and presents with fevers.  Her kids were diagnosed with influenza A. On presentation her blood pressure was borderline and hence she was admitted to ICU.  She never needed pressors.  Her blood pressure is better.  She is receiving 1 L fluid bolus currently.  Her last blood pressure was 94/72.  At home her blood pressure usually runs in systolic of 100s.  Pertinent Physical Exam:  General: Young female who is significantly underweight. Lungs: clear to auscultation bilaterally.  Heart: regular rate rhythm, no murmur appreciated.  Abdomen: non tender, non distended. Normal BS.  Neuro: axox 3.  Moving all extremities  Labs and Radiology reviewed. Cortisol 10.8, chest x-ray with significant improvement in right lower lobe pneumonia.  A/P:  Flu A: Resolving right lower lobe pneumonia: Hypotension now improving: HIV on ART which was recently initiated early in the month.  Last CD4 148. - Low concern for bacterial pneumonia.  Patient has flu A. -Will change cefepime  and doxycycline  to ceftriaxone  and doxycycline .  Tamiflu  has been initiated.  Will complete total 5-day course of antibiotics. -Currently finishing off her fluid bolus.  Blood pressure is holding stable.  Midodrine  discontinued. -Continue home ART and dapsone  for PJP prophylaxis.  Will be transferred to floor.   Total care time:  40 minutes   Care time was exclusive of separately billable procedures and treating other patients.  Care was necessary to treat or prevent imminent or life-threatening deterioration.   Care was time spent personally by me on the following activities: development of treatment plan with patient and/or surrogate as well as nursing, discussions with consultants, evaluation of patient's response to treatment, examination of patient, obtaining history from patient or surrogate, ordering and performing treatments and interventions, ordering and review of laboratory studies, ordering and review of radiographic studies and pulse oximetry.   Sammi JONETTA Fredericks, MD Pulmonary, Critical Care and Sleep Attending.   02/01/2024, 4:55 PM

## 2024-02-01 NOTE — ED Notes (Signed)
 Report given to charge in [place] and aware patient is coming up

## 2024-02-02 ENCOUNTER — Encounter (HOSPITAL_COMMUNITY): Payer: Self-pay | Admitting: Critical Care Medicine

## 2024-02-02 DIAGNOSIS — J101 Influenza due to other identified influenza virus with other respiratory manifestations: Secondary | ICD-10-CM | POA: Diagnosis not present

## 2024-02-02 DIAGNOSIS — I959 Hypotension, unspecified: Secondary | ICD-10-CM | POA: Diagnosis not present

## 2024-02-02 DIAGNOSIS — Z21 Asymptomatic human immunodeficiency virus [HIV] infection status: Secondary | ICD-10-CM

## 2024-02-02 MED ORDER — DOXYCYCLINE HYCLATE 100 MG PO TABS
100.0000 mg | ORAL_TABLET | Freq: Two times a day (BID) | ORAL | Status: AC
  Administered 2024-02-02 – 2024-02-03 (×3): 100 mg via ORAL
  Filled 2024-02-02 (×3): qty 1

## 2024-02-02 MED ORDER — BICTEGRAVIR-EMTRICITAB-TENOFOV 50-200-25 MG PO TABS
1.0000 | ORAL_TABLET | Freq: Every day | ORAL | Status: DC
  Administered 2024-02-02 – 2024-02-03 (×2): 1 via ORAL
  Filled 2024-02-02 (×2): qty 1

## 2024-02-02 MED ORDER — SODIUM CHLORIDE 0.9 % IV SOLN
2.0000 g | INTRAVENOUS | Status: DC
  Administered 2024-02-02 – 2024-02-03 (×2): 2 g via INTRAVENOUS
  Filled 2024-02-02 (×2): qty 20

## 2024-02-02 MED ORDER — MIDODRINE HCL 5 MG PO TABS
5.0000 mg | ORAL_TABLET | Freq: Three times a day (TID) | ORAL | Status: DC
  Administered 2024-02-02 – 2024-02-04 (×6): 5 mg via ORAL
  Filled 2024-02-02 (×6): qty 1

## 2024-02-02 NOTE — Progress Notes (Signed)
 "                        PROGRESS NOTE        PATIENT DETAILS Name: Frances Ball Age: 33 y.o. Sex: female Date of Birth: [birth year] Admit Date: 01/31/2024 Admitting Physician Harlene Na, DO ERE:Ipknw, Corean SAILOR, NP  Brief Summary: Patient is a 33 y.o.  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  She was briefly admitted to the ICU-however did not require pressors-her blood pressure stabilized with IV fluids-she was then transferred to [facility].  Significant events: 12/30>> admit to ICU-severe sepsis with hypotension 12/31>> transferred to [facility]  Significant studies: 12/29>> CXR: Persistent but improved patchy opacity right middle/lower lobe  Significant microbiology data: 12/29>> influenza A PCR: Positive 12/29>> COVID/influenza B/RSV PCR: Negative 12/29>> blood culture: No growth 12/30>> blood culture: No growth  Procedures: None  Consults: None  Subjective: Feels somewhat better-lying comfortably in bed-no nausea or vomiting.  On room air.  Objective: Vitals: Blood pressure (!) 83/70, pulse 86, temperature 97.8 F (36.6 C), temperature source Oral, resp. rate 12, height 5' 2 (1.575 m), weight 43.7 kg, SpO2 92%.   Exam: Gen Exam:Alert awake-not in any distress HEENT:atraumatic, normocephalic Chest: B/L clear to auscultation anteriorly CVS:S1S2 regular Abdomen:soft non tender, non distended Extremities:no edema Neurology: Non focal Skin: no rash  Pertinent Labs/Radiology:    Latest Ref Rng & Units 02/01/2024    6:14 AM 01/31/2024    9:09 PM 01/16/2024    3:29 PM  CBC  WBC 4.0 - 10.5 K/uL 6.2  8.2    Hemoglobin 12.0 - 15.0 g/dL  8.2  89.5  86.3   Hematocrit 36.0 - 46.0 % 26.5  33.3  40.0   Platelets 150 - 400 K/uL 132  224      Lab Results  Component Value Date   NA 136 02/01/2024   K 3.6 02/01/2024   CL 100 02/01/2024   CO2 24 02/01/2024      Assessment/Plan: Severe sepsis Likely secondary to influenza infection-superimposed on recent PNA Continue Tamiflu  Continue empiric antibiotics Follow cultures.  Recent PNA S/p bronchoscopy with BAL-all cultures negative Discharged recently on cefadroxil -which she has completed-currently on Rocephin /doxycycline .  HIV/AIDS Continue ART Dapsone  for PJP prophylaxis  History of HCV S/p treatment-recent viral load on 12/16-not detectable.  Hypotension Appears to have chronic hypotension/soft BP per prior notes Recent a.m. cortisol on 12/30 stable Watch closely-currently asymptomatic  Normocytic anemia Probably secondary to underlying HIV/chronic disease/menstrual blood loss.  Multiple lung nodules Seen on CT chest on 12/14-needs outpatient CT chest at 3 to 6 months.  Underweight: Estimated body mass index is 17.62 kg/m as calculated from the following:   Height as of this encounter: 5' 2 (1.575 m).   Weight as of this encounter: 43.7 kg.   Code status:   Code Status: Full Code   DVT Prophylaxis: SQ Lovenox   Family Communication: None at bedside   Disposition Plan: Status is: Inpatient Remains inpatient appropriate because: Severity of illness   Planned Discharge Destination:Home   Diet: Diet Order             Diet regular Room service appropriate? Yes; Fluid consistency: Thin  Diet effective now  Antimicrobial agents: Anti-infectives (From admission, onward)    Start     Dose/Rate Route Frequency Ordered Stop   02/01/24 2200  oseltamivir  (TAMIFLU ) capsule 30 mg  Status:  Discontinued       Note to Pharmacy: Tamiflu  30 mg BID for CrCl <  60 mL/min   30 mg Oral 2 times daily 02/01/24 0602 02/01/24 1430    02/01/24 1745  cefTRIAXone  (ROCEPHIN ) 1 g in sodium chloride  0.9 % 100 mL IVPB        1 g 200 mL/hr over 30 Minutes Intravenous Every 24 hours 02/01/24 1653     02/01/24 1530  oseltamivir  (TAMIFLU ) capsule 75 mg        75 mg Oral 2 times daily 02/01/24 1430 02/06/24 0959   02/01/24 1000  dapsone  tablet 100 mg        100 mg Oral Daily 02/01/24 0554     02/01/24 1000  bictegravir-emtricitabine -tenofovir  AF (BIKTARVY ) 50-200-25 MG per tablet 1 tablet        1 tablet Oral Daily 02/01/24 0555     02/01/24 0615  oseltamivir  (TAMIFLU ) capsule 75 mg  Status:  Discontinued        75 mg Oral  Once 02/01/24 0558 02/01/24 1430   02/01/24 0615  linezolid  (ZYVOX ) IVPB 600 mg  Status:  Discontinued        600 mg 300 mL/hr over 60 Minutes Intravenous Every 12 hours 02/01/24 0614 02/01/24 1204   02/01/24 0600  doxycycline  (VIBRAMYCIN ) 100 mg in sodium chloride  0.9 % 250 mL IVPB        100 mg 125 mL/hr over 120 Minutes Intravenous Every 12 hours 02/01/24 0546     02/01/24 0600  ceFEPIme  (MAXIPIME ) 2 g in sodium chloride  0.9 % 100 mL IVPB  Status:  Discontinued        2 g 200 mL/hr over 30 Minutes Intravenous Every 12 hours 02/01/24 0556 02/01/24 1653   01/31/24 2330  oseltamivir  (TAMIFLU ) capsule 75 mg  Status:  Discontinued        75 mg Oral  Once 01/31/24 2327 02/01/24 0601        MEDICATIONS: Scheduled Meds:  bictegravir-emtricitabine -tenofovir  AF  1 tablet Oral Daily   Chlorhexidine  Gluconate Cloth  6 each Topical Daily   dapsone   100 mg Oral Daily   enoxaparin  (LOVENOX ) injection  20 mg Subcutaneous Q24H   feeding supplement  1 Container Oral TID BM   oseltamivir   75 mg Oral BID   Continuous Infusions:  cefTRIAXone  (ROCEPHIN )  IV 1 g (02/01/24 1727)   doxycycline  (VIBRAMYCIN ) IV 100 mg (02/02/24 0813)   PRN Meds:.acetaminophen , docusate sodium , ipratropium-albuterol , polyethylene glycol   I have personally reviewed following labs and imaging studies  LABORATORY DATA: CBC: Recent  Labs  Lab 01/31/24 2109 02/01/24 0614  WBC 8.2 6.2  NEUTROABS 6.6  --   HGB 10.4* 8.2*  HCT 33.3* 26.5*  MCV 82.8 82.6  PLT 224 132*    Basic Metabolic Panel: Recent Labs  Lab 01/31/24 2109 02/01/24 0614  NA 133* 136  K 3.6 3.6  CL 92* 100  CO2 21* 24  GLUCOSE 75 79  BUN 11 8  CREATININE 1.05* 0.77  CALCIUM 8.3* 7.6*  MG  --  1.7  PHOS  --  2.6    GFR: Estimated Creatinine Clearance: 68.4 mL/min (by C-G formula based on SCr of 0.77 mg/dL).  Liver Function Tests: Recent Labs  Lab 01/31/24 2109  AST 39  ALT 7  ALKPHOS 48  BILITOT 0.4  PROT 7.6  ALBUMIN  4.0   No results for input(s): LIPASE, AMYLASE in the last 168 hours. No results for input(s): AMMONIA in the last 168 hours.  Coagulation Profile: Recent Labs  Lab 02/01/24 0614  INR 1.0    Cardiac Enzymes: No results for input(s): CKTOTAL, CKMB, CKMBINDEX, TROPONINI in the last 168 hours.  BNP (last 3 results) No results for input(s): PROBNP in the last 8760 hours.  Lipid Profile: No results for input(s): CHOL, HDL, LDLCALC, TRIG, CHOLHDL, LDLDIRECT in the last 72 hours.  Thyroid  Function Tests: No results for input(s): TSH, T4TOTAL, FREET4, T3FREE, THYROIDAB in the last 72 hours.  Anemia Panel: No results for input(s): VITAMINB12, FOLATE, FERRITIN, TIBC, IRON, RETICCTPCT in the last 72 hours.  Urine analysis:    Component Value Date/Time   COLORURINE YELLOW 08/31/2023 0257   APPEARANCEUR CLEAR 08/31/2023 0257   LABSPEC 1.017 08/31/2023 0257   PHURINE 5.0 08/31/2023 0257   GLUCOSEU NEGATIVE 08/31/2023 0257   HGBUR NEGATIVE 08/31/2023 0257   BILIRUBINUR NEGATIVE 08/31/2023 0257   KETONESUR 5 (A) 08/31/2023 0257   PROTEINUR NEGATIVE 08/31/2023 0257   UROBILINOGEN 0.2 09/19/2009 0914   NITRITE NEGATIVE 08/31/2023 0257   LEUKOCYTESUR TRACE (A) 08/31/2023 0257    Sepsis Labs: Lactic Acid, Venous    Component Value Date/Time    LATICACIDVEN 1.0 02/01/2024 0013    MICROBIOLOGY: Recent Results (from the past 240 hours)  Resp panel by RT-PCR (RSV, Flu A&B, Covid) Anterior Nasal Swab     Status: Abnormal   Collection Time: 01/31/24  8:56 PM   Specimen: Anterior Nasal Swab  Result Value Ref Range Status   SARS Coronavirus 2 by RT PCR NEGATIVE NEGATIVE Final   Influenza A by PCR POSITIVE (A) NEGATIVE Final   Influenza B by PCR NEGATIVE NEGATIVE Final    Comment: (NOTE) The Xpert Xpress SARS-CoV-2/FLU/RSV plus assay is intended as an aid in the diagnosis of influenza from Nasopharyngeal swab specimens and should not be used as a sole basis for treatment. Nasal washings and aspirates are unacceptable for Xpert Xpress SARS-CoV-2/FLU/RSV testing.  Fact Sheet for Patients: bloggercourse.com  Fact Sheet for Healthcare Providers: seriousbroker.it  This test is not yet approved or cleared by the [place]  FDA and has been authorized for detection and/or diagnosis of SARS-CoV-2 by FDA under an Emergency Use Authorization (EUA). This EUA will remain in effect (meaning this test can be used) for the duration of the COVID-19 declaration under Section 564(b)(1) of the Act, 21 U.S.C. section 360bbb-3(b)(1), unless the authorization is terminated or revoked.     Resp Syncytial Virus by PCR NEGATIVE NEGATIVE Final    Comment: (NOTE) Fact Sheet for Patients: bloggercourse.com  Fact Sheet for Healthcare Providers: seriousbroker.it  This test is not yet approved or cleared by the [place]  FDA and has been authorized for detection and/or diagnosis of SARS-CoV-2 by FDA under an Emergency Use Authorization (EUA). This EUA will remain in effect (meaning this test can be used) for the duration of the COVID-19 declaration under Section 564(b)(1) of the Act, 21 U.S.C. section 360bbb-3(b)(1), unless the authorization is  terminated or revoked.  Performed at [facility], [address], [place], [place] [address]   Culture, blood (Routine x 2)     Status: None (Preliminary result)   Collection Time: 01/31/24  9:09 PM   Specimen: BLOOD  Result Value Ref Range Status   Specimen Description BLOOD SITE NOT SPECIFIED  Final  Special Requests   Final    BOTTLES DRAWN AEROBIC AND ANAEROBIC Blood Culture results may not be optimal due to an inadequate volume of blood received in culture bottles   Culture   Final    NO GROWTH 2 DAYS Performed at [facility], [address] [place], [place] [address]    Report Status PENDING  Incomplete  Culture, blood (Routine x 2)     Status: None (Preliminary result)   Collection Time: 02/01/24 12:02 AM   Specimen: BLOOD  Result Value Ref Range Status   Specimen Description BLOOD SITE NOT SPECIFIED  Final   Special Requests   Final    BOTTLES DRAWN AEROBIC AND ANAEROBIC Blood Culture results may not be optimal due to an inadequate volume of blood received in culture bottles   Culture   Final    NO GROWTH 1 DAY Performed at [facility], [address] [place], [place] [address]    Report Status PENDING  Incomplete  MRSA Next Gen by PCR, Nasal     Status: None   Collection Time: 02/01/24  5:47 AM   Specimen: Nasal Mucosa; Nasal Swab  Result Value Ref Range Status   MRSA by PCR Next Gen NOT DETECTED NOT DETECTED Final    Comment: (NOTE) The GeneXpert MRSA Assay (FDA approved for NASAL specimens only), is one component of a comprehensive MRSA colonization surveillance program. It is not intended to diagnose MRSA infection nor to guide or monitor treatment for MRSA infections. Test performance is not FDA approved in patients less than 38 years old. Performed at [facility], [address], [place], [place] [address]     RADIOLOGY STUDIES/RESULTS: DG Chest 2 View Result Date: 01/31/2024 CLINICAL DATA:  Flu-like symptoms. EXAM: CHEST -  2 VIEW COMPARISON:  Radiograph and CT 01/16/2024 FINDINGS: Persistent but improved patchy opacity in the right middle and lower lobe. There may be a trace right pleural effusion. No new or progressive airspace disease. Streaky opacities in the medial left lung base correspond to bronchiectasis on CT. No pneumothorax. The heart is normal in size. IMPRESSION: Persistent but improved patchy opacity in the right middle and lower lobe over the last 15 days. There may be a trace right pleural effusion. Electronically Signed   By: Andrea Gasman M.D.   On: 01/31/2024 22:18     LOS: 1 day   Donalda Applebaum, MD  [facility]    To contact the attending provider between 7A-7P or the covering provider during after hours 7P-7A, please log into the web site [email or web address] and access using universal [place] password for that web site. If you do not have the password, please call the hospital operator.  02/02/2024, 9:33 AM    "

## 2024-02-02 NOTE — Plan of Care (Signed)

## 2024-02-03 DIAGNOSIS — J101 Influenza due to other identified influenza virus with other respiratory manifestations: Secondary | ICD-10-CM | POA: Diagnosis not present

## 2024-02-03 DIAGNOSIS — Z21 Asymptomatic human immunodeficiency virus [HIV] infection status: Secondary | ICD-10-CM | POA: Diagnosis not present

## 2024-02-03 DIAGNOSIS — I959 Hypotension, unspecified: Secondary | ICD-10-CM | POA: Diagnosis not present

## 2024-02-03 DIAGNOSIS — J189 Pneumonia, unspecified organism: Secondary | ICD-10-CM | POA: Diagnosis not present

## 2024-02-03 LAB — BASIC METABOLIC PANEL WITH GFR
Anion gap: 7 (ref 5–15)
BUN: <5 mg/dL — ABNORMAL LOW (ref 6–20)
CO2: 27 mmol/L (ref 22–32)
Calcium: 7.1 mg/dL — ABNORMAL LOW (ref 8.9–10.3)
Chloride: 109 mmol/L (ref 98–111)
Creatinine, Ser: 0.46 mg/dL (ref 0.44–1.00)
GFR, Estimated: >60 mL/min
Glucose, Bld: 86 mg/dL (ref 70–99)
Potassium: 3.2 mmol/L — ABNORMAL LOW (ref 3.5–5.1)
Sodium: 143 mmol/L (ref 135–145)

## 2024-02-03 LAB — CBC
HCT: 26.2 % — ABNORMAL LOW (ref 36.0–46.0)
Hemoglobin: 8.4 g/dL — ABNORMAL LOW (ref 12.0–15.0)
MCH: 25.9 pg — ABNORMAL LOW (ref 26.0–34.0)
MCHC: 32.1 g/dL (ref 30.0–36.0)
MCV: 80.9 fL (ref 80.0–100.0)
Platelets: 161 K/uL (ref 150–400)
RBC: 3.24 MIL/uL — ABNORMAL LOW (ref 3.87–5.11)
RDW: 17 % — ABNORMAL HIGH (ref 11.5–15.5)
WBC: 1.6 K/uL — ABNORMAL LOW (ref 4.0–10.5)
nRBC: 0 % (ref 0.0–0.2)

## 2024-02-03 MED ORDER — POTASSIUM CHLORIDE 20 MEQ PO PACK
40.0000 meq | PACK | Freq: Once | ORAL | Status: AC
  Administered 2024-02-03: 40 meq via ORAL
  Filled 2024-02-03: qty 2

## 2024-02-03 NOTE — Plan of Care (Signed)

## 2024-02-03 NOTE — Progress Notes (Signed)
 "                        PROGRESS NOTE        PATIENT DETAILS Name: Frances Ball Age: 34 y.o. Sex: female Date of Birth: 10/07/1990 Admit Date: 01/31/2024 Admitting Physician Harlene Na, DO ERE:Ipknw, Corean SAILOR, NP  Brief Summary: Patient is a 34 y.o.  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  She was briefly admitted to the ICU-however did not require pressors-her blood pressure stabilized with IV fluids-she was then transferred to [facility].  Significant events: 12/30>> admit to ICU-severe sepsis with hypotension 12/31>> transferred to [facility]  Significant studies: 12/29>> CXR: Persistent but improved patchy opacity right middle/lower lobe  Significant microbiology data: 12/29>> influenza A PCR: Positive 12/29>> COVID/influenza B/RSV PCR: Negative 12/29>> blood culture: No growth 12/30>> blood culture: No growth  Procedures: None  Consults: None  Subjective: No major issues overnight-claims her breathing/cough has improved.  She is ambulating in the room okay but has not yet ambulated in the hallway.  Objective: Vitals: Blood pressure 91/74, pulse 88, temperature (!) 97.5 F (36.4 C), temperature source Oral, resp. rate 14, height 5' 2 (1.575 m), weight 45.4 kg, SpO2 93%.   Exam: Gen Exam:Alert awake-not in any distress HEENT:atraumatic, normocephalic Chest: B/L clear to auscultation anteriorly CVS:S1S2 regular Abdomen:soft non tender, non distended Extremities:no edema Neurology: Non focal Skin: no rash  Pertinent Labs/Radiology:    Latest Ref Rng & Units 02/03/2024    8:33 AM 02/01/2024    6:14 AM 01/31/2024    9:09 PM  CBC   WBC 4.0 - 10.5 K/uL 1.6  6.2  8.2   Hemoglobin 12.0 - 15.0 g/dL 8.4  8.2  89.5   Hematocrit 36.0 - 46.0 % 26.2  26.5  33.3   Platelets 150 - 400 K/uL 161  132  224     Lab Results  Component Value Date   NA 136 02/01/2024   K 3.6 02/01/2024   CL 100 02/01/2024   CO2 24 02/01/2024      Assessment/Plan: Severe sepsis Likely secondary to influenza infection-superimposed on recent PNA Continue Tamiflu  Continue empiric antibiotics Follow cultures.  Recent PNA S/p bronchoscopy with BAL-all cultures negative Discharged recently on cefadroxil -which she has completed-currently on Rocephin /doxycycline .  HIV/AIDS Continue ART Dapsone  for PJP prophylaxis  History of HCV S/p treatment-recent viral load on 12/16-not detectable.  Hypotension Appears to have chronic hypotension/soft BP per prior notes Recent a.m. cortisol on 12/30 stable On low-dose midodrine  Watch closely-currently asymptomatic-ambulating in the room without any dizziness or lightheadedness.  Normocytic anemia Probably secondary to underlying HIV/chronic disease/menstrual blood loss. Follow CBC  Leukopenia ?  Secondary to influenza/viral syndrome Repeat CBC with differential tomorrow morning.  Multiple lung nodules Seen on CT chest on 12/14-needs outpatient CT chest at 3 to 6 months.  Underweight: Estimated body mass index is 18.31 kg/m as calculated from the following:   Height as of this encounter: 5' 2 (1.575 m).   Weight as of this encounter: 45.4 kg.   Code status:   Code Status: Full Code   DVT Prophylaxis: SQ Lovenox   Family Communication: None at bedside   Disposition Plan: Status is: Inpatient Remains inpatient appropriate  because: Severity of illness   Planned Discharge Destination:Home   Diet: Diet Order             Diet regular Room service appropriate? Yes; Fluid consistency: Thin  Diet effective now                     Antimicrobial agents: Anti-infectives (From  admission, onward)    Start     Dose/Rate Route Frequency Ordered Stop   02/02/24 2300  bictegravir-emtricitabine -tenofovir  AF (BIKTARVY ) 50-200-25 MG per tablet 1 tablet        1 tablet Oral Daily at bedtime 02/02/24 2201     02/02/24 2200  doxycycline  (VIBRA -TABS) tablet 100 mg        100 mg Oral Every 12 hours 02/02/24 0946 02/04/24 0959   02/02/24 1745  cefTRIAXone  (ROCEPHIN ) 2 g in sodium chloride  0.9 % 100 mL IVPB        2 g 200 mL/hr over 30 Minutes Intravenous Every 24 hours 02/02/24 0947 02/06/24 1744   02/01/24 2200  oseltamivir  (TAMIFLU ) capsule 30 mg  Status:  Discontinued       Note to Pharmacy: Tamiflu  30 mg BID for CrCl <  60 mL/min   30 mg Oral 2 times daily 02/01/24 0602 02/01/24 1430   02/01/24 1745  cefTRIAXone  (ROCEPHIN ) 1 g in sodium chloride  0.9 % 100 mL IVPB  Status:  Discontinued        1 g 200 mL/hr over 30 Minutes Intravenous Every 24 hours 02/01/24 1653 02/02/24 0947   02/01/24 1530  oseltamivir  (TAMIFLU ) capsule 75 mg        75 mg Oral 2 times daily 02/01/24 1430 02/06/24 0959   02/01/24 1000  dapsone  tablet 100 mg        100 mg Oral Daily 02/01/24 0554     02/01/24 1000  bictegravir-emtricitabine -tenofovir  AF (BIKTARVY ) 50-200-25 MG per tablet 1 tablet  Status:  Discontinued        1 tablet Oral Daily 02/01/24 0555 02/02/24 2201   02/01/24 0615  oseltamivir  (TAMIFLU ) capsule 75 mg  Status:  Discontinued        75 mg Oral  Once 02/01/24 0558 02/01/24 1430   02/01/24 0615  linezolid  (ZYVOX ) IVPB 600 mg  Status:  Discontinued        600 mg 300 mL/hr over 60 Minutes Intravenous Every 12 hours 02/01/24 0614 02/01/24 1204   02/01/24 0600  doxycycline  (VIBRAMYCIN ) 100 mg in sodium chloride  0.9 % 250 mL IVPB  Status:  Discontinued        100 mg 125 mL/hr over 120 Minutes Intravenous Every 12 hours 02/01/24 0546 02/02/24 0946   02/01/24 0600  ceFEPIme  (MAXIPIME ) 2 g in sodium chloride  0.9 % 100 mL IVPB  Status:  Discontinued        2 g 200 mL/hr over 30 Minutes  Intravenous Every 12 hours 02/01/24 0556 02/01/24 1653   01/31/24 2330  oseltamivir  (TAMIFLU ) capsule 75 mg  Status:  Discontinued        75 mg Oral  Once 01/31/24 2327 02/01/24 0601        MEDICATIONS: Scheduled Meds:  bictegravir-emtricitabine -tenofovir  AF  1 tablet Oral QHS   Chlorhexidine  Gluconate Cloth  6 each Topical Daily   dapsone   100 mg Oral Daily   doxycycline   100 mg Oral Q12H   enoxaparin  (LOVENOX ) injection  20 mg Subcutaneous Q24H   feeding supplement  1 Container Oral TID BM   midodrine   5  mg Oral TID WC   oseltamivir   75 mg Oral BID   Continuous Infusions:  cefTRIAXone  (ROCEPHIN )  IV 2 g (02/02/24 1714)   PRN Meds:.acetaminophen , docusate sodium , ipratropium-albuterol , polyethylene glycol   I have personally reviewed following labs and imaging studies  LABORATORY DATA: CBC: Recent Labs  Lab 01/31/24 2109 02/01/24 0614 02/03/24 0833  WBC 8.2 6.2 1.6*  NEUTROABS 6.6  --   --   HGB 10.4* 8.2* 8.4*  HCT 33.3* 26.5* 26.2*  MCV 82.8 82.6 80.9  PLT 224 132* 161    Basic Metabolic Panel: Recent Labs  Lab 01/31/24 2109 02/01/24 0614  NA 133* 136  K 3.6 3.6  CL 92* 100  CO2 21* 24  GLUCOSE 75 79  BUN 11 8  CREATININE 1.05* 0.77  CALCIUM 8.3* 7.6*  MG  --  1.7  PHOS  --  2.6    GFR: Estimated Creatinine Clearance: 71 mL/min (by C-G formula based on SCr of 0.77 mg/dL).  Liver Function Tests: Recent Labs  Lab 01/31/24 2109  AST 39  ALT 7  ALKPHOS 48  BILITOT 0.4  PROT 7.6  ALBUMIN  4.0   No results for input(s): LIPASE, AMYLASE in the last 168 hours. No results for input(s): AMMONIA in the last 168 hours.  Coagulation Profile: Recent Labs  Lab 02/01/24 0614  INR 1.0    Cardiac Enzymes: No results for input(s): CKTOTAL, CKMB, CKMBINDEX, TROPONINI in the last 168 hours.  BNP (last 3 results) No results for input(s): PROBNP in the last 8760 hours.  Lipid Profile: No results for input(s): CHOL, HDL,  LDLCALC, TRIG, CHOLHDL, LDLDIRECT in the last 72 hours.  Thyroid  Function Tests: No results for input(s): TSH, T4TOTAL, FREET4, T3FREE, THYROIDAB in the last 72 hours.  Anemia Panel: No results for input(s): VITAMINB12, FOLATE, FERRITIN, TIBC, IRON, RETICCTPCT in the last 72 hours.  Urine analysis:    Component Value Date/Time   COLORURINE YELLOW 08/31/2023 0257   APPEARANCEUR CLEAR 08/31/2023 0257   LABSPEC 1.017 08/31/2023 0257   PHURINE 5.0 08/31/2023 0257   GLUCOSEU NEGATIVE 08/31/2023 0257   HGBUR NEGATIVE 08/31/2023 0257   BILIRUBINUR NEGATIVE 08/31/2023 0257   KETONESUR 5 (A) 08/31/2023 0257   PROTEINUR NEGATIVE 08/31/2023 0257   UROBILINOGEN 0.2 09/19/2009 0914   NITRITE NEGATIVE 08/31/2023 0257   LEUKOCYTESUR TRACE (A) 08/31/2023 0257    Sepsis Labs: Lactic Acid, Venous    Component Value Date/Time   LATICACIDVEN 1.0 02/01/2024 0013    MICROBIOLOGY: Recent Results (from the past 240 hours)  Resp panel by RT-PCR (RSV, Flu A&B, Covid) Anterior Nasal Swab     Status: Abnormal   Collection Time: 01/31/24  8:56 PM   Specimen: Anterior Nasal Swab  Result Value Ref Range Status   SARS Coronavirus 2 by RT PCR NEGATIVE NEGATIVE Final   Influenza A by PCR POSITIVE (A) NEGATIVE Final   Influenza B by PCR NEGATIVE NEGATIVE Final    Comment: (NOTE) The Xpert Xpress SARS-CoV-2/FLU/RSV plus assay is intended as an aid in the diagnosis of influenza from Nasopharyngeal swab specimens and should not be used as a sole basis for treatment. Nasal washings and aspirates are unacceptable for Xpert Xpress SARS-CoV-2/FLU/RSV testing.  Fact Sheet for Patients: bloggercourse.com  Fact Sheet for Healthcare Providers: seriousbroker.it  This test is not yet approved or cleared by the [place]  FDA and has been authorized for detection and/or diagnosis of SARS-CoV-2 by FDA under an Emergency Use  Authorization (EUA). This EUA will remain in  effect (meaning this test can be used) for the duration of the COVID-19 declaration under Section 564(b)(1) of the Act, 21 U.S.C. section 360bbb-3(b)(1), unless the authorization is terminated or revoked.     Resp Syncytial Virus by PCR NEGATIVE NEGATIVE Final    Comment: (NOTE) Fact Sheet for Patients: bloggercourse.com  Fact Sheet for Healthcare Providers: seriousbroker.it  This test is not yet approved or cleared by the [place]  FDA and has been authorized for detection and/or diagnosis of SARS-CoV-2 by FDA under an Emergency Use Authorization (EUA). This EUA will remain in effect (meaning this test can be used) for the duration of the COVID-19 declaration under Section 564(b)(1) of the Act, 21 U.S.C. section 360bbb-3(b)(1), unless the authorization is terminated or revoked.  Performed at [facility], [address], [place], [place] [address]   Culture, blood (Routine x 2)     Status: None (Preliminary result)   Collection Time: 01/31/24  9:09 PM   Specimen: BLOOD  Result Value Ref Range Status   Specimen Description BLOOD SITE NOT SPECIFIED  Final   Special Requests   Final    BOTTLES DRAWN AEROBIC AND ANAEROBIC Blood Culture results may not be optimal due to an inadequate volume of blood received in culture bottles   Culture   Final    NO GROWTH 3 DAYS Performed at [facility], [address], [place], [place] [address]    Report Status PENDING  Incomplete  Culture, blood (Routine x 2)     Status: None (Preliminary result)   Collection Time: 02/01/24 12:02 AM   Specimen: BLOOD  Result Value Ref Range Status   Specimen Description BLOOD SITE NOT SPECIFIED  Final   Special Requests   Final    BOTTLES DRAWN AEROBIC AND ANAEROBIC Blood Culture results may not be optimal due to an inadequate volume of blood received in culture bottles   Culture   Final    NO  GROWTH 2 DAYS Performed at [facility], [address], [place], [place] [address]    Report Status PENDING  Incomplete  MRSA Next Gen by PCR, Nasal     Status: None   Collection Time: 02/01/24  5:47 AM   Specimen: Nasal Mucosa; Nasal Swab  Result Value Ref Range Status   MRSA by PCR Next Gen NOT DETECTED NOT DETECTED Final    Comment: (NOTE) The GeneXpert MRSA Assay (FDA approved for NASAL specimens only), is one component of a comprehensive MRSA colonization surveillance program. It is not intended to diagnose MRSA infection nor to guide or monitor treatment for MRSA infections. Test performance is not FDA approved in patients less than 14 years old. Performed at [facility], [address], [place], [place] [address]     RADIOLOGY STUDIES/RESULTS: No results found.    LOS: 2 days   Donalda Applebaum, MD  [facility]    To contact the attending provider between 7A-7P or the covering provider during after hours 7P-7A, please log into the web site [email or web address] and access using universal [place] password for that web site. If you do not have the password, please call the hospital operator.  02/03/2024, 10:06 AM    "

## 2024-02-04 DIAGNOSIS — J189 Pneumonia, unspecified organism: Secondary | ICD-10-CM | POA: Diagnosis not present

## 2024-02-04 DIAGNOSIS — Z21 Asymptomatic human immunodeficiency virus [HIV] infection status: Secondary | ICD-10-CM | POA: Diagnosis not present

## 2024-02-04 DIAGNOSIS — I959 Hypotension, unspecified: Secondary | ICD-10-CM | POA: Diagnosis not present

## 2024-02-04 DIAGNOSIS — J101 Influenza due to other identified influenza virus with other respiratory manifestations: Secondary | ICD-10-CM | POA: Diagnosis not present

## 2024-02-04 LAB — CBC WITH DIFFERENTIAL/PLATELET
Basophils Absolute: 0 K/uL (ref 0.0–0.1)
Basophils Relative: 1 %
Eosinophils Absolute: 0 K/uL (ref 0.0–0.5)
Eosinophils Relative: 0 %
HCT: 27.1 % — ABNORMAL LOW (ref 36.0–46.0)
Hemoglobin: 8.6 g/dL — ABNORMAL LOW (ref 12.0–15.0)
Lymphocytes Relative: 9 %
Lymphs Abs: 0.2 K/uL — ABNORMAL LOW (ref 0.7–4.0)
MCH: 26 pg (ref 26.0–34.0)
MCHC: 31.7 g/dL (ref 30.0–36.0)
MCV: 81.9 fL (ref 80.0–100.0)
Monocytes Absolute: 0.2 K/uL (ref 0.1–1.0)
Monocytes Relative: 11 %
Neutro Abs: 1.3 K/uL — ABNORMAL LOW (ref 1.7–7.7)
Neutrophils Relative %: 79 %
Platelets: 206 K/uL (ref 150–400)
RBC: 3.31 MIL/uL — ABNORMAL LOW (ref 3.87–5.11)
RDW: 16.7 % — ABNORMAL HIGH (ref 11.5–15.5)
WBC: 1.7 K/uL — ABNORMAL LOW (ref 4.0–10.5)
nRBC: 0 % (ref 0.0–0.2)

## 2024-02-04 MED ORDER — SODIUM CHLORIDE 0.9 % IV SOLN
2.0000 g | Freq: Once | INTRAVENOUS | Status: AC
  Administered 2024-02-04: 2 g via INTRAVENOUS
  Filled 2024-02-04: qty 20

## 2024-02-04 MED ORDER — MIDODRINE HCL 5 MG PO TABS: 5.0000 mg | ORAL_TABLET | Freq: Two times a day (BID) | ORAL | 0 refills | Status: AC

## 2024-02-04 NOTE — Plan of Care (Signed)

## 2024-02-04 NOTE — Progress Notes (Signed)
 Reviewed AVS, patient expressed understanding of medications, MD follow up reviewed.   See LDA for information on wounds at discharge. Patient states all belongings brought to the hospital at time of admission are accounted for and packed to take home.  Patient informed and expressed understanding where to pick up discharge medications.  [facility] wiil be contacted to transport patient to Discharge lounge to wait for transportation home after IV medication finishes running.

## 2024-02-04 NOTE — Discharge Summary (Signed)
 "  PATIENT DETAILS Name: Frances Ball Age: 34 y.o. Sex: female Date of Birth: Dec 07, 1990 MRN: [id number]. Admitting Physician: Harlene Na, DO ERE:Ipknw, Corean SAILOR, NP  Admit Date: 01/31/2024 Discharge date: 02/04/2024  Recommendations for Outpatient Follow-up:  Follow up with PCP in 1-2 weeks Please obtain CMP/CBC in one week Please ensure follow-up with infectious disease clinic. Needs repeat CT chest-see below.  Admitted From:  Home  Disposition: Home   Discharge Condition: good  CODE STATUS:   Code Status: Full Code   Diet recommendation:  Diet Order             Diet regular Room service appropriate? Yes; Fluid consistency: Thin  Diet effective now                    Brief Summary: Patient is a 34 y.o.  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  She was briefly admitted to the ICU-however did not require pressors-her blood pressure stabilized with IV fluids-she was then transferred to [facility].   Significant events: 12/30>> admit to ICU-severe sepsis with hypotension 12/31>> transferred to [facility]   Significant studies: 12/29>> CXR: Persistent but improved patchy opacity right middle/lower lobe   Significant microbiology data: 12/29>> influenza A PCR: Positive 12/29>> COVID/influenza B/RSV PCR: Negative 12/29>> blood culture: No growth 12/30>> blood culture: No growth   Procedures: None   Consults: PCCM   Brief Hospital Course: Severe sepsis Likely secondary to influenza infection-superimposed on recent PNA Treated with Tamiflu  (but patient refused further dosage) and a course of empiric Rocephin  and doxycycline  x 5  days-and does not need any further antibiotics on discharge. Clinically improved-sepsis physiology has resolved-all cultures negative-she feels good enough to go home and is requesting discharge today-hence will be discharged home in a stable manner.  Recent PNA S/p bronchoscopy with BAL-all cultures negative Discharged recently on cefadroxil -which she has completed- Rocephin /doxycycline  x 5 days which will be completed on 1/2.   HIV/AIDS Continue ART Dapsone  for PJP prophylaxis   History of HCV S/p treatment-recent viral load on 12/16-not detectable.   Hypotension Appears to have chronic hypotension/soft BP per prior notes Recent a.m. cortisol on 12/30 stable Continue low-dose midodrine  on discharge-ambulating in the room without any symptoms of lightheadedness or dizziness.    Normocytic anemia Probably secondary to underlying HIV/chronic disease/menstrual blood loss. Follow CBC   Leukopenia ?  Secondary to influenza/viral syndrome Discussed with patient-and then with the infectious disease team over secure chat-she has an upcoming appointment in the ID clinic on 1/7-CBC will be repeated then-hopefully WBC will improve.   Multiple lung nodules Seen on CT chest on 12/14-needs outpatient CT chest at 3 to 6 months.   Underweight: Estimated body mass index is 18.31 kg/m as calculated from the following:   Height as of this encounter: 5' 2 (1.575 m).   Weight as of this encounter: 45.4 kg.    Discharge Diagnoses:  Principal Problem:   Pneumonia   Discharge Instructions:  Activity:  As tolerated   Discharge Instructions     Call MD for:  difficulty breathing, headache or visual disturbances   Complete by: As directed    Call MD for:  extreme fatigue   Complete by: As directed  Call MD for:  persistant dizziness or light-headedness   Complete by: As directed    Discharge instructions   Complete by: As directed    Follow with Primary MD  Melvenia Corean SAILOR, NP in  1-2 weeks  Please get a complete blood count and chemistry panel checked by your Primary MD at your next visit, and again as instructed by your Primary MD.  Get Medicines reviewed and adjusted: Please take all your medications with you for your next visit with your Primary MD  Laboratory/radiological data: Please request your Primary MD to go over all hospital tests and procedure/radiological results at the follow up, please ask your Primary MD to get all Hospital records sent to his/her office.  In some cases, they will be blood work, cultures and biopsy results pending at the time of your discharge. Please request that your primary care M.D. follows up on these results.  Also Note the following: If you experience worsening of your admission symptoms, develop shortness of breath, life threatening emergency, suicidal or homicidal thoughts you must seek medical attention immediately by calling 911 or calling your MD immediately  if symptoms less severe.  You must read complete instructions/literature along with all the possible adverse reactions/side effects for all the Medicines you take and that have been prescribed to you. Take any new Medicines after you have completely understood and accpet all the possible adverse reactions/side effects.   Do not drive when taking Pain medications or sleeping medications (Benzodaizepines)  Do not take more than prescribed Pain, Sleep and Anxiety Medications. It is not advisable to combine anxiety,sleep and pain medications without talking with your primary care practitioner  Special Instructions: If you have smoked or chewed Tobacco  in the last 2 yrs please stop smoking, stop any regular Alcohol  and or any Recreational drug use.  Wear Seat belts while driving.  Please note: You were cared for by a hospitalist during your hospital stay. Once you are discharged, your primary care physician will handle any further medical issues. Please note that NO  REFILLS for any discharge medications will be authorized once you are discharged, as it is imperative that you return to your primary care physician (or establish a relationship with a primary care physician if you do not have one) for your post hospital discharge needs so that they can reassess your need for medications and monitor your lab values.   Increase activity slowly   Complete by: As directed       Allergies as of 02/04/2024       Reactions   Levaquin [levofloxacin In D5w] Shortness Of Breath, Rash   Sulfa Antibiotics Swelling   Facial swelling and burning   Vancomycin  Itching, Swelling, Other (See Comments)   Face swelled turned red with burning and itching   Zithromax [azithromycin] Shortness Of Breath, Swelling, Other (See Comments)   Pt reports tightness in skin with redness   Bactrim [sulfamethoxazole-trimethoprim] Swelling, Other (See Comments)   Pt reports that she was placed on Bactrim for pneumonia when she was d/c from [facility] and developed a red face with swelling and can not tolerate Bactrim.    Darunavir  Rash        Medication List     TAKE these medications    albuterol  108 (90 Base) MCG/ACT inhaler Commonly known as: VENTOLIN  HFA Inhale 2 puffs into the lungs every 6 (six) hours as needed for wheezing or shortness of breath.   aspirin -acetaminophen -caffeine  250-250-65 MG tablet Commonly known  as: EXCEDRIN  MIGRAINE Take 2 tablets by mouth every 6 (six) hours as needed for headache (headache, pain).   Biktarvy  50-200-25 MG Tabs tablet Generic drug: bictegravir-emtricitabine -tenofovir  AF Take 1 tablet by mouth daily.   buprenorphine  8 MG Subl SL tablet Commonly known as: SUBUTEX  Place 8 mg under the tongue 2 (two) times daily as needed (Pain management).   dapsone  100 MG tablet Take 1 tablet (100 mg total) by mouth daily.   midodrine  5 MG tablet Commonly known as: PROAMATINE  Take 1 tablet (5 mg total) by mouth 2 (two) times daily with a  meal.   ondansetron  4 MG tablet Commonly known as: Zofran  Take 1-2 tablets 4 mg by mouth every 8 (eight) hours as needed for nausea or vomiting.        Follow-up Information     Melvenia Corean SAILOR, NP Follow up on 02/09/2024.   Specialty: Infectious Diseases Why: Appointment at 2:30 PM., Repeat Complete Blood Count, Hospital follow up Contact information: [address] [place] [address] [phone]                Allergies[1]   Other Procedures/Studies: DG Chest 2 View Result Date: 01/31/2024 CLINICAL DATA:  Flu-like symptoms. EXAM: CHEST - 2 VIEW COMPARISON:  Radiograph and CT 01/16/2024 FINDINGS: Persistent but improved patchy opacity in the right middle and lower lobe. There may be a trace right pleural effusion. No new or progressive airspace disease. Streaky opacities in the medial left lung base correspond to bronchiectasis on CT. No pneumothorax. The heart is normal in size. IMPRESSION: Persistent but improved patchy opacity in the right middle and lower lobe over the last 15 days. There may be a trace right pleural effusion. Electronically Signed   By: Andrea Gasman M.D.   On: 01/31/2024 22:18   CT Angio Chest PE W and/or Wo Contrast Result Date: 01/16/2024 EXAM: CTA of the Chest with contrast for PE 01/16/2024 06:41:47 PM TECHNIQUE: CTA of the chest was performed after the administration of 75 mL of iohexol  (OMNIPAQUE ) 350 MG/ML injection. Multiplanar reformatted images are provided for review. MIP images are provided for review. Automated exposure control, iterative reconstruction, and/or weight based adjustment of the mA/kV was utilized to reduce the radiation dose to as low as reasonably achievable. COMPARISON: CT chest 08/30/2023. CLINICAL HISTORY: rule out PE FINDINGS: PULMONARY ARTERIES: Pulmonary arteries are adequately opacified for evaluation. No pulmonary embolism. Main pulmonary artery is normal in caliber. MEDIASTINUM: The heart and pericardium  demonstrate no acute abnormality. There is no acute abnormality of the thoracic aorta. LYMPH NODES: Enlarged right hilar lymph nodes measuring up to 13 mm. LUNGS AND PLEURA: Airspace consolidation in the right middle lobe, right lower lobe and minimally in the left lower lobe with air bronchograms. Additional tree-in-bud opacities and patchy airspace opacities are seen throughout the superior segment of the left lower lobe. New nodular densities in the left upper lobe measuring up to 6 mm on image 6/56. Secretion/occlusion of the right lower lobe bronchus. Peribronchial wall thickening in the right lower lobe and right middle lobe. New right upper lobe nodular density measuring 6 mm image 6/39. Calcified granuloma in the right upper lobe. No pleural effusion or pneumothorax. UPPER ABDOMEN: Limited images of the upper abdomen are unremarkable. SOFT TISSUES AND BONES: No acute bone or soft tissue abnormality. IMPRESSION: 1. No evidence of pulmonary embolism. 2. Airspace consolidation in the right middle lobe, right lower lobe, and minimally in the left lower lobe with air bronchograms compatible with  multifocal pneumonia . 3. Additional tree-in-bud opacities and patchy airspace opacities in the superior segment of the left lower lobe compatible with infection . 4. Secretions in the right lower lobe bronchus likely related to aspiration. Follow-up recommended to exclude underlying lesion . 5. Multiple new solid pulmonary nodules up to 6 mm. Recommend non-contrast chest CT at 3-6 months, then consider an additional non-contrast chest CT at 18-24 months per Fleischner Society Guidelines. 6. Enlarged right hilar lymph nodes measuring up to 13 mm. Electronically signed by: Greig Pique MD 01/16/2024 07:08 PM EST RP Workstation: [id number]   DG Chest 2 View Result Date: 01/16/2024 EXAM: 2 VIEW(S) XRAY OF THE CHEST 01/16/2024 03:00:00 PM COMPARISON: 08/30/2023 CLINICAL HISTORY: shob FINDINGS: LUNGS AND PLEURA: There are  trace bilateral pleural effusions. There is right middle lobe airspace consolidation and a small amount of patchy airspace disease in the right lower lobe. No pneumothorax. HEART AND MEDIASTINUM: No acute abnormality of the cardiac and mediastinal silhouettes. BONES AND SOFT TISSUES: No acute osseous abnormality. IMPRESSION: 1. Right middle lobe consolidation with additional patchy airspace disease in the right lower lobe, compatible with pneumonia. Follow-up imaging recommended in 4 to 6 weeks to confirm complete resolution. 2. Trace bilateral pleural effusions. Electronically signed by: Greig Pique MD 01/16/2024 03:09 PM EST RP Workstation: [id number]     TODAY-DAY OF DISCHARGE:  Subjective:   Wafa Martes today has no headache,no chest abdominal pain,no new weakness tingling or numbness, feels much better wants to go home today.   Objective:   Blood pressure 90/71, pulse 86, temperature 98.2 F (36.8 C), temperature source Oral, resp. rate 15, height 5' 2 (1.575 m), weight 45 kg, SpO2 92%. No intake or output data in the 24 hours ending 02/04/24 0951 Filed Weights   02/01/24 1021 02/03/24 0500 02/04/24 0500  Weight: 43.7 kg 45.4 kg 45 kg    Exam: Awake Alert, Oriented *3, No new F.N deficits, Normal affect [place].AT,PERRAL Supple Neck,No JVD, No cervical lymphadenopathy appriciated.  Symmetrical Chest wall movement, Good air movement bilaterally, CTAB RRR,No Gallops,Rubs or new Murmurs, No Parasternal Heave +ve B.Sounds, Abd Soft, Non tender, No organomegaly appriciated, No rebound -guarding or rigidity. No Cyanosis, Clubbing or edema, No new Rash or bruise   PERTINENT RADIOLOGIC STUDIES: No results found.   PERTINENT LAB RESULTS: CBC: Recent Labs    02/03/24 0833 02/04/24 0443  WBC 1.6* 1.7*  HGB 8.4* 8.6*  HCT 26.2* 27.1*  PLT 161 206   CMET CMP     Component Value Date/Time   NA 143 02/03/2024 1033   K 3.2 (L) 02/03/2024 1033   CL 109 02/03/2024 1033   CO2 27  02/03/2024 1033   GLUCOSE 86 02/03/2024 1033   BUN <5 (L) 02/03/2024 1033   CREATININE 0.46 02/03/2024 1033   CREATININE 1.05 03/14/2020 0000   CALCIUM 7.1 (L) 02/03/2024 1033   PROT 7.6 01/31/2024 2109   ALBUMIN  4.0 01/31/2024 2109   AST 39 01/31/2024 2109   ALT 7 01/31/2024 2109   ALT 9 10/07/2017 1508   ALKPHOS 48 01/31/2024 2109   BILITOT 0.4 01/31/2024 2109   GFRNONAA >60 02/03/2024 1033   GFRNONAA 71 03/14/2020 0000    GFR Estimated Creatinine Clearance: 70.4 mL/min (by C-G formula based on SCr of 0.46 mg/dL). No results for input(s): LIPASE, AMYLASE in the last 72 hours. No results for input(s): CKTOTAL, CKMB, CKMBINDEX, TROPONINI in the last 72 hours. Invalid input(s): POCBNP No results for input(s): DDIMER in the last 72 hours. No  results for input(s): HGBA1C in the last 72 hours. No results for input(s): CHOL, HDL, LDLCALC, TRIG, CHOLHDL, LDLDIRECT in the last 72 hours. No results for input(s): TSH, T4TOTAL, T3FREE, THYROIDAB in the last 72 hours.  Invalid input(s): FREET3 No results for input(s): VITAMINB12, FOLATE, FERRITIN, TIBC, IRON, RETICCTPCT in the last 72 hours. Coags: No results for input(s): INR in the last 72 hours.  Invalid input(s): PT Microbiology: Recent Results (from the past 240 hours)  Resp panel by RT-PCR (RSV, Flu A&B, Covid) Anterior Nasal Swab     Status: Abnormal   Collection Time: 01/31/24  8:56 PM   Specimen: Anterior Nasal Swab  Result Value Ref Range Status   SARS Coronavirus 2 by RT PCR NEGATIVE NEGATIVE Final   Influenza A by PCR POSITIVE (A) NEGATIVE Final   Influenza B by PCR NEGATIVE NEGATIVE Final    Comment: (NOTE) The Xpert Xpress SARS-CoV-2/FLU/RSV plus assay is intended as an aid in the diagnosis of influenza from Nasopharyngeal swab specimens and should not be used as a sole basis for treatment. Nasal washings and aspirates are unacceptable for Xpert Xpress  SARS-CoV-2/FLU/RSV testing.  Fact Sheet for Patients: bloggercourse.com  Fact Sheet for Healthcare Providers: seriousbroker.it  This test is not yet approved or cleared by the [place]  FDA and has been authorized for detection and/or diagnosis of SARS-CoV-2 by FDA under an Emergency Use Authorization (EUA). This EUA will remain in effect (meaning this test can be used) for the duration of the COVID-19 declaration under Section 564(b)(1) of the Act, 21 U.S.C. section 360bbb-3(b)(1), unless the authorization is terminated or revoked.     Resp Syncytial Virus by PCR NEGATIVE NEGATIVE Final    Comment: (NOTE) Fact Sheet for Patients: bloggercourse.com  Fact Sheet for Healthcare Providers: seriousbroker.it  This test is not yet approved or cleared by the [place]  FDA and has been authorized for detection and/or diagnosis of SARS-CoV-2 by FDA under an Emergency Use Authorization (EUA). This EUA will remain in effect (meaning this test can be used) for the duration of the COVID-19 declaration under Section 564(b)(1) of the Act, 21 U.S.C. section 360bbb-3(b)(1), unless the authorization is terminated or revoked.  Performed at [facility], [address], [place], [place] [address]   Culture, blood (Routine x 2)     Status: None (Preliminary result)   Collection Time: 01/31/24  9:09 PM   Specimen: BLOOD  Result Value Ref Range Status   Specimen Description BLOOD SITE NOT SPECIFIED  Final   Special Requests   Final    BOTTLES DRAWN AEROBIC AND ANAEROBIC Blood Culture results may not be optimal due to an inadequate volume of blood received in culture bottles   Culture   Final    NO GROWTH 4 DAYS Performed at [facility], [address] [place], [place] [address]    Report Status PENDING  Incomplete  Culture, blood (Routine x 2)     Status: None (Preliminary  result)   Collection Time: 02/01/24 12:02 AM   Specimen: BLOOD  Result Value Ref Range Status   Specimen Description BLOOD SITE NOT SPECIFIED  Final   Special Requests   Final    BOTTLES DRAWN AEROBIC AND ANAEROBIC Blood Culture results may not be optimal due to an inadequate volume of blood received in culture bottles   Culture   Final    NO GROWTH 3 DAYS Performed at [facility], [address], [place], [place] [address]  Report Status PENDING  Incomplete  MRSA Next Gen by PCR, Nasal     Status: None   Collection Time: 02/01/24  5:47 AM   Specimen: Nasal Mucosa; Nasal Swab  Result Value Ref Range Status   MRSA by PCR Next Gen NOT DETECTED NOT DETECTED Final    Comment: (NOTE) The GeneXpert MRSA Assay (FDA approved for NASAL specimens only), is one component of a comprehensive MRSA colonization surveillance program. It is not intended to diagnose MRSA infection nor to guide or monitor treatment for MRSA infections. Test performance is not FDA approved in patients less than 31 years old. Performed at [facility], [address], [place], [place] [address]     FURTHER DISCHARGE INSTRUCTIONS:  Get Medicines reviewed and adjusted: Please take all your medications with you for your next visit with your Primary MD  Laboratory/radiological data: Please request your Primary MD to go over all hospital tests and procedure/radiological results at the follow up, please ask your Primary MD to get all Hospital records sent to his/her office.  In some cases, they will be blood work, cultures and biopsy results pending at the time of your discharge. Please request that your primary care M.D. goes through all the records of your hospital data and follows up on these results.  Also Note the following: If you experience worsening of your admission symptoms, develop shortness of breath, life threatening emergency, suicidal or homicidal thoughts you must seek medical attention  immediately by calling 911 or calling your MD immediately  if symptoms less severe.  You must read complete instructions/literature along with all the possible adverse reactions/side effects for all the Medicines you take and that have been prescribed to you. Take any new Medicines after you have completely understood and accpet all the possible adverse reactions/side effects.   Do not drive when taking Pain medications or sleeping medications (Benzodaizepines)  Do not take more than prescribed Pain, Sleep and Anxiety Medications. It is not advisable to combine anxiety,sleep and pain medications without talking with your primary care practitioner  Special Instructions: If you have smoked or chewed Tobacco  in the last 2 yrs please stop smoking, stop any regular Alcohol  and or any Recreational drug use.  Wear Seat belts while driving.  Please note: You were cared for by a hospitalist during your hospital stay. Once you are discharged, your primary care physician will handle any further medical issues. Please note that NO REFILLS for any discharge medications will be authorized once you are discharged, as it is imperative that you return to your primary care physician (or establish a relationship with a primary care physician if you do not have one) for your post hospital discharge needs so that they can reassess your need for medications and monitor your lab values.  Total Time spent coordinating discharge including counseling, education and face to face time equals greater than 30 minutes.  Signed: Donalda Applebaum 02/04/2024 9:51 AM      [1]  Allergies Allergen Reactions   Levaquin [Levofloxacin In D5w] Shortness Of Breath and Rash   Sulfa Antibiotics Swelling    Facial swelling and burning   Vancomycin  Itching, Swelling and Other (See Comments)    Face swelled turned red with burning and itching   Zithromax [Azithromycin] Shortness Of Breath, Swelling and Other (See Comments)    Pt  reports tightness in skin with redness    Bactrim [Sulfamethoxazole-Trimethoprim] Swelling and Other (See Comments)    Pt reports that she  was placed on Bactrim for pneumonia when she was d/c from [facility] and developed a red face with swelling and can not tolerate Bactrim.    Darunavir  Rash   "

## 2024-02-05 LAB — CULTURE, BLOOD (ROUTINE X 2): Culture: NO GROWTH

## 2024-02-06 LAB — CULTURE, BLOOD (ROUTINE X 2): Culture: NO GROWTH

## 2024-02-09 ENCOUNTER — Inpatient Hospital Stay: Payer: Self-pay | Admitting: Internal Medicine

## 2024-02-18 ENCOUNTER — Telehealth: Payer: Self-pay

## 2024-02-18 NOTE — Telephone Encounter (Signed)
 Attempt to Re-Engage in Care  No office visit or HIV labs completed at [facility] within the last 12 months. Patient is considered out of care.   Last [facility] Visit: 08/28/22  Last HIV Viral Load:  HIV 1 RNA Quant  Date Value Ref Range Status  01/18/2024 316,000 copies/mL Corrected    Comment:    (NOTE) The reportable range for this assay is 20 to 10,000,000 copies HIV-1 RNA/mL.     Last CD4 Count:  CD4 T Cell Abs  Date Value Ref Range Status  01/16/2024 148 (L) 400 - 1,790 /uL Final    Medication Dispense History:   Dispensed Sold Days Supply Quantity Provider Pharmacy  bictegravir-emtricitabine -tenofovir  AF (BIKTARVY ) 50-200-25 MG TABS tablet 01/21/2024 01/21/2024 30 30 tablet Dennise Kingsley, MD [facility]...  bictegravir-emtricitabine -tenofovir  AF (BIKTARVY ) 50-200-25 MG TABS tablet 09/03/2023 09/03/2023 30 30 tablet Krishnan, Gokul, MD [facility]...  BIKTARVY  50/200/25MG  TABLETS 08/09/2023 No sold data 30 30 each Dixon, Corean SAILOR, NP [facility] #...    Interventions: Called [name] to schedule appointment, no answer and voicemail full. MyChart message sent.   Duration of Services: 5 minutes  Roxana Lai, BSN, [facility]

## 2024-02-20 LAB — FUNGAL ORGANISM REFLEX

## 2024-02-20 LAB — FUNGUS CULTURE WITH STAIN

## 2024-02-20 LAB — FUNGUS CULTURE RESULT

## 2024-03-06 LAB — ACID FAST CULTURE WITH REFLEXED SENSITIVITIES (MYCOBACTERIA): Acid Fast Culture: NEGATIVE
